# Patient Record
Sex: Female | Born: 1935 | Race: White | Hispanic: No | Smoking: Never smoker
Health system: Southern US, Community
[De-identification: ages and names within clinical notes are randomized; demographics above are authoritative.]

## PROBLEM LIST (undated history)

## (undated) DIAGNOSIS — H4010X0 Unspecified open-angle glaucoma, stage unspecified: Secondary | ICD-10-CM

## (undated) DIAGNOSIS — R5383 Other fatigue: Secondary | ICD-10-CM

## (undated) DIAGNOSIS — F319 Bipolar disorder, unspecified: Secondary | ICD-10-CM

## (undated) DIAGNOSIS — E860 Dehydration: Secondary | ICD-10-CM

## (undated) DIAGNOSIS — F419 Anxiety disorder, unspecified: Secondary | ICD-10-CM

## (undated) DIAGNOSIS — G629 Polyneuropathy, unspecified: Secondary | ICD-10-CM

## (undated) DIAGNOSIS — I119 Hypertensive heart disease without heart failure: Secondary | ICD-10-CM

## (undated) DIAGNOSIS — C73 Malignant neoplasm of thyroid gland: Secondary | ICD-10-CM

## (undated) DIAGNOSIS — F32A Depression, unspecified: Secondary | ICD-10-CM

## (undated) DIAGNOSIS — F0390 Unspecified dementia without behavioral disturbance: Secondary | ICD-10-CM

## (undated) DIAGNOSIS — D649 Anemia, unspecified: Secondary | ICD-10-CM

## (undated) DIAGNOSIS — N183 Chronic kidney disease, stage 3 unspecified: Secondary | ICD-10-CM

## (undated) DIAGNOSIS — E039 Hypothyroidism, unspecified: Secondary | ICD-10-CM

## (undated) DIAGNOSIS — M1990 Unspecified osteoarthritis, unspecified site: Secondary | ICD-10-CM

## (undated) DIAGNOSIS — K5900 Constipation, unspecified: Secondary | ICD-10-CM

## (undated) DIAGNOSIS — IMO0002 Reserved for concepts with insufficient information to code with codable children: Secondary | ICD-10-CM

## (undated) DIAGNOSIS — I10 Essential (primary) hypertension: Secondary | ICD-10-CM

## (undated) DIAGNOSIS — L8993 Pressure ulcer of unspecified site, stage 3: Secondary | ICD-10-CM

## (undated) DIAGNOSIS — R5381 Other malaise: Secondary | ICD-10-CM

## (undated) DIAGNOSIS — G309 Alzheimer's disease, unspecified: Secondary | ICD-10-CM

## (undated) DIAGNOSIS — J189 Pneumonia, unspecified organism: Secondary | ICD-10-CM

## (undated) DIAGNOSIS — F0280 Dementia in other diseases classified elsewhere without behavioral disturbance: Secondary | ICD-10-CM

## (undated) DIAGNOSIS — E782 Mixed hyperlipidemia: Secondary | ICD-10-CM

## (undated) DIAGNOSIS — F329 Major depressive disorder, single episode, unspecified: Secondary | ICD-10-CM

## (undated) DIAGNOSIS — N289 Disorder of kidney and ureter, unspecified: Secondary | ICD-10-CM

## (undated) HISTORY — DX: Depression, unspecified: F32.A

## (undated) HISTORY — PX: ABDOMINAL HYSTERECTOMY: SHX81

## (undated) HISTORY — PX: THYROIDECTOMY: SHX17

## (undated) HISTORY — DX: Reserved for concepts with insufficient information to code with codable children: IMO0002

## (undated) HISTORY — PX: RETINAL DETACHMENT SURGERY: SHX105

## (undated) HISTORY — DX: Major depressive disorder, single episode, unspecified: F32.9

## (undated) HISTORY — DX: Malignant neoplasm of thyroid gland: C73

## (undated) HISTORY — PX: FOOT SURGERY: SHX648

---

## 1997-05-17 ENCOUNTER — Ambulatory Visit (HOSPITAL_COMMUNITY): Admission: RE | Admit: 1997-05-17 | Discharge: 1997-05-18 | Payer: Self-pay | Admitting: General Surgery

## 1997-07-19 ENCOUNTER — Other Ambulatory Visit: Admission: RE | Admit: 1997-07-19 | Discharge: 1997-07-19 | Payer: Self-pay | Admitting: General Surgery

## 1997-08-23 ENCOUNTER — Ambulatory Visit (HOSPITAL_COMMUNITY): Admission: RE | Admit: 1997-08-23 | Discharge: 1997-08-24 | Payer: Self-pay | Admitting: General Surgery

## 1997-11-19 ENCOUNTER — Ambulatory Visit (HOSPITAL_COMMUNITY): Admission: RE | Admit: 1997-11-19 | Discharge: 1997-11-19 | Payer: Self-pay | Admitting: *Deleted

## 1997-11-29 ENCOUNTER — Ambulatory Visit (HOSPITAL_COMMUNITY): Admission: RE | Admit: 1997-11-29 | Discharge: 1997-11-29 | Payer: Self-pay | Admitting: *Deleted

## 1997-11-29 ENCOUNTER — Encounter: Payer: Self-pay | Admitting: *Deleted

## 1997-12-04 ENCOUNTER — Emergency Department (HOSPITAL_COMMUNITY): Admission: EM | Admit: 1997-12-04 | Discharge: 1997-12-04 | Payer: Self-pay | Admitting: Emergency Medicine

## 1998-04-13 ENCOUNTER — Other Ambulatory Visit: Admission: RE | Admit: 1998-04-13 | Discharge: 1998-04-13 | Payer: Self-pay | Admitting: Obstetrics and Gynecology

## 1999-08-09 ENCOUNTER — Inpatient Hospital Stay (HOSPITAL_COMMUNITY): Admission: EM | Admit: 1999-08-09 | Discharge: 1999-08-17 | Payer: Self-pay | Admitting: Psychiatry

## 1999-08-16 ENCOUNTER — Encounter: Payer: Self-pay | Admitting: Psychiatry

## 1999-08-17 ENCOUNTER — Inpatient Hospital Stay: Admission: RE | Admit: 1999-08-17 | Discharge: 1999-08-24 | Payer: Self-pay | Admitting: Psychiatry

## 1999-08-20 ENCOUNTER — Encounter: Payer: Self-pay | Admitting: Pediatrics

## 1999-10-12 ENCOUNTER — Other Ambulatory Visit: Admission: RE | Admit: 1999-10-12 | Discharge: 1999-10-12 | Payer: Self-pay | Admitting: Obstetrics and Gynecology

## 2001-02-28 ENCOUNTER — Other Ambulatory Visit: Admission: RE | Admit: 2001-02-28 | Discharge: 2001-02-28 | Payer: Self-pay | Admitting: Obstetrics and Gynecology

## 2001-07-02 ENCOUNTER — Ambulatory Visit (HOSPITAL_COMMUNITY): Admission: RE | Admit: 2001-07-02 | Discharge: 2001-07-02 | Payer: Self-pay | Admitting: *Deleted

## 2001-07-02 ENCOUNTER — Encounter (INDEPENDENT_AMBULATORY_CARE_PROVIDER_SITE_OTHER): Payer: Self-pay

## 2003-09-27 ENCOUNTER — Inpatient Hospital Stay (HOSPITAL_COMMUNITY): Admission: EM | Admit: 2003-09-27 | Discharge: 2003-09-29 | Payer: Self-pay | Admitting: *Deleted

## 2003-11-08 ENCOUNTER — Encounter (HOSPITAL_COMMUNITY): Admission: RE | Admit: 2003-11-08 | Discharge: 2004-02-06 | Payer: Self-pay | Admitting: Endocrinology

## 2006-01-16 ENCOUNTER — Ambulatory Visit: Payer: Self-pay | Admitting: Family Medicine

## 2006-01-23 ENCOUNTER — Ambulatory Visit: Payer: Self-pay | Admitting: Family Medicine

## 2006-01-31 ENCOUNTER — Encounter: Admission: RE | Admit: 2006-01-31 | Discharge: 2006-01-31 | Payer: Self-pay | Admitting: Family Medicine

## 2006-02-07 ENCOUNTER — Other Ambulatory Visit: Admission: RE | Admit: 2006-02-07 | Discharge: 2006-02-07 | Payer: Self-pay | Admitting: Diagnostic Radiology

## 2006-02-07 ENCOUNTER — Encounter (INDEPENDENT_AMBULATORY_CARE_PROVIDER_SITE_OTHER): Payer: Self-pay | Admitting: *Deleted

## 2006-02-07 ENCOUNTER — Encounter: Admission: RE | Admit: 2006-02-07 | Discharge: 2006-02-07 | Payer: Self-pay | Admitting: Family Medicine

## 2006-02-27 ENCOUNTER — Ambulatory Visit: Payer: Self-pay | Admitting: Family Medicine

## 2006-03-19 ENCOUNTER — Ambulatory Visit: Payer: Self-pay | Admitting: Family Medicine

## 2006-03-26 ENCOUNTER — Ambulatory Visit: Payer: Self-pay | Admitting: Family Medicine

## 2006-03-26 ENCOUNTER — Encounter: Admission: RE | Admit: 2006-03-26 | Discharge: 2006-03-26 | Payer: Self-pay | Admitting: Family Medicine

## 2006-04-22 ENCOUNTER — Ambulatory Visit: Payer: Self-pay | Admitting: Family Medicine

## 2007-09-03 ENCOUNTER — Ambulatory Visit: Payer: Self-pay | Admitting: Family Medicine

## 2007-09-08 ENCOUNTER — Ambulatory Visit: Payer: Self-pay | Admitting: Family Medicine

## 2007-09-11 ENCOUNTER — Ambulatory Visit: Payer: Self-pay | Admitting: Cardiovascular Disease

## 2008-04-13 ENCOUNTER — Emergency Department (HOSPITAL_COMMUNITY): Admission: EM | Admit: 2008-04-13 | Discharge: 2008-04-13 | Payer: Self-pay | Admitting: Emergency Medicine

## 2008-04-14 ENCOUNTER — Inpatient Hospital Stay (HOSPITAL_COMMUNITY): Admission: RE | Admit: 2008-04-14 | Discharge: 2008-04-23 | Payer: Self-pay | Admitting: *Deleted

## 2008-04-14 ENCOUNTER — Ambulatory Visit: Payer: Self-pay | Admitting: *Deleted

## 2008-05-10 ENCOUNTER — Inpatient Hospital Stay (HOSPITAL_COMMUNITY): Admission: EM | Admit: 2008-05-10 | Discharge: 2008-05-18 | Payer: Self-pay | Admitting: Emergency Medicine

## 2008-06-15 ENCOUNTER — Inpatient Hospital Stay (HOSPITAL_COMMUNITY): Admission: AD | Admit: 2008-06-15 | Discharge: 2008-06-18 | Payer: Self-pay | Admitting: Psychiatry

## 2008-06-15 ENCOUNTER — Other Ambulatory Visit: Payer: Self-pay | Admitting: Emergency Medicine

## 2008-06-15 ENCOUNTER — Ambulatory Visit: Payer: Self-pay | Admitting: Psychiatry

## 2008-06-18 ENCOUNTER — Ambulatory Visit: Payer: Self-pay | Admitting: Family Medicine

## 2008-06-28 ENCOUNTER — Inpatient Hospital Stay (HOSPITAL_COMMUNITY): Admission: AD | Admit: 2008-06-28 | Discharge: 2008-07-22 | Payer: Self-pay | Admitting: *Deleted

## 2008-10-19 ENCOUNTER — Encounter: Admission: RE | Admit: 2008-10-19 | Discharge: 2008-10-19 | Payer: Self-pay | Admitting: Family Medicine

## 2008-10-19 ENCOUNTER — Ambulatory Visit: Payer: Self-pay | Admitting: Family Medicine

## 2009-03-15 ENCOUNTER — Encounter: Admission: RE | Admit: 2009-03-15 | Discharge: 2009-03-15 | Payer: Self-pay | Admitting: Psychiatry

## 2009-03-24 ENCOUNTER — Inpatient Hospital Stay (HOSPITAL_COMMUNITY): Admission: EM | Admit: 2009-03-24 | Discharge: 2009-03-27 | Payer: Self-pay | Admitting: Emergency Medicine

## 2009-05-13 ENCOUNTER — Encounter: Admission: RE | Admit: 2009-05-13 | Discharge: 2009-05-13 | Payer: Self-pay | Admitting: Endocrinology

## 2010-01-11 ENCOUNTER — Ambulatory Visit
Admission: RE | Admit: 2010-01-11 | Discharge: 2010-01-11 | Payer: Self-pay | Source: Home / Self Care | Attending: Family Medicine | Admitting: Family Medicine

## 2010-01-29 ENCOUNTER — Encounter: Payer: Self-pay | Admitting: Emergency Medicine

## 2010-01-29 ENCOUNTER — Encounter: Payer: Self-pay | Admitting: Family Medicine

## 2010-04-02 LAB — CBC
HCT: 31.7 % — ABNORMAL LOW (ref 36.0–46.0)
HCT: 34.9 % — ABNORMAL LOW (ref 36.0–46.0)
HCT: 37.4 % (ref 36.0–46.0)
Hemoglobin: 10.6 g/dL — ABNORMAL LOW (ref 12.0–15.0)
Hemoglobin: 11.4 g/dL — ABNORMAL LOW (ref 12.0–15.0)
Hemoglobin: 12.3 g/dL (ref 12.0–15.0)
MCHC: 32.6 g/dL (ref 30.0–36.0)
MCHC: 32.8 g/dL (ref 30.0–36.0)
MCV: 88.3 fL (ref 78.0–100.0)
MCV: 88.4 fL (ref 78.0–100.0)
Platelets: 262 10*3/uL (ref 150–400)
Platelets: 289 10*3/uL (ref 150–400)
Platelets: 326 10*3/uL (ref 150–400)
RBC: 3.95 MIL/uL (ref 3.87–5.11)
RBC: 4.23 MIL/uL (ref 3.87–5.11)
RDW: 17 % — ABNORMAL HIGH (ref 11.5–15.5)
RDW: 17 % — ABNORMAL HIGH (ref 11.5–15.5)
RDW: 17.4 % — ABNORMAL HIGH (ref 11.5–15.5)

## 2010-04-02 LAB — D-DIMER, QUANTITATIVE: D-Dimer, Quant: 0.69 ug/mL-FEU — ABNORMAL HIGH (ref 0.00–0.48)

## 2010-04-02 LAB — BASIC METABOLIC PANEL
BUN: 12 mg/dL (ref 6–23)
CO2: 26 mEq/L (ref 19–32)
Calcium: 8.1 mg/dL — ABNORMAL LOW (ref 8.4–10.5)
Calcium: 8.6 mg/dL (ref 8.4–10.5)
Chloride: 111 mEq/L (ref 96–112)
Chloride: 112 mEq/L (ref 96–112)
Creatinine, Ser: 1.27 mg/dL — ABNORMAL HIGH (ref 0.4–1.2)
GFR calc Af Amer: 57 mL/min — ABNORMAL LOW (ref >60)
GFR calc non Af Amer: 41 mL/min — ABNORMAL LOW (ref >60)
GFR calc non Af Amer: 41 mL/min — ABNORMAL LOW (ref >60)
Glucose, Bld: 122 mg/dL — ABNORMAL HIGH (ref 70–99)
Glucose, Bld: 74 mg/dL (ref 70–99)
Glucose, Bld: 98 mg/dL (ref 70–99)
Potassium: 4.1 mEq/L (ref 3.5–5.1)
Sodium: 145 mEq/L (ref 135–145)
Sodium: 145 mEq/L (ref 135–145)
Sodium: 145 mEq/L (ref 135–145)

## 2010-04-02 LAB — CULTURE, BLOOD (ROUTINE X 2)

## 2010-04-02 LAB — DIFFERENTIAL
Basophils Absolute: 0 10*3/uL (ref 0.0–0.1)
Basophils Relative: 0 % (ref 0–1)
Eosinophils Absolute: 0 10*3/uL (ref 0.0–0.7)
Eosinophils Absolute: 0 10*3/uL (ref 0.0–0.7)
Monocytes Absolute: 0.7 10*3/uL (ref 0.1–1.0)
Monocytes Absolute: 0.7 10*3/uL (ref 0.1–1.0)
Monocytes Relative: 11 % (ref 3–12)
Neutrophils Relative %: 69 % (ref 43–77)

## 2010-04-02 LAB — POCT CARDIAC MARKERS
Myoglobin, poc: 121 ng/mL (ref 12–200)
Troponin i, poc: <0.05 ng/mL (ref 0.00–0.09)

## 2010-04-16 LAB — GLUCOSE, CAPILLARY
Glucose-Capillary: 103 mg/dL — ABNORMAL HIGH (ref 70–99)
Glucose-Capillary: 105 mg/dL — ABNORMAL HIGH (ref 70–99)
Glucose-Capillary: 105 mg/dL — ABNORMAL HIGH (ref 70–99)
Glucose-Capillary: 126 mg/dL — ABNORMAL HIGH (ref 70–99)
Glucose-Capillary: 82 mg/dL (ref 70–99)
Glucose-Capillary: 87 mg/dL (ref 70–99)

## 2010-04-17 LAB — URINALYSIS, ROUTINE W REFLEX MICROSCOPIC
Bilirubin Urine: NEGATIVE
Bilirubin Urine: NEGATIVE
Glucose, UA: NEGATIVE mg/dL
Glucose, UA: NEGATIVE mg/dL
Glucose, UA: NEGATIVE mg/dL
Hgb urine dipstick: NEGATIVE
Hgb urine dipstick: NEGATIVE
Ketones, ur: NEGATIVE mg/dL
Ketones, ur: NEGATIVE mg/dL
Nitrite: NEGATIVE
Protein, ur: NEGATIVE mg/dL
Protein, ur: NEGATIVE mg/dL
Protein, ur: NEGATIVE mg/dL

## 2010-04-17 LAB — ETHANOL
Alcohol, Ethyl (B): <5 mg/dL (ref 0–10)
Alcohol, Ethyl (B): <5 mg/dL (ref 0–10)

## 2010-04-17 LAB — GLUCOSE, CAPILLARY: Glucose-Capillary: 159 mg/dL — ABNORMAL HIGH (ref 70–99)

## 2010-04-17 LAB — URINE MICROSCOPIC-ADD ON

## 2010-04-17 LAB — BASIC METABOLIC PANEL
BUN: 14 mg/dL (ref 6–23)
Calcium: 10.2 mg/dL (ref 8.4–10.5)
Creatinine, Ser: 1.27 mg/dL — ABNORMAL HIGH (ref 0.4–1.2)
Creatinine, Ser: 1.16 mg/dL (ref 0.4–1.2)
GFR calc Af Amer: 50 mL/min — ABNORMAL LOW (ref >60)
GFR calc non Af Amer: 41 mL/min — ABNORMAL LOW (ref >60)
GFR calc non Af Amer: 46 mL/min — ABNORMAL LOW (ref >60)
Sodium: 141 mEq/L (ref 135–145)

## 2010-04-17 LAB — HEPATIC FUNCTION PANEL
ALT: 18 U/L (ref 0–35)
Albumin: 3.8 g/dL (ref 3.5–5.2)
Alkaline Phosphatase: 104 U/L (ref 39–117)
Bilirubin, Direct: <0.1 mg/dL (ref 0.0–0.3)
Total Bilirubin: 0.3 mg/dL (ref 0.3–1.2)
Total Protein: 7.5 g/dL (ref 6.0–8.3)

## 2010-04-17 LAB — DIFFERENTIAL
Basophils Absolute: 0 10*3/uL (ref 0.0–0.1)
Basophils Absolute: 0 10*3/uL (ref 0.0–0.1)
Basophils Relative: 1 % (ref 0–1)
Eosinophils Absolute: 0 10*3/uL (ref 0.0–0.7)
Lymphocytes Relative: 18 % (ref 12–46)
Lymphocytes Relative: 26 % (ref 12–46)
Lymphs Abs: 1.9 10*3/uL (ref 0.7–4.0)
Monocytes Absolute: 0.6 10*3/uL (ref 0.1–1.0)
Monocytes Relative: 9 % (ref 3–12)
Neutro Abs: 5.2 10*3/uL (ref 1.7–7.7)
Neutrophils Relative %: 72 % (ref 43–77)
Neutrophils Relative %: 65 % (ref 43–77)

## 2010-04-17 LAB — RAPID URINE DRUG SCREEN, HOSP PERFORMED
Amphetamines: NOT DETECTED
Barbiturates: NOT DETECTED
Benzodiazepines: POSITIVE — AB
Opiates: NOT DETECTED
Opiates: NOT DETECTED

## 2010-04-17 LAB — URINE CULTURE

## 2010-04-17 LAB — CBC
Hemoglobin: 12.1 g/dL (ref 12.0–15.0)
Platelets: 349 10*3/uL (ref 150–400)
RBC: 4.36 MIL/uL (ref 3.87–5.11)
RDW: 15.6 % — ABNORMAL HIGH (ref 11.5–15.5)
WBC: 7.5 10*3/uL (ref 4.0–10.5)

## 2010-04-17 LAB — LITHIUM LEVEL: Lithium Lvl: 0.77 mEq/L — ABNORMAL LOW (ref 0.80–1.40)

## 2010-04-17 LAB — TSH: TSH: 18.42 u[IU]/mL — ABNORMAL HIGH (ref 0.350–4.500)

## 2010-04-18 LAB — CBC
HCT: 38.6 % (ref 36.0–46.0)
Hemoglobin: 12.4 g/dL (ref 12.0–15.0)
Hemoglobin: 12.7 g/dL (ref 12.0–15.0)
Hemoglobin: 12.8 g/dL (ref 12.0–15.0)
Hemoglobin: 14.1 g/dL (ref 12.0–15.0)
MCHC: 32.8 g/dL (ref 30.0–36.0)
MCHC: 33.1 g/dL (ref 30.0–36.0)
MCHC: 33.2 g/dL (ref 30.0–36.0)
MCHC: 33.2 g/dL (ref 30.0–36.0)
MCHC: 34.2 g/dL (ref 30.0–36.0)
MCV: 87 fL (ref 78.0–100.0)
MCV: 87.3 fL (ref 78.0–100.0)
MCV: 87.3 fL (ref 78.0–100.0)
Platelets: 184 10*3/uL (ref 150–400)
Platelets: 279 10*3/uL (ref 150–400)
Platelets: 335 10*3/uL (ref 150–400)
RBC: 4.29 MIL/uL (ref 3.87–5.11)
RBC: 4.35 MIL/uL (ref 3.87–5.11)
RDW: 15.5 % (ref 11.5–15.5)
RDW: 16 % — ABNORMAL HIGH (ref 11.5–15.5)
WBC: 8.5 10*3/uL (ref 4.0–10.5)

## 2010-04-18 LAB — CALCIUM: Calcium: 9.4 mg/dL (ref 8.4–10.5)

## 2010-04-18 LAB — COMPREHENSIVE METABOLIC PANEL
ALT: 13 U/L (ref 0–35)
AST: 24 U/L (ref 0–37)
CO2: 23 mEq/L (ref 19–32)
Calcium: 9.5 mg/dL (ref 8.4–10.5)
Chloride: 111 mEq/L (ref 96–112)
GFR calc Af Amer: 54 mL/min — ABNORMAL LOW (ref >60)
GFR calc non Af Amer: 45 mL/min — ABNORMAL LOW (ref >60)
Glucose, Bld: 132 mg/dL — ABNORMAL HIGH (ref 70–99)
Sodium: 141 mEq/L (ref 135–145)
Total Bilirubin: 0.5 mg/dL (ref 0.3–1.2)

## 2010-04-18 LAB — URINALYSIS, ROUTINE W REFLEX MICROSCOPIC
Glucose, UA: NEGATIVE mg/dL
Hgb urine dipstick: NEGATIVE
Hgb urine dipstick: NEGATIVE
Protein, ur: NEGATIVE mg/dL
Specific Gravity, Urine: 1.003 — ABNORMAL LOW (ref 1.005–1.030)
Specific Gravity, Urine: 1.004 — ABNORMAL LOW (ref 1.005–1.030)
Urobilinogen, UA: 0.2 mg/dL (ref 0.0–1.0)
pH: 7 (ref 5.0–8.0)
pH: 7.5 (ref 5.0–8.0)

## 2010-04-18 LAB — URINE CULTURE
Colony Count: NO GROWTH
Special Requests: POSITIVE

## 2010-04-18 LAB — VITAMIN B12: Vitamin B-12: 400 pg/mL (ref 211–911)

## 2010-04-18 LAB — BASIC METABOLIC PANEL
BUN: 10 mg/dL (ref 6–23)
BUN: 11 mg/dL (ref 6–23)
BUN: 12 mg/dL (ref 6–23)
BUN: 7 mg/dL (ref 6–23)
BUN: 8 mg/dL (ref 6–23)
BUN: 9 mg/dL (ref 6–23)
CO2: 21 mEq/L (ref 19–32)
CO2: 21 mEq/L (ref 19–32)
CO2: 22 mEq/L (ref 19–32)
CO2: 23 mEq/L (ref 19–32)
CO2: 24 mEq/L (ref 19–32)
CO2: 25 mEq/L (ref 19–32)
Calcium: 9.1 mg/dL (ref 8.4–10.5)
Calcium: 9.5 mg/dL (ref 8.4–10.5)
Calcium: 9.6 mg/dL (ref 8.4–10.5)
Chloride: 106 mEq/L (ref 96–112)
Chloride: 117 mEq/L — ABNORMAL HIGH (ref 96–112)
Chloride: 119 mEq/L — ABNORMAL HIGH (ref 96–112)
Chloride: 123 mEq/L — ABNORMAL HIGH (ref 96–112)
Chloride: 125 mEq/L — ABNORMAL HIGH (ref 96–112)
Creatinine, Ser: 0.96 mg/dL (ref 0.4–1.2)
Creatinine, Ser: 1.01 mg/dL (ref 0.4–1.2)
Creatinine, Ser: 1.07 mg/dL (ref 0.4–1.2)
GFR calc Af Amer: >60 mL/min (ref >60)
GFR calc non Af Amer: 50 mL/min — ABNORMAL LOW (ref >60)
Glucose, Bld: 107 mg/dL — ABNORMAL HIGH (ref 70–99)
Glucose, Bld: 111 mg/dL — ABNORMAL HIGH (ref 70–99)
Glucose, Bld: 111 mg/dL — ABNORMAL HIGH (ref 70–99)
Glucose, Bld: 112 mg/dL — ABNORMAL HIGH (ref 70–99)
Glucose, Bld: 141 mg/dL — ABNORMAL HIGH (ref 70–99)
Potassium: 3.4 mEq/L — ABNORMAL LOW (ref 3.5–5.1)
Potassium: 3.7 mEq/L (ref 3.5–5.1)
Potassium: 3.9 mEq/L (ref 3.5–5.1)
Sodium: 138 mEq/L (ref 135–145)
Sodium: 144 mEq/L (ref 135–145)

## 2010-04-18 LAB — SODIUM, URINE, RANDOM: Sodium, Ur: 39 mEq/L

## 2010-04-18 LAB — DIFFERENTIAL
Basophils Absolute: 0 10*3/uL (ref 0.0–0.1)
Basophils Absolute: 0 10*3/uL (ref 0.0–0.1)
Basophils Relative: 0 % (ref 0–1)
Eosinophils Absolute: 0 10*3/uL (ref 0.0–0.7)
Eosinophils Absolute: 0 10*3/uL (ref 0.0–0.7)
Eosinophils Relative: 0 % (ref 0–5)
Eosinophils Relative: 0 % (ref 0–5)
Lymphs Abs: 1.7 10*3/uL (ref 0.7–4.0)
Monocytes Absolute: 0.4 10*3/uL (ref 0.1–1.0)
Neutrophils Relative %: 70 % (ref 43–77)

## 2010-04-18 LAB — POCT CARDIAC MARKERS: CKMB, poc: <1 ng/mL — ABNORMAL LOW (ref 1.0–8.0)

## 2010-04-18 LAB — VITAMIN D 25 HYDROXY (VIT D DEFICIENCY, FRACTURES): Vit D, 25-Hydroxy: 27 ng/mL — ABNORMAL LOW (ref 30–89)

## 2010-04-18 LAB — RAPID URINE DRUG SCREEN, HOSP PERFORMED
Barbiturates: NOT DETECTED
Cocaine: NOT DETECTED
Opiates: NOT DETECTED
Tetrahydrocannabinol: NOT DETECTED

## 2010-04-18 LAB — VITAMIN D 1,25 DIHYDROXY: Vitamin D2 1, 25 (OH)2: <8 pg/mL

## 2010-04-18 LAB — PTH, INTACT AND CALCIUM
Calcium, Total (PTH): 9.7 mg/dL (ref 8.4–10.5)
PTH: 69 pg/mL (ref 14.0–72.0)

## 2010-04-18 LAB — URINE MICROSCOPIC-ADD ON

## 2010-04-18 LAB — TSH: TSH: 2.887 u[IU]/mL (ref 0.350–4.500)

## 2010-04-18 LAB — MAGNESIUM: Magnesium: 2.5 mg/dL (ref 1.5–2.5)

## 2010-04-18 LAB — OSMOLALITY, URINE: Osmolality, Ur: 142 mOsm/kg — ABNORMAL LOW (ref 390–1090)

## 2010-04-19 LAB — COMPREHENSIVE METABOLIC PANEL
AST: 22 U/L (ref 0–37)
BUN: 5 mg/dL — ABNORMAL LOW (ref 6–23)
BUN: 8 mg/dL (ref 6–23)
CO2: 23 mEq/L (ref 19–32)
CO2: 24 mEq/L (ref 19–32)
Calcium: 9.4 mg/dL (ref 8.4–10.5)
Calcium: 9.8 mg/dL (ref 8.4–10.5)
Creatinine, Ser: 1.08 mg/dL (ref 0.4–1.2)
Creatinine, Ser: 1.24 mg/dL — ABNORMAL HIGH (ref 0.4–1.2)
GFR calc Af Amer: 51 mL/min — ABNORMAL LOW (ref >60)
GFR calc non Af Amer: 43 mL/min — ABNORMAL LOW (ref >60)
Glucose, Bld: 100 mg/dL — ABNORMAL HIGH (ref 70–99)
Total Bilirubin: 0.6 mg/dL (ref 0.3–1.2)

## 2010-04-19 LAB — RAPID URINE DRUG SCREEN, HOSP PERFORMED
Amphetamines: NOT DETECTED
Barbiturates: NOT DETECTED
Benzodiazepines: NOT DETECTED
Opiates: NOT DETECTED
Tetrahydrocannabinol: NOT DETECTED

## 2010-04-19 LAB — URINE CULTURE: Colony Count: 40000

## 2010-04-19 LAB — BRAIN NATRIURETIC PEPTIDE
Pro B Natriuretic peptide (BNP): 58 pg/mL (ref 0.0–100.0)
Pro B Natriuretic peptide (BNP): 59 pg/mL (ref 0.0–100.0)

## 2010-04-19 LAB — POCT CARDIAC MARKERS
CKMB, poc: <1 ng/mL — ABNORMAL LOW (ref 1.0–8.0)
Myoglobin, poc: 97.2 ng/mL (ref 12–200)

## 2010-04-19 LAB — CBC
HCT: 35.1 % — ABNORMAL LOW (ref 36.0–46.0)
HCT: 37 % (ref 36.0–46.0)
Hemoglobin: 12.4 g/dL (ref 12.0–15.0)
MCHC: 33.6 g/dL (ref 30.0–36.0)
MCHC: 33.6 g/dL (ref 30.0–36.0)
MCV: 85.6 fL (ref 78.0–100.0)
MCV: 86.2 fL (ref 78.0–100.0)
RBC: 4.07 MIL/uL (ref 3.87–5.11)
RBC: 4.32 MIL/uL (ref 3.87–5.11)

## 2010-04-19 LAB — URINALYSIS, ROUTINE W REFLEX MICROSCOPIC
Bilirubin Urine: NEGATIVE
Ketones, ur: NEGATIVE mg/dL
Nitrite: NEGATIVE
Protein, ur: NEGATIVE mg/dL

## 2010-04-19 LAB — POCT I-STAT 3, ART BLOOD GAS (G3+)
Acid-Base Excess: 2 mmol/L (ref 0.0–2.0)
O2 Saturation: 98 %
TCO2: 24 mmol/L (ref 0–100)
pO2, Arterial: 85 mmHg (ref 80.0–100.0)

## 2010-04-19 LAB — DIFFERENTIAL
Basophils Absolute: 0 10*3/uL (ref 0.0–0.1)
Eosinophils Relative: 0 % (ref 0–5)
Lymphocytes Relative: 26 % (ref 12–46)
Lymphocytes Relative: 26 % (ref 12–46)
Lymphs Abs: 1.2 10*3/uL (ref 0.7–4.0)
Lymphs Abs: 1.2 10*3/uL (ref 0.7–4.0)
Neutro Abs: 2.9 10*3/uL (ref 1.7–7.7)
Neutrophils Relative %: 64 % (ref 43–77)
Neutrophils Relative %: 65 % (ref 43–77)

## 2010-04-19 LAB — D-DIMER, QUANTITATIVE
D-Dimer, Quant: 0.56 ug/mL-FEU — ABNORMAL HIGH (ref 0.00–0.48)
D-Dimer, Quant: 0.72 ug/mL-FEU — ABNORMAL HIGH (ref 0.00–0.48)

## 2010-04-19 LAB — LITHIUM LEVEL: Lithium Lvl: 0.53 mEq/L — ABNORMAL LOW (ref 0.80–1.40)

## 2010-04-19 LAB — SALICYLATE LEVEL: Salicylate Lvl: <4 mg/dL (ref 2.8–20.0)

## 2010-05-12 ENCOUNTER — Encounter: Payer: Self-pay | Admitting: Family Medicine

## 2010-05-12 DIAGNOSIS — K1120 Sialoadenitis, unspecified: Secondary | ICD-10-CM

## 2010-05-12 DIAGNOSIS — R591 Generalized enlarged lymph nodes: Secondary | ICD-10-CM

## 2010-05-23 NOTE — H&P (Signed)
[name]                ACCOUNT NO.:  [id number]   MEDICAL RECORD NO.:  [id number]          PATIENT TYPE:  IPS   LOCATION:  0500                          FACILITY:  [facility]   PHYSICIAN:  Geoffery Lyons, M.D.      DATE OF BIRTH:  07-Oct-1934   DATE OF ADMISSION:  04/14/2008  DATE OF DISCHARGE:                       PSYCHIATRIC ADMISSION ASSESSMENT   TIME OF EVALUATION:  9:30 a.m.   IDENTIFICATION:  This is a 76 year old female.  This is a voluntary  admission.   HISTORY OF PRESENT ILLNESS:  First [facility] admission for this 76 year old  who presented two times sequentially in the emergency room complaining  of being unable to breathe, displaying a lot of anxious mood and  postures and having difficulty speaking.  She reports increasing anxiety  and being very upset since her daughter has locked her twin sons out  of the home.  She reports having two twin sons that have a history of  problems of getting into trouble because of drugs.  One of them is  currently in jail and the other one has been locked out of the house  because both of them were stealing things from the home.  She reports  that they have taken knives and taken the gutters off the house in order  to sell things for money.  This has left her living alone.  She does not  feel that she can survive alone and is upset that her sons cannot get  back in the house.  She reports that her one son has been staying in the  garage.  She admits that her anxiety is causing her some confusion and  feeling tense, unable to eat or sleep properly and admits that she  cannot remember some details such as her sons' ages today.  She has two  twin sons, one daughter living out of town and her one daughter here in  town has been attempting to manage her affairs.  She denies any suicidal  or homicidal thought and admits that she so anxious she cannot breathe,  stopping and sighing frequently during discussion.   PAST PSYCHIATRIC HISTORY:  First  [facility] admission.  Currently followed by  Dr. Meredith Staggers here in [place] who is her primary psychiatrist.  She has a history of bipolar one disorder with a significant history of  mania and psychosis in the distant past and has been admitted to [facility].  No prior psychiatric admissions at [facility].  She has  apparently been quite stable on her current medications with which she  is compliant.  She has a history of an onset of bipolar disorder with  mania approximately 20 years ago  and at one point was treated with both  lithium and Nardil for 15 years.   SOCIAL HISTORY:  A single female who has been living here in [place]  with the support of her daughter, has a couple of grandchildren, another  daughter living out of town and two twin sons with apparent substance  abuse issues.   MEDICAL HISTORY:  Her primary care physician is unclear.  CURRENT MEDICAL PROBLEMS:  Hypothyroidism status post thyroid cancer  with ablation.   PAST MEDICAL HISTORY:  Significant for hysterectomy and history of  lithium toxicity.  No history of seizures.  History of bilateral  cataracts with iridectomies.  Also has a history of osteoarthritis and  detached retina.  History of neuropathy in her feet with reconstructive  surgery.   CURRENT MEDICATIONS:  These include Zocor 80 mg daily, Protonix 40 mg  daily, lithium carbonate 450 mg p.o. q.h.s., Levothroid 100 mcg daily  and Lamictal 200 mg p.o. q.h.s. and Symbyax 6/25 mg p.o. q.h.s.   She had been admitted to [facility] on two previous occasions  and previous history to [facility] in the distant past and at [facility] in the past.  In addition to being managed in the  past on Zyprexa, Effexor, desipramine and Remeron she has also been on  Wellbutrin and Risperdal.   POSITIVE PHYSICAL FINDINGS:  Physical exam was done in the emergency  room.  Diagnostic studies were remarkable for a beta natriuretic  peptide  which was within normal limits.  Her lithium level was 0.53.  Liver  enzymes normal.  CBC unremarkable.  BUN 8 and creatinine 1.24 and  routine urinalysis with specific gravity of 1.007.  No cells, glucose,  blood or protein, ketones or leukocytes.   MENTAL STATUS EXAM:  Fully alert female, quite anxious, dressed in a  hospital gown, groomed.  Intense stare, appears quite anxious with a  rather rigid posture, tensing and relaxing her hands.  Speech is  somewhat high-pitched, slightly pressured but she is able to attend  conversation, looks at [place], repeats her names when introduced.  Responses  are initially minimal words with kind of a choppy cadence but then as  she relaxes with the conversation her speech becomes more fluent.  She  is able to give a minimal history repeating her concerns several times  that she is so worried about her sons, what will happen, what will  happen.  Mood is anxious and tense.  No voicing of suicidal or homicidal  thoughts.  She is cooperative, asking for help.  She is primarily  concerned about what will happen with her sons since they cannot come  home.  She is oriented to person and basic situation.  Thought process  reveals thought blocking.  No evidence of hallucinations or internal  distractions.   AXIS I:  Acute adjustment reaction with underlying mood disorder and  bipolar one disorder NOS.   AXIS II:  No diagnosis.   AXIS III:  Hypothyroidism, osteoarthritis with previous surgical repair  of her feet.   AXIS IV:  Severe issues with a family changes and disruption in living  situation, having a supportive daughter as an asset.   AXIS V:  Current 38, past year not known.   PLAN:  The plan is to voluntarily admit her to stabilize her mood and  assist her and her family with stabilizing the home situation and  getting her adequate support.  We are going to check a TSH, T4 and free  T3 and will recheck a BMET on her.  We are forcing  fluids.  She is on  one-to-one observation for safety due to her unsteady gait and her  daughter is going to bring her cane in for ambulation and we are going  to continue her current medications and have had a phone conference with  her primary psychiatrist for coordination.  Margaret A. Scott, N.P.      Geoffery Lyons, M.D.  Electronically Signed    MAS/MEDQ  D:  04/15/2008  T:  04/15/2008  Job:  [id number]

## 2010-05-23 NOTE — Discharge Summary (Signed)
[name], ALVI                ACCOUNT NO.:  [id number]   MEDICAL RECORD NO.:  [id number]          PATIENT TYPE:  IPS   LOCATION:  0500                          FACILITY:  [facility]   PHYSICIAN:  Geoffery Lyons, M.D.      DATE OF BIRTH:  1934/03/27   DATE OF ADMISSION:  04/14/2008  DATE OF DISCHARGE:  04/23/2008                               DISCHARGE SUMMARY   CHIEF COMPLAINT AND PRESENT ILLNESS:  This was the first admission to  [facility] for this 76 year old female voluntarily  admitted.  Apparently, she presented twice in the emergency room  complaining of being unable to breathe displaying a lot of anxious mood  and postures and having difficulty speaking.  She reported increasing  anxiety and being very upset since her daughter has locked her twin sons  out of the house.  She has twin sons that have a history of problems of  getting into trouble because of drugs, one of them is in jail and the  other one was locked out of the house because they were both stealing  from her to sell things for drugs.  Without the sons there, she is  alone, did not feel that she could survive alone, upset because they  cannot get back in the house.  One of them was staying in the garage.  She has been having some anxiety, confusion, unable to eat or sleep.  Admits she gets so warm she cannot breathe.   PAST PSYCHIATRIC HISTORY:  First time at [facility].  She is  being followed by Dr. Meredith Staggers.  History of bipolar disorder.  Significant history for mania and psychosis in the distant past, has  been in [employer].  She has been stable on her medication and she  is compliant.  History of onset of bipolar disorder with mania 20 years  ago and was treated with lithium and Nardil.   ALCOHOL AND DRUG HISTORY:  Denies active use of any substances.   MEDICAL HISTORY:  1. Osteoarthritis.  2. Detached retina.  3. Neuropathy.  4. Reconstructive surgery.   MEDICATIONS:  1. Zocor 80 mg per day.  2. Protonix 40 mg per day.  3. Lithium 450 at night.  4. Levothroid 100 mcg daily.  5. Lamictal 200 mg at bedtime.  6. Symbyax 6/25 at bedtime.   In the past, she has used Zyprexa, Effexor, desipramine, Remeron, as  well as Wellbutrin and Risperdal.   PHYSICAL EXAM:  Failed to show any acute findings.   LABORATORY WORKUP:  BUN 8, creatinine 1.24, lithium level was 0.53.  Liver enzymes within normal limits.   MENTAL STATUS EXAM:  Reveals alert, cooperative female.  Quite anxious,  intense stare, rather rigid posture, tensing and relaxing her hands.  Speech is somewhat high pitched, slightly pressured but she is able to  attend to the conversation.  Initially responses are minimal, ruminates  about what is going on with her sons and her daughter having locked them  out.  Mood anxious.  Affect anxious.  Endorsed no active suicidal or  homicidal  ideas.  No evidence of delusions or hallucinations.  Cognition  actively affected by the acute process.   ADMITTING DIAGNOSES:  AXIS I:  1. Bipolar disorder.  2. Anxiety disorder, not otherwise specified.  AXIS II:  No diagnosis.  AXIS III:  1. Hypothyroidism.  2. Osteoarthritis.  3. Neuropathy.  4. Status post surgical repair of her feet.  AXIS IV:  Moderate.  AXIS V:  Upon admission, 35.  Her Global Assessment of Functioning in  the last year of 60.   COURSE IN THE HOSPITAL:  She was admitted, started individual and group  psychotherapy.  Through her hospitalization, she was maintained on one-  on-one observation due to her level of confusion and high risk for  falling.  As already stated, this is a 76 year old female with  longstanding history of bipolar disorder, maintenance treatment with  lithium and Symbyax.  Got very upset when herbaby twin children in  their 75s got in trouble, one of them is in jail and the other one  kicked out of the house.  Continued to endorse that she was distressed  by the fact  that the sons were going through this situation.  She was  tearful through the interview, worried, concerned about the children.  We communicated with Dr. Jennelle Human that mostly was able to ratify the fact  that she has a long history of bipolarity and he has worked on her  medications and she seems to have done the best on the combination that  she was requesting not a major change but for [place] to work with the  lithium and the Symbyax.  We continued to work on adjusting the dosage  of medication.  We had to reassure her over and over again that her  daughter would not do anything that would result in her son being hurt  but that the daughter was trying to protect her as they were stealing  from her.  She continued to evidence the rumination, tearful when  talking about the children, worried about what is going to happen with  them.  The daughter was able to come and reassure her.  She exhibited  very short attention span, as well as decreased recent memory,  forgetting that she had spoken with the daughter in the morning.  April 19, 2008, she was less tearful.  By then she was indeed remembering that  the daughter had come to visit her.  Daughter felt she was at her  baseline.  We got a PT/OT assessment as we anticipated being discharged.  April 20, 2008, she endorsed that she felt she was ready to go home but  she understands why we want to be sure that she is really stable before  we release her.  There was a change in affect.  She became more at ease.  We could see some broadening of the affect, able to smile, less  ruminative and on April 23, 2008, she was markedly improved.  No active  suicidal/homicidal ideas.  No hallucinations or delusions.  Marked  improvement in the anxiety.  The daughter was going to be available to  her and they had taken some other measures to address the issues with  the twin children.   DISCHARGE DIAGNOSES:  AXIS I:  1. Bipolar disorder.  2. Anxiety  disorder, not otherwise specified.  AXIS II:  No diagnosis.  AXIS III:  1. Hypothyroidism.  2. Osteoarthritis.  3. Status post surgical repair of her feet.  4.  Neuropathy.  AXIS IV:  Moderate.  AXIS V:  Upon discharge, 50.   Discharged on:  1. Lamictal 200 mg at bedtime.  2. Levothyroxine 100 mcg per day.  3. Lithium 450 mg at night.  4. Protonix 40 mg per day.  5. Simvastatin 80 mg per day.  6. Symbyax 6/25 mg one at bedtime.   FOLLOWUP:  Dr. Meredith Staggers at [facility]      Geoffery Lyons, M.D.  Electronically Signed     IL/MEDQ  D:  05/14/2008  T:  05/14/2008  Job:  [id number]

## 2010-05-23 NOTE — H&P (Signed)
[name], [name]                ACCOUNT NO.:  [id number]   MEDICAL RECORD NO.:  [id number]          PATIENT TYPE:  EMS   LOCATION:  ED                           FACILITY:  [facility]   PHYSICIAN:  Pedro Earls, MD     DATE OF BIRTH:  23-Feb-1934   DATE OF ADMISSION:  05/09/2008  DATE OF DISCHARGE:                              HISTORY & PHYSICAL   PRIMARY CARE PHYSICIAN:  Sharlot Gowda, M.D.   CHIEF COMPLAINT:  Failure to thrive.   HISTORY OF PRESENT ILLNESS:  This is a 76 year old white female patient  who was brought into the [facility] emergency room on May 09, 2008  around 5:00 p.m.  Since then patient had been in the ER for chief  complaint of depression.  The patient is not very cooperative and is  pleasantly confused and is not able to provide with recent history.  Most of the history was obtained after talking to the ER physician as  well as discussing with the nursing staff and looking at the record from  the ED.  Apparently the patient was having some anxiety and some  shortness of breath.  The patient had recently been more anxious and  confused and she usually lives by herself, but at  this point she has  not been eating at home, eating well or drinking any fluids and is not  able to take care of herself at home.   REVIEW OF SYSTEMS:  As above.  Rest of the review of systems were  negative.   PAST MEDICAL HISTORY:  1. Depression.  2. Bipolar disorder.  3. Hyperlipidemia.  4. Hypothyroidism.  5. Hypertension.   FAMILY HISTORY:  Noncontributory.   SURGICAL HISTORY:  History of hysterectomy and thyroid ablation surgery.   SOCIAL HISTORY:  Lives at home.  No smoking, alcohol, IV drug abuse.   MEDICATION:  1. Lamictal 200 mg p.o. daily.  2. Levothyroxine 100 mcg daily.  3. Pantoprazole 40 mg daily.  4. Lithium 450 nightly.  5. Simvastatin 40 nightly.  6. Symbyax 12/25 nightly.  7. Silver topical p.r.n.   ALLERGIES:  SULFA AND PENICILLIN.   PHYSICAL EXAM:   VITALS:  Temperature is 98.1, respirations 16-22, pulse  is 71-81, blood pressure 144/61, pulse ox 97% on room air.  The patient  is awake, alert, disoriented time, place and person.  Does not appear to  be in acute distress.  HEENT::  Pupils equal, round and reactive to light.  No icterus.  No  pallor.  Extraocular movements are intact.  Oral mucosa is dry.  NECK:  Supple.  No JVD.  No lymphadenopathy.  CV:  S1, S2, regular.  Systolic ejection murmur right second intercostal  space grade 3/6.  CHEST:  Clear.  ABDOMEN:  Soft, obese, nontender.  Bowel sounds present.  No  hepatosplenomegaly.  EXTREMITIES:  Peripheral pulses present.  No clubbing, cyanosis or  edema.  CNS:  Sensorimotor grossly intact.  Cranial nerves II-XII are intact.  SKIN:  No rashes.  MUSCULOSKELETAL:  Unremarkable.   The patient's EKG:  Normal sinus rhythm.  No  acute ST-T wave changes  were seen.  Chest x-ray, PA/lateral did not reveal any acute cardiopulmonary  abnormalities.  X-ray bilateral hip with pelvis, four views:  Revealed mild osteopenia.  No evidence of hip fracture.  Moderate stool within the ascending colon.   LABORATORIES:  TSH 2.88, Lithium 0.44.  Alcohol level less than 5.0,  creatinine 1.21, BUN 10, calcium is 10.8, troponin less than 0.05.  UA  was positive for 3-6 WBCs with a small leuk esterase.   IMPRESSION:  1. Failure to thrive.  2. Acute renal insufficiency.  3. Dehydration.  4. Mild hypercalcemia.  5. Mild urinary tract infection.  6. Change in mental status.  7. Depression.  8. Bipolar disorder.  9. Questionable dementia.   PLAN:  1. Admit to medical surgery, intravenous fluids.  Start the patient on      Avelox 400 daily for mild urinary tract infection.  2. Follow up urine cultures.  Continue fluids for hypercalcemia as      well.  We will repeat calcium levels in a.m.,      possible secondary to dehydration.  3. Please consider Psychiatry evaluation.  We will also consult  OT and      PT as well as case management since patient might need a long-term      placement at this time.      Pedro Earls, MD  Electronically Signed     NS/MEDQ  D:  05/10/2008  T:  05/10/2008  Job:  [id number]   cc:   Nelly Rout, MD   Sharlot Gowda, M.D.  Fax: [phone]

## 2010-05-23 NOTE — H&P (Signed)
NAMEROBERTHA, [name]                ACCOUNT NO.:  [id number]   MEDICAL RECORD NO.:  [id number]          PATIENT TYPE:  IPS   LOCATION:  0300                          FACILITY:  [facility]   PHYSICIAN:  Jasmine Pang, M.D. DATE OF BIRTH:  [birth year]   DATE OF ADMISSION:  06/28/2008  DATE OF DISCHARGE:                       PSYCHIATRIC ADMISSION ASSESSMENT   This is a 76 year old divorced white female.  Apparently, she was seen  at about 3:45 a.m. on June 26, 2008.  She stated she saw her brother and  his wife hovering over her staring at her.  It is unclear exactly how  she arrived at the emergency department.  The patient reports that they  were just staring at her.  They would not say anything.  EMS reports  that the patient lives alone, and no one else was in the house at the  time of arrival.  Patient has pinpoint pupils and a glassy appearance.  Patient reports that her daughter helps her with her medicines and  checks in on her.  She has multiple bruises and scratches to upper  bilateral extremities.  The patient was just discharged from a hospital  stay here June 15, 2008, to June 18, 2008.  She had been in the Geriatric  Psychiatric Unit at [facility] prior to that and had been discharged  about 3 days prior to the June 15, 2008, admission.  She was also with [place]  April 14, 2008, to April 23, 2008.  Her family came in for a family  session at the time of discharge on June 18, 2008.  They agreed to plan  on something for her in terms of assisted living or staying with one of  her children.  The family members were told that she could not stay by  herself any more as she was too confused.  She was discharged on June 18, 2008, as she had no suicidal or homicidal ideation.  There was no  evidence of frank psychosis or thought disorder.  Her sleep and appetite  were good, and, hence, she was discharged to the family with an upcoming  psychiatric appointment with Dr. Meredith Staggers on June 23, 2008.   PAST PSYCHIATRIC HISTORY:  As already stated, she has had a number of  admissions since April 2010.  She has a significant history for bipolar  with the onset of mania approximately 20 years ago.  She has been  treated with lithium for many years and at one point was on Nardil.  She  had stayed at [facility] June 13, 2008.  She had had a  stay there from May 18, 2008, to June 11, 2008, when she was admitted at  that time with dysphoric mania, dementia and failure to thrive, also  neglect of self.   SOCIAL HISTORY:  She is a 76 year old female who lives in her own home.  Significant stressor has been twin sons, one being incarcerated, the  other evicted from his home for substance abuse issues. According to  another family member, they actually had destroyed the home removing  copper  guttering and other items from the home in order to sell them for  money to obtain drugs.  She does have a supportive daughter and son who  are available to help with care and are considering possible assisted  living placement.   MEDICAL HISTORY:  She has a primary care physician, Dr. Reather Littler.  Medical problems are hypothyroidism and dyslipidemia.  Past medical  history was significant for an episode last month of diabetes insipidus  that was brought on by the lithium, and, at that time, the lithium was  discontinued.   She has no known alcohol or drug history.   DISCHARGE MEDICATIONS:  At the time of discharge on June 18, 2008:  1. Zyprexa 15 mg at bedtime.  2. Prozac 20 mg p.o. daily.  3. Zocor 80 mg p.o. daily.  4. Levothyroxine 100 mcg daily.  5. Protonix 40 mg p.o. daily.  6. Lamictal 200 mg at bedtime.  7. Xanax 0.25 mg q.a.m., 3:00 p.m. and at bedtime.  8. Lithium carbonate 300 mg b.i.d. with medications.   Her TSH was elevated at 18.4.  She was to see her family doctor for  review of TSH in about 4 weeks.   POSITIVE PHYSICAL FINDINGS:  She was medically cleared in  the ED at  [facility].  She was noted to have a UTI.  She had a moderate amount of  leukocyte esterase in her urine.  Her glucose was elevated at 159.  Her  lithium was 0.77.  Her creatinine is a little elevated at 1.27, and she  did have benzodiazepine in her urine, but she is prescribed.   Vital signs on admission to our unit show that her temperature was  ranging from 97.5 up to 98.4.  Her pulse ranged from 63 to 80.  Her  respirations were 16 to 20.  Blood pressure was 103/67 to 139/73.   MENTAL STATUS EXAMINATION:  She is alert.  She is oriented x1.  Her  speech is somewhat halting.  Her mood is anxiously depressed.  Her  affect is congruent.  Her thought processes are not clear, rational or  goal oriented.  Judgment and insight are poor.  Concentration and memory  are poor.  Intelligence is below average at this point.   DIAGNOSES:  AXIS I:  Dementia.  History for bipolar, type 1.  She  recently reported visual hallucinations.  However, she is not reporting  that at this time.  AXIS II:  None.  AXIS III:  Hypothyroidism.  Dyslipidemia.  AXIS IV:  Severe.  She is unable to live independently.  Burden of  psychiatric illness.  Burden of medical problems.  AXIS V:  Global Assessment of Functioning was 45 upon admission, and her  highest Global Assessment of Functioning in the past year was maybe 55.   PLAN:  Admit for safety and stabilization, to continue to treat her UTI.  We will have to consult with adult protective services regarding  placement as the patient cannot take care of herself.  Estimated length  of stay is 3-5 days.      Mickie Leonarda Salon, P.A.-C.      Jasmine Pang, M.D.  Electronically Signed    MD/MEDQ  D:  06/29/2008  T:  06/29/2008  Job:  [id number]

## 2010-05-23 NOTE — Discharge Summary (Signed)
[name], [name]                ACCOUNT NO.:  [id number]   MEDICAL RECORD NO.:  [id number]          PATIENT TYPE:  INP   LOCATION:  1502                         FACILITY:  [facility]   PHYSICIAN:  Hillery Aldo, M.D.   DATE OF BIRTH:  1934-05-08   DATE OF ADMISSION:  05/09/2008  DATE OF DISCHARGE:  05/12/2008                               DISCHARGE SUMMARY   PRIMARY CARE PHYSICIAN:  Dr. Sharlot Gowda.   DISCHARGE DIAGNOSES:  1. Major depression.  2. Bipolar disorder.  3. Metabolic encephalopathy/delirium.  4. Acute renal failure in the setting of stage 3 chronic kidney      disease.  5. Transient hypercalcemia.  6. Hypothyroidism.  7. Dyslipidemia.  8. Urinary tract infection, cultures pending.   DISCHARGE MEDICATIONS:  1. Lamictal 200 mg p.o. q.h.s.  2. Levothyroxine 100 mcg p.o. daily.  3. Lithium carbonate 450 mg p.o. q.h.s.  4. Avelox 400 mg p.o. daily through today.  5. Symbyax 1 capsule p.o. q.h.s.  6. Protonix 40 mg p.o. daily.  7. Simvastatin 40 mg p.o. q.h.s.   CONSULTATIONS:  Dr. Jeanie Sewer of Psychiatry.   BRIEF ADMISSION HPI:  Patient is a 76 year old female who was brought to  the emergency department for evaluation of depression.  She also had  increasing confusion accompanied by anxiety and dyspnea.  Patient has  not been eating or drinking and family felt that she was no longer able  to adequately care for herself and brought her to the emergency  department for evaluation.  For full details, please see the dictated  report done by Dr. Dayton Callas.   PROCEDURES AND DIAGNOSTIC STUDIES:  1. Chest x-ray on May 09, 2008, showed no acute cardiopulmonary      abnormality.  2. Bilateral hip and pelvis films done on May 10, 2008, showed no      findings for hip fracture.  Mild osteopenia.  Moderate stool within      the ascending colon.  3. Left ankle films on May 10, 2008, showed no acute abnormality.      Extensive fusion of the subtalar joint and hindfoot.  Status  post      anterior indication.  Lucency surrounding the medial screw      suggesting loosening.   DISCHARGE LABORATORY VALUES:  Parathyroid hormone level was 69.  Sodium  was 143, potassium 4.1, chloride 117, bicarb 23, BUN 8, creatinine 1.01,  glucose 104, calcium 9.1.  White blood cell count was 5.9, hemoglobin  12.4, hematocrit 37.5, platelets 184.  TSH was 2.887.  RBC folate, RPR,  vitamin B12, urine cultures, and vitamin D levels are all pending at the  time of this dictation.   HOSPITAL COURSE BY PROBLEM:  1. Major depression in the setting of known history of bipolar      disorder:  Patient was maintained on her usual psychotropic      medications and psychiatry consultation was requested and kindly      provided by Dr. Jeanie Sewer.  It is recommended that the patient be      transferred to an inpatient psychiatric unit for  ongoing care of      her underlying psychiatric illness.  2. Metabolic encephalopathy/delirium:  This may be due to      hypercalcemia versus urinary tract infection.  The patient's      calcium has normalized.  Her PTH is within normal limits.  Urine      cultures are pending which she will complete 3 days of Avelox      treatment today.  3. Acute renal failure in the setting of stage 3 chronic kidney      disease:  Patient had an approximate 20% rise in her creatinine      consistent with acute renal failure.  With hydration, her      creatinine function has improved.  She has underlying stage 3      chronic kidney disease.  4. Hypercalcemia:  This may have been due to acute renal failure.  It      appears to have resolved.  She did not have any evidence of      parathyroid dysfunction.  5. Hypothyroidism:  Patient has been appropriately replaced as      evidenced by her normal TSH.  6. Dyslipidemia:  Patient was maintained on her usual dose of statin      therapy.  7. Urinary tract infection:  Final cultures are pending.  Patient did      have  bacteruria and pyuria on admission.  She was empirically put      on Avelox and will complete 3 days of therapy today.  Cultures are      pending at the time of this dictation.   DISPOSITION:  Patient is medically stable and will be transferred to an  inpatient psychiatric facility when a bed is identified.   TIME SPENT COORDINATING CARE FOR DISCHARGE AND DISCHARGE INSTRUCTIONS:  Equals 35 minutes.      Hillery Aldo, M.D.  Electronically Signed     CR/MEDQ  D:  05/12/2008  T:  05/12/2008  Job:  [id number]   cc:   Sharlot Gowda, M.D.  Fax: [phone]

## 2010-05-23 NOTE — Discharge Summary (Signed)
[name], [name]                ACCOUNT NO.:  [id number]   MEDICAL RECORD NO.:  [id number]          PATIENT TYPE:  INP   LOCATION:  1502                         FACILITY:  [facility]   PHYSICIAN:  Hillery Aldo, M.D.   DATE OF BIRTH:  11/20/1934   DATE OF ADMISSION:  05/09/2008  DATE OF DISCHARGE:  05/17/2008                               DISCHARGE SUMMARY   ADDENDUM.   PRIMARY CARE PHYSICIAN:  Dr. Sharlot Gowda.   For a complete list of the discharge diagnoses, discharge medications,  consultations, brief admission HPI, procedures and diagnostic studies,  and hospital course through May 12, 2008, please see my previously  dictated discharge summary.   ADDITIONAL DISCHARGE DIAGNOSES:  1. Hypokalemia.  2. Hypernatremia.  3. 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  4. Vitamin D deficiency.   CHANGES TO DISCHARGE MEDICATIONS:  Lithium has been discontinued.  All  other medications remain as dictated on the prior discharge summary.  Vitamin D3 400 units p.o. b.i.d. has also been added.   CONSULTATION:  Dr. Jeanie Sewer of Psychiatry.   ADDITIONAL PROCEDURES AND DIAGNOSTIC STUDIES:  A KUB on May 13, 2008,  showed a large fecal burden with a nonobstructive bowel gas pattern and  no acute abnormalities.   DISCHARGE LABORATORY VALUES:  Urine osmolality was 142.  Ionized calcium  was 1.28.  Sodium was 151, potassium 3.7, chloride 123, bicarb 23, BUN  7, creatinine 0.90, glucose 111, calcium 9.3.   REMAINDER OF HOSPITAL COURSE BY PROBLEM:  1. Major depression/bipolar disorder:  The patient has remained      depressed and is intermittently tearful.  Unfortunately, lithium      had to be discontinued secondary to the development of diabetes      insipidus.  The patient needs further mood stabilization and the      plan is to send her to the [facility] today      pending bed availability.  2. 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  Lithium has      been discontinued and it is expected that her electrolytes will      normalize over the next several weeks.  The patient should be      monitored closely with regard to her electrolytes and be provided      with ample opportunity to hydrate.  Her thirst mechanism is intact      and she drinks copious amounts of water which should correct her      hyponatremia.  3. Hypokalemia:  The patient was appropriately repleted.  4. Hypothyroidism:  The patient is appropriately replaced on her      current dose of Synthroid.   DISPOSITION:  The patient is medically stable and will be transferred to  the [facility] when a bed is confirmed.   TIME SPENT COORDINATING CARE FOR DISCHARGE AND DISCHARGE INSTRUCTIONS:  35 minutes.      Hillery Aldo, M.D.  Electronically Signed     CR/MEDQ  D:  05/17/2008  T:  05/17/2008  Job:  [id number]   cc:   Sharlot Gowda, M.D.  Fax: [phone]

## 2010-05-23 NOTE — H&P (Signed)
[name], [name] NO.:  [id number]   MEDICAL RECORD NO.:  [id number]          PATIENT TYPE:  IPS   LOCATION:  0300                          FACILITY:  [facility]   PHYSICIAN:  Geoffery Lyons, M.D.      DATE OF BIRTH:  Dec 20, 1934   DATE OF ADMISSION:  06/15/2008  DATE OF DISCHARGE:                       PSYCHIATRIC ADMISSION ASSESSMENT   IDENTIFYING INFORMATION:  [facility] admission for this 76 year old  female who was discharged from the Psychiatric Unit at [facility] about 3 days prior to admission, was taken home by her  family where she lives alone and was found on the morning of admission  to be disheveled, dirty, and a bit confused.  They were concerned that  she was over-using her medications or not taking them appropriately.  She was brought to the emergency room where she did appear to be  somewhat confused.  She is calm and cooperative today, very poor  insight, appears to be at her baseline.  No signs of agitation.  No  dangerous thinking.  No flight of ideas today, focused and cooperative,  having a conversation with the aide who is helping her.   PAST PSYCHIATRIC HISTORY:  This 76 year old female is followed by Dr.  Archer Asa as an outpatient for bipolar disorder.  She has a history  of bipolar disorder, type 1, with a significant past history of mania  and psychosis with more than one admission to [facility] in  [place], [place].  She had an onset of mania approximately 20  years ago.  Has been treated with lithium for many years and at one  point was on Nardil.  She also has a history of dependent personality  features.  She was discharged from [facility] on June 13, 2008, after a previous admission there after staying there from May 18, 2008, to June 11, 2008.  She was admitted at that time for bipolar 1 with  dysphoric mania, dementia, and failure to thrive, not eating or bathing.   SOCIAL  HISTORY:  Single 76 year old female who lives in her own home.  Significant stressor has been twin sons, one being incarcerated and the  other evicted from the home for substance abuse issues.  According to  another family member, they actually had destroyed the home removing  copper guttering and other items from the home in order to sell them for  money to obtain drugs.  She has a supportive daughter and son who are  available to help with her care and are considering possible assisted  living placement.   FAMILY HISTORY:  Not available.   MEDICAL HISTORY:  Primary care physician is Dr. Reather Littler.  Medical  problems are hypothyroidism and dyslipidemia.  Past medical history  significant for an episode treated last month of diabetes insipidus  thought brought on by the lithium and at that time lithium was  discontinued.   CURRENT MEDICATIONS:  1. Tylenol 650 mg p.o. by mouth at bedtime.  2. Xanax 0.25 mg t.i.d.  3. Prozac 40 mg daily.  4. Zyprexa  15 mg at bedtime.  5. Selsun Shampoo as needed.  6. Lamictal 200 mg b.i.d.  7. Levothyroxine 100 mcg daily.  8. Lithium carbonate 300 mg 2 times a days.  9. Symbyax 12/25 mg was discontinued.  10.Protonix 40 mg daily.  11.Simvastatin 40 mg daily at 5:00 p.m.   ALLERGIES:  PENICILLIN and SULFA.   DIAGNOSTIC STUDIES:  Done in the emergency room were remarkable for  urine drug screen positive for benzodiazepines.  TSH 18.42.  Lithium  level 0.77.  Basic metabolic panel revealed sodium 142, potassium 3.9,  chloride 105, carbon dioxide 27, BUN 14, creatinine 1.16.   MENTAL STATUS EXAMINATION:  Fully alert female with an anxious affect.  Oriented to person, place and situation.  Some recent memory impairment,  not exactly sure what day she was discharged from the hospital, does not  remember details about her medications, asks [place] to call her family but  no dangerous thoughts.  Speech is generally normal in form.  Mood  neutral.  Thought  process generally logic.  Her requests are  appropriate, concerned about basic physical needs today.  No dangerous  thinking.  Cognition is limited by some memory impairment, generally  oriented.   AXIS I:  History of altered mental status, not otherwise specified.  Bipolar disorder, type 1.  History of dementia.  AXIS II:  Deferred.  AXIS III:  Hypothyroidism.  Dyslipidemia.  AXIS IV:  Severe issues with inability to live independently.  AXIS V:  Current 40, past year 33.   PLAN:  Voluntarily admit her.  We are going to keep her on her same  medications at this point, contact her son and daughter, have a family  session, see what we need to do about in-home support.  She requires a  lot of cuing and assistance here in the hospital and probably cannot  function on her own living independently at home, so we will see what  assistance we can give them.      Margaret A. Scott, N.P.      Geoffery Lyons, M.D.  Electronically Signed    MAS/MEDQ  D:  06/16/2008  T:  06/16/2008  Job:  [id number]

## 2010-05-23 NOTE — Assessment & Plan Note (Signed)
[facility]                            CARDIOLOGY OFFICE NOTE   NAME:WRIGHTZamariah, Seaborn                  MRN:          [id number]  DATE:09/11/2007                            DOB:          06-08-1934    Mrs. Grandpre is a 76 year old patient referred for preop clearance.  Apparently, she had a abnormal-looking EKG.  In talking to the patient  coronary artery risk factors, primarily include hypertension and  hypercholesterolemia.  She has never had a heart problem.  There has  been no history of chest pain, palpitations, or arrhythmias.  She has  had previous surgeries without complication.  Her activities are limited  by her chronic foot problems, particularly the left ankle and foot.  She  is a nonsmoker and non-diabetic.   I reviewed records from Dr. Reita Cliche office   The patient's 2 EKGs from that office are uninterpretable due to  significant artifact in all of the limb leads.   In talking to the patient, she has never had a stress test or echo.   She frankly is asymptomatic in regards to her heart.   REVIEW OF SYSTEMS:  Remarkable for significant neuropathy in the lower  extremities, significant problems with a protruding bone in the left  foot with some ulceration, and certainly need for reconstructive work.   PAST MEDICAL HISTORY:  Remarkable for;  1. Hypertension.  2. Hypercholesterolemia.  3. History of hysterectomy.  4. History of previous eye surgery.  5. Multiple previous foot surgeries.   She has had hypothyroidism and has had a suppressed TSH's with  decreasing dosages of Synthroid recently.   She recently fell due to her instability from her foot problems and has  a couple of stitches in the left forehead region.   ALLERGIES:  The patient is allergic to SULFA and PENICILLIN.   CURRENT MEDICATIONS:  1. Lithium.  2. Vitamins.  3. Fiber.  4. Calcium.  5. Simvastatin 40 a day.  6. Pantoprazole  40 a day.  7. Enalapril 5  mg a day.  8. Synthroid 100 mcg a day.   She takes p.r.n. Aleve.   SOCIAL HISTORY:  The patient is divorced.  She has 5 children.  Her  daughter was with her today.  Ambulation is becoming increasingly  difficulty due to her neuropathy and chronic foot problem.  She does not  smoke or drink.   Review of systems, otherwise, remarkable for occasional constipation,  anxiety, and depression.   FAMILY HISTORY:  Unremarkable.   PHYSICAL EXAMINATION:  GENERAL:  Her exam is remarkable for an elderly  white female in no distress.  VITAL SIGNS:  Her blood pressure is 120/70; pulse 67 and regular;  respiratory rate 14, afebrile; and weight 157.  HEENT:  Unremarkable.  NECK:  Carotids are normal without bruit.  No lymphadenopathy,  thyromegaly, or JVP elevation.  LUNGS:  Clear, good diaphragmatic motion.  No wheezing.  HEART:  S1 and S2 with normal heart sounds.  PMI normal.  ABDOMEN:  Protuberant, status post hysterectomy with midline scar.  No  AAA.  No tenderness.  No bruit.  No hepatosplenomegaly  or hepatojugular  reflux.  EXTREMITIES:  Distal pulses are intact.  She has significant deformities  in both feet, particularly on the left.  She has neuropathy below the  knees.  SKIN:  Warm and dry.  MUSCULOSKELETAL:  No other muscular weakness.   EKG shows sinus rhythm with left axis deviation and poor R-wave  progression.  There is no acute changes.  No evidence of previous  infarct.   IMPRESSION:  1. Abnormal EKG, most of the issues regarding outside EKGs are      artifact.  The patient is asymptomatic.  I do not think her current      EKG is high risk.  She is cleared for surgery without further      testing.  No need for echo or stress test.  2. Hypertension, currently well controlled.  Continue current dose of      ACE inhibitor.  3. Hypercholesterolemia.  I do not have records on the patient      regarding her LDL.  However, she should continue her statin drug      and followup  with Dr. Meredeth Ide.  4. Hypothyroidism.  I think it would be important to document that the      patient's TSH has not suppressed before any surgery.  I have CT      readings most recently from I believe, February 2008, which showed      a suppressed TSH.  Her doses has have been decreased and she does      not appear hyperthyroid.  5. Question bipolar disease.  Lithium level of a normal range.  Mood      currently stable.   The patient clearly is having problems with her feet.  She has fallen  recently due to instability.  She has no symptoms referable to her heart  in a normal exam.  She is therefore cleared as the surgery is clearly in  her best interest to help, but increased her functionality.     Noralyn Pick. Eden Emms, MD, [facility]  Electronically Signed    PCN/MedQ  DD: 09/11/2007  DT: 09/12/2007  Job #: [id number]   cc:   Tinnie Gens A. Petrinitz, D.P.M.  Lilla Shook, MD  [facility]

## 2010-05-23 NOTE — Consult Note (Signed)
[name], [name]                ACCOUNT NO.:  [id number]   MEDICAL RECORD NO.:  [id number]          PATIENT TYPE:  INP   LOCATION:  1502                         [facility]ACILITY[facility]  [facility]   PHYSICIAN:  Antonietta Breach, M.D.  DATE OF BIRTH:  Feb 05, 1934   DATE OF CONSULTATION:  05/12/2008  DATE OF DISCHARGE:                                 CONSULTATION   [name] continues with severe depressed mood, poor energy, difficulty  concentrating.  She has difficulty with memory.  She has thoughts of  hopelessness and helplessness.  She prefers just to stay in bed.   REVIEW OF SYSTEMS:  NEUROLOGIC:  No stiffness or other extrapyramidal  side effects with the olanzapine component of her Symbyax.   WBC 5.9, hemoglobin 12.4, platelet count 184.   EXAMINATION:  VITAL SIGNS:  Temperature 98.6, pulse 78, respiratory rate  20, blood pressure 136/76, O2 saturation on room air is 98%.   MENTAL STATUS EXAM:  Formal memory testing 3/3 words immediate, 0/3 on  recall.  However, she is oriented.  She is cooperative and does have  insight into her depression.  Her affect is very constricted.  Mood is  profoundly depressed.  Her fund of knowledge and intelligence are  grossly within normal limits.  However, her attention span is decreased.  Her speech is very soft with a mildly flat prosody.  No dysarthria.   Thought process is coherent.   Thought content:  Severe hopelessness and helplessness..  No  hallucinations at this time of the exam.   Insight is partial.  Judgment is impaired.   ASSESSMENT:  AXIS I:  293.83 mood disorder not otherwise specified.  Bipolar disorder, not otherwise specified, depressed.   RECOMMENDATIONS:  Most likely, [name] cognitive and memory  deficiencies are due to her severe depression.  However, will order RPR,  B12, folic acid to completes a reversible CNS memory dysfunction  etiology workup.   Would continue the Symbyax for now.  The Symbyax does have fluoxetine  for anti depression.  Also, it has 12 mg daily of olanzapine which can  function as a mood stabilizer and an antidepressant augmentation agent.   However, would not recommend continuing Symbyax if other primary mood  stabilizers can work.   Acutely, Symbyax can be an appropriate drug of choice.   Over the long-term, however, a standard mood stabilizer, unless all  trials have exhausted, is a more optimal choice.   Also, regarding antidepression, she will likely need augmentation of her  fluoxetine.   These choices will be deferred to an inpatient psychiatric unit.   Would recommend that [name] be admitted to an inpatient  psychiatric unit due to the severity of her depression.  Her depression  could result in potentially lethal self-neglect.  Her functioning is too  low for her to be treated as an outpatient.      Antonietta Breach, M.D.  Electronically Signed     JW/MEDQ  D:  05/12/2008  T:  05/12/2008  Job:  [id number]

## 2010-05-23 NOTE — Discharge Summary (Signed)
[name], [name] NO.:  [id number]   MEDICAL RECORD NO.:  [id number]          PATIENT TYPE:  IPS   LOCATION:  0300                          FACILITY:  [facility]   PHYSICIAN:  Jasmine Pang, M.D. DATE OF BIRTH:  12-11-1934   DATE OF ADMISSION:  06/15/2008  DATE OF DISCHARGE:  06/18/2008                               DISCHARGE SUMMARY   IDENTIFICATION:  This is a 76 year old female who was discharged from  the Psychiatric Unit at [facility] about 3 days prior to  this admission.   HISTORY OF PRESENT ILLNESS:  The patient was taken home by her family  where she lives alone.  She was found on the morning of admission to be  disheveled and dirty, and a bit confused.  They were concerned that she  was over using her medications or not taking them appropriately.  She  was brought to the emergency room where she did appear to be somewhat  confused.  She was calm and cooperative today, very poor insight,  appears to be her baseline.  No signs of agitation.  No dangerous  thinking, no flight of ideas.  Focused and cooperative.  Having a  conversation with the aide who is helping her.  The patient is followed  by Dr. Archer Asa as an outpatient for bipolar disorder.  She has a  history of bipolar disorder type I with significant past history of  mania and psychosis.  She said more than one admission to [facility] in [place], [place].  For further admission information,  see psychiatric admission assessment.   PHYSICAL FINDINGS:  There were no acute physical or medical problems  noted.  Her physical exam was done in the emergency room prior to  transfer to our unit.   DIAGNOSTIC STUDIES:  Urine drug screen was positive for benzodiazepines.  TSH was 18.42.  Lithium level 0.77.  Basic metabolic panel revealed a  sodium of 142, potassium of 3.9, chloride of 105, carbon dioxide 27, BUN  14, and creatinine 1.16.   HOSPITAL COURSE:  Upon  admission, the patient was restarted on her home  medications of levothyroxine 100 mcg p.o. daily, Lamictal 200 mg p.o.  b.i.d.,  lithium carbonate 300 mg p.o. b.i.d., simvastatin 80 mg p.o.  q.h.s., and Protonix 40 mg p.o. daily.  She was also restarted on Prozac  40 mg daily and Zyprexa 15 mg at h.s., alprazolam 0.25 mg p.o. q.a.m.,  3:00 p.m., and h.s.; Tylenol 650 mg p.o. q.h.s. The patient was also  placed on one-to-one precautions since she was a high fall risk.  In  individual sessions, the patient was confused and disoriented at times.  She was somewhat anxious and depressed.  There was no suicidal ideation.  As hospitalization progressed, the patient was less depressed and less  anxious.  She continued to be confused and disoriented at times, so this  was improving at the hospitalization progressed.  This appeared to be  her baseline.  Her family was called and agreed to plan on something for  her in terms  of assisted living or staying with one of her children.  We  recommended that she could not stay by herself anymore as she was too  confused.  On June 18, 2008, the patient continued to be less depressed  and less anxious.  She had no suicidal or homicidal ideation.  There was  no evidence of psychosis or thought disorder.  Her sleep was good and  appetite was good.  And the   DISCHARGE DIAGNOSES:  Axis I:  Bipolar disorder type I.  History of  dementia.  Axis II:  None.  Axis III:  Hypothyroidism and dyslipidemia.  Axis IV:  Severe (issues with inability to live independently, burden of  psychiatric illness, burden of medical problems).  Axis V:  Global assessment of functioning was 45 upon discharge.  GAF  was 40 upon admission.  GAF highest past year was 55.   DISCHARGE PLANS:  There were no specific activity level or dietary  restrictions.   POSTHOSPITAL CARE PLANS:  The patient will go to Dr. Meredith Staggers at  [facility] on June 23, 2008 at 5 p.m.   DISCHARGE  MEDICATIONS:  1. Zyprexa 15 mg at bedtime.  2. Prozac 20 mg daily.  3. Zocor 80 mg daily.  4. Levothyroxine 100 mcg daily.  5. Protonix 40 mg daily.  6. Lamictal 200 mg at bedtime.  7. Xanax 0.25 mg q.a.m., 3:00 p.m., and at bedtime.  8. Lithium carbonate 300 mg twice a day with meds.   Her lithium level was 0.77 (0.8-1.2).  TSH was elevated at 18.40.  She  is to see her family doctor for review of TSH in about 4 weeks.      Jasmine Pang, M.D.  Electronically Signed     BHS/MEDQ  D:  06/26/2008  T:  06/27/2008  Job:  [id number]

## 2010-05-23 NOTE — Discharge Summary (Signed)
Erica, Farmer                ACCOUNT NO.:  [id number]   MEDICAL RECORD NO.:  [id number]          PATIENT TYPE:  IPS   LOCATION:  0300                          FACILITY:  [facility]   PHYSICIAN:  Jasmine Pang, M.D. DATE OF BIRTH:  1934/06/13   DATE OF ADMISSION:  06/28/2008  DATE OF DISCHARGE:  07/20/2008                               DISCHARGE SUMMARY   IDENTIFICATION:  This is a 76 year old divorced white female.   HISTORY OF PRESENT ILLNESS:  The patient stated that she saw her brother  and his wife hovering over and staring at her.  It is unclear exactly  how she will arrived to the emergency department. The EMS reports that  the patient lives alone and no one else was in the house.  At the time  of arrival, the patient had pinpoint pupils and a glassy appearance.  She reports that her daughter helps her with her medications and checks  in on her.  She has multiple bruises and scratches in the upper and  lower bilateral extremities.  The patient was just discharged from the  hospital stay here on June 15, 2008, to June 18, 2008.  She had been in  the geriatric psychiatric unit in [place] prior to that and had been  discharged about 3 days prior to the June 15, 2008, admission.  She was  also with [place] from April 14, 2008, to April 23, 2008.  Her family came in  for family session at the time of discharge on June 18, 2008.  They  agreed to plan on something for her in terms of assisted-living and  staying with one of her children.  The family members were told she  could not stay by herself anymore as she was too confused.  She was  discharged on June 18, 2008, as she was not suicidal or homicidal.  There was no evidence of frank psychosis or thought disorder.  Her sleep  and appetite were good and she was discharged to the family with an  upcoming psychiatric appointment with Dr. Meredith Staggers.  The patient has  been treated with lithium from many years at one point was on  Nardil.  She stayed at [facility] and was admitted with dysphoric  mania, dementia, failure to thrive, and also neglect of self.  For  further admission information, see psychiatric admission assessment.   PHYSICAL FINDINGS:  The patient was medically cleared in the ED at  [facility].  She was noted to have an UTI, otherwise she was physically  stable.   DIAGNOSTIC STUDIES:  Glucose was elevated at 159.  Lithium was 0.77.  Her creatinine was a little elevated at 1.27, and she had  benzodiazepines in her urine, which she is prescribed these.  TSH was  elevated at 18.4.   HOSPITAL COURSE:  Upon admission, the patient was taken off her lithium  and Prozac.  She was started on alprazolam 0.5 mg p.o. q.6 hours, p.r.n.  agitation.  She was also started on trazodone 50 mg p.o. p.r.n.  insomnia.  She was also restarted  on her home medications of Zyprexa 15  mg q.h.s., levothyroxine 100 mcg p.o. daily, Protonix 40 mg daily,  Lamictal 200 mg p.o. q.h.s., Zocor 80 mg p.o. daily, vitamin D3 400  units b.i.d.  The patient had to stay on one-to-one observation due to  her impaired perception and judgment and unable to process safely.  In  the individual sessions, the patient was initially disheveled with  minimal eye contact.  She has psychomotor retardation.  Speech was soft  and slow.  Mood was depressed and anxious.  Affect consistent with mood.  There was no suicidal or homicidal ideation.  No thoughts of self-  injurious behavior.  Her thought processes were disorganized and  confused.  She was delusional and had paranoid ideation.  There was no  evidence of hallucinations.  The patient continued to be depressed and  anxious.  She was often tearful.  She needed help with her ADLs and was  continued on one-to-one observations.  She continued to be confused and  could not remember, what my rule was with her. On July 04, 2008, the  patient endorsed suicidal ideation and  hopelessness, but her affect was  better.  She was walking without a walker, but needed a one-to-one to be  with her at all times.  Sleep was fair.  On July 05, 2008, I decided to  restart Prozac, she was appearing to get more depressed and anxious.  She was started on Prozac 10 mg p.o. daily.  She was also started on  hydrocodone 5/325 mg 1 tablet q.4 h p.r.n. knee pain.  On July 12, 2008,  the patient stated I feel depressed all of the time.  Her Prozac was  increased to 20 mg p.o. q.a.m.  She stated her family was not visiting,  but she talked with them on the phone.  She worries about her daughter,  who was just had a baby.  She worries about burdening her daughter.  On  July 13, 2008, she was still somewhat confused.  Mood was depressed and  anxious.  On July 14, 2008, she had talked with her daughter today and  felt good about this.  There was some poverty of thoughts, and she was  still somewhat confused.  She was still depressed and anxious.  On July 15, 2008, the patient was less depressed, but on July 16, 2008, she was  depressed and anxious with a tearful affect.  She still could not  remember me as her doctor.  As hospitalization progressed, the patient  became less depressed and anxious.  The Prozac was increased to 30 mg  p.o. daily on July 19, 2008.  She began to remember who I was.  On July 20, 2008, she was less depressed, less anxious.  Her daughter wanted to  pick her up and take her home.  She had been turned down from 2 skilled  nursing facilities.  Her sleep was good appetite was good.  Again, her  mood was less depressed, less anxious.  Affect was consistent with mood  was.  There was no suicidal or homicidal ideation.  No thoughts of self-  injurious behavior.  No auditory or visual hallucinations.  No paranoia  or delusions.  Thoughts continued to be somewhat confused with poverty  of thought, but were goal-directed.  She was having no side effects on  her  medications.  It was felt she would be safe for discharge today to  her daughter.  Daughter is going to continue to look for skilled nursing  home placement once normally is at home.   DISCHARGE DIAGNOSES:  Axis I:  Mood disorder, not otherwise specified,  generalized anxiety disorder, dementia.  Axis II:  None.  Axis III:  Hypothyroidism, dyslipidemia.  Axis IV: Severe (the patient is unable to live independently burden of  psychiatric illness, burden of medical problems).  Axis :  Global assessment of functioning was 50 upon discharge. GAF was  30 upon admission.  GAF highest past year was 55.   DISCHARGE PLANS:  There was no specific dietary restrictions.  Activity  was restricted by her, need to use a walker with orthopedic shoes.   POSTHOSPITAL CARE PLANS:  The patient will see Dr. Meredith Staggers on August 05, 2008, at 3:30 p.m.   DISCHARGE MEDICATIONS:  Prozac 30 mg daily, hydrocodone 5/325 mg 1  tablet every 4 hours as needed for pain, Zocor 80 mg with evening meal  vitamin D3 400 units twice a day, Zyprexa 20 mg at bedtime, alprazolam  0.5 mg up to 3 times a day as needed for anxiety, Protonix 40 mg daily,  Lamictal 200 mg at bedtime, levothyroxine 100 mcg daily.      Jasmine Pang, M.D.  Electronically Signed     BHS/MEDQ  D:  07/20/2008  T:  07/21/2008  Job:  [id number]

## 2010-05-26 NOTE — Consult Note (Signed)
[facility]  Patient:    [name], [name]                       MRN: [id number] Proc. Date: 08/15/99 Adm. Date:  [id number] Attending:  Samule Dry CC:         Dr. Antonietta Breach  Dr. Mariane Masters   Consultation Report  CHIEF COMPLAINT:  Cognitive impairment.  HISTORY OF PRESENT CONDITION:  Ms. [name] is a 76 year old divorced, right handed woman who was admitted for depression and for progressive cognitive impairment.  Patient has had very severe depression and has been tried on a variety of combinations of desipramine, Effexor, Remeron, in addition to other medications given for mania, including lithium and Zyprexa.  Patient also has history of thyroid cancer with thyroid ablation and need for Synthroid.  She has not been compliant with her medication according to her physician, Dr. Claudina Farmer.  Over the past few weeks, the patient has had increasing vegetative signs of depression, including anhedonia, hopelessness, diminished energy and concentration, decreased appetite and sleep, and suicidal ideation ideation without a plan.  She has become confused, particularly in the three days prior to admission, with poor short-term memory, word finding difficulties, and perseverative behaviors.  PAST PSYCHIATRIC HISTORY:  Patient had onset of mania 20 years ago, treated with lithium and Nardil for 15 years.  She has been admitted to [facility] on two occasions and to [facility] other occasions.  Her last admission was to [facility] for mania.  In addition to Effexor, desipramine, and Remeron, she has also been on Wellbutrin, Risperdal.  PAST MEDICAL HISTORY:  See above.  In addition, the patient has had osteoarthritis, detached retina, and bilateral cataracts with iridectomies.  REVIEW OF SYSTEMS:  Patient has not had intercurrent infections in head and neck, lungs, GI, GU, no rash,  anemia, bruises easily, diabetes, and no evidence of focal motor or sensory loss, no seizures.  Review of systems is otherwise negative.  MEDICATIONS ON ADMISSION:  Levoxyl .15 mg per day, Sidemil 0.125 mg per day, lithium 300 mg three times a day, and 150 mg at bedtime, Effexor 150 mg at bedtime, Remeron 30 mg at bedtime, Zyprexa 15 mg at bedtime.   Current medications:  Sidemil was discontinued, Levoxyl decreased to 1.12 mg per day, Effexor changed to 37.5 mg twice a day, then tapered, Celexa started 10 mg in the morning, increased to 20 mg four days ago.  Premarin 0.625 mg and Mobic 7.5 mg, Ativan 0.5 mg three times a day as needed have been added.  DRUG ALLERGIES:  PENICILLIN and SULFA.  EXAMINATION:  Today, vital signs blood pressure 152/77 lying, 165/95 standing, resting pulse 101 lying, 107 standing, respirations 20, temperature 99.2.  HEENT:  No signs of infection.  NECK:  Supple neck, full range of motion, no cranial or cervical bruits.  LUNG:  Clear to auscultation.  HEART:  No murmurs.  PULSE:  Normal.  ABDOMEN:  Soft, protuberant, bowel sounds normal, no hepatosplenomegaly.  EXTREMITIES:  Without edema, cyanosis, alterations in tone.  Heel cords are somewhat tight bilaterally.  MENTAL STATUS EXAMINATION:  Patient was awake and alert.  She had normal articulation.  She seems to have some difficulty with word finding but was able to name objects, repeat phrases and follow three-step commands.  No hallucinations or delusions were present.  She did recall 3/3 objects at 30 seconds, but 1/3 at  five minutes.  She was able to spell the word WORLD forward but on trying to spell it backward spelled it LDORW.  She knew her birthdate.  She knew that she was in [facility] psychiatric unit.  She did not know the year, month, day or date.  She thought that it was 2000, November, Wednesday, and knew that it was early in the month.  Patient was not able to recall my name.  She  was able to read and follow a command.  She was able to copy an object to a fair degree.  She was not able to write a coherent sentence that was spelled correctly.  She was able to calculate 25 nickels and a dollar and a quarter, although at first she said she could not do it.  She gave abstract responses to similarities in in 5/5 trials.  Patient seemed to be anxious and occasionally sighed and placed her head in her hands when she knew she did not do well.  CRANIAL NERVE EXAMINATION:  Round, reactive pupils, normal fundi, full visual fields to double simultaneous stimuli.  Extraocular movements full and conjugate, OK and responsive.   Equal bilaterally, symmetric facial strength and sensation to air conduction greater than bone conduction bilaterally.  MOTOR EXAMINATION:  Normal strength, tone and mass, good fine motor movements, no ponies or drift.  SENSATION:  Showed mild peripheral neuropathy to the upper calf.  Patient had decreased proprioception in the left toe, intact proprioception to the left foot.  Decreased vibration to both big toes and good vibration to the ankles. She had intact proprioception and vibration in her hands.  She had only fair stereoagnosis and did not do a good job of exploring an object with her left hand, did a better job with her right hand.  CEREBELLAR EXAMINATION:  Good finger to nose.  Rapid repetitive movements were somewhat slow.  Patient had a tremor with her hands extended from her body. Her gait is slightly broad based, toes pointing on out, slight shuffle and stiffness to her legs.  She is able to get up on her heels and toes.  She cannot perform a tandem without losing her balance.  Romberg response is negative.  Deep tendon reflexes were diminished at the biceps and knees, absent elsewhere.  She had bilateral flexor planar responses.  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  Etiology of this is unclear.  I  do not know if there could  be any drug interaction in these medications.  Many of them she was on before she came into the hospital.  The very sudden onset of her cognitive change is of concern; however, I do not see evidence of a stroke.  Patient could have a problem like normopressure hydrocephalus, subdural hematoma, or even a central nervous system neoplasm, but I think it is unlikely.  RECOMMENDATIONS:  Patient needs to have an MRI scan of the brain without and with contrast.  If there is evidence of stroke-like abnormality, the patient also needs an magnetic resonance angiogram of the brain.  I suspect that we will find some diffuse small vessel disease, but nothing else.  Patient needs an EEG to look for the presence of seizures that could cause atypical behavior.  Finally, patient needs to have a completion of the work-up for treatable causes of dementia.  If all this is negative or unrevealing, she may need to have a lumbar puncture to look for the presence of Alzheimers disease with the AB42 pantoyl  proteins.  The very rapid onset of her condition also would raise the question of Creutzfeldt-Jakob.  The EEG should be helpful in this regard.  If you have any questions about this or I can be of assistance do not hesitate to contact me. DD:  08/15/99 TD:  08/17/99 Job: [id number]

## 2010-05-26 NOTE — Procedures (Signed)
[facility]  Patient:    [name], [name] Visit Number: [id number] MRN: [id number]          Service Type: END Location: ENDO Attending Physician:  Sabino Gasser Dictated by:   Sabino Gasser, M.D. Proc. Date: 07/02/01 Admit Date:  07/02/2001   CC:         Malachi Pro. Ambrose Mantle, M.D.   Procedure Report  PROCEDURE:  Upper endoscopy.  INDICATION FOR PROCEDURE:  Hemoccult positivity.  ANESTHESIA:  Demerol 50, Versed 5 mg.  DESCRIPTION OF PROCEDURE:  With the patient mildly sedated in the left lateral decubitus position, the Olympus videoscopic endoscope was inserted in the mouth and passed under direct vision through the esophagus. The distal esophagus was approached and appeared mildly mottled, although there may be some mild esophagitis there. This was photographed and biopsied. We entered into the stomach, the fundus and body appeared normal. The antrum showed diffuse erythematous changes in a measles-like distribution, possibly a H. pylori gastritis which was photographed and biopsied. The duodenal bulb and second portion of the duodenum appeared normal. From this point, the endoscope was slowly withdrawn taking circumferential views of the entire duodenal mucosa until the endoscope was then pulled back into the stomach, placed in retroflexion to view the stomach from below. The endoscope was then straightened and withdrawn taking circumferential views of the remaining gastric and esophageal mucosa. The patients vital signs and pulse oximeter remained stable. The patient tolerated the procedure well and there were no apparent complications.  FINDINGS:  Some irregularity of the Z line and the distal esophagus. Rule out Barretts esophagus. Rule out esophagitis and changes of gastritis in the antrum and again biopsy. Await biopsy results. The patient will call me for results and follow up with me in as an outpatient. Proceed to colonoscopy  as planned. Dictated by:   Sabino Gasser, M.D. Attending Physician:  Sabino Gasser DD:  07/02/01 TD:  07/03/01 Job: [id number]

## 2010-05-26 NOTE — Procedures (Signed)
[facility]  Patient:    [name], [name] Visit Number: [id number] MRN: [id number]          Service Type: END Location: ENDO Attending Physician:  Sabino Gasser Dictated by:   Sabino Gasser, M.D. Proc. Date: 07/02/01 Admit Date:  07/02/2001   CC:         Malachi Pro. Ambrose Mantle, M.D.   Procedure Report  PROCEDURE:  Colonoscopy.  INDICATION FOR PROCEDURE:  Hemoccult positivity.  PREP:  Visicol tablets and results was good.  ANESTHESIA:  Not given.  DESCRIPTION OF PROCEDURE:  With the patient mildly sedated in the left lateral decubitus position, a rectal examination was performed which was unremarkable. She had some external skin tags. Subsequently, the Olympus videoscopic variable stiffness colonoscope was inserted in the rectum and passed under direct vision to the cecum identified by the ileocecal valve and appendiceal orifice both of which were photographed. From this point, the colonoscope was slowly withdrawn taking circumferential views of the entire colonic mucosa, stopping at approximately 60 cm from the anal verge at which point in the sigmoid colon, a large polyp was seen and was pedunculated. Subsequently using snare cautery technique, the polyp was then snared at the stalk. Using a setting of 20:20 blended current, it was resected and tissue was grabbed with a Lucina Mellow retrieval basket and pulled out through the rectum and sent for pathology. The endoscope was then reinserted to this level and slowly withdrawn taking circumferential views of the remaining colonic mucosa and then stopping only about 25 cm from the anal verge at which point a second polyp was seen that was small and sessile. It was removed using hot biopsy forceps technique on a setting of 20:20 blended current at approximately 25 cm from the anal verge. The remainder of the examination appeared unremarkable. The rectum in direct and retroflexed view was unremarkable. The  patients vital signs and pulse oximeter remained stable. The patient tolerated the procedure well without apparent complications.  FINDINGS:  Polyps at 60 and 25 cm from the anal verge otherwise an unremarkable colonoscopic examination.  PLAN:  Await biopsy report but the first polyp was quite large and will have the patient followup with me as an outpatient. Dictated by:   Sabino Gasser, M.D. Attending Physician:  Sabino Gasser DD:  07/02/01 TD:  07/03/01 Job: [id number]

## 2010-05-26 NOTE — Discharge Summary (Signed)
[facility]  Patient:    [name], [name]                         MRN: [id number] Adm. Date:  08/09/99 Disc. Date: 08/24/99 Attending:  Justine Null, M.D. [facility] CC:         [name], M.D., Endocrinology   Discharge Summary  REASON FOR ADMISSION:  Depression.  HISTORY OF PRESENT ILLNESS:  The patient is a 76 year old woman admitted by Dr. Antonietta Farmer on August 09, 1999, with cognitive impairment and depression.  Please refer to his dictated history and physical for details.  HOSPITAL COURSE:  The patient was admitted and internal medicine was consulted because of delirium.  Neurology was later consulted, and lumbar puncture did not reveal an etiology.  Other neurologic work-up also was nondiagnostic.  The patients hypertension medications were adjusted with a good result.  Thyroid function studies were reviewed, and the patient was found to be mildly over supplemented with Synthroid.  I telephone [facility] and found that the patients Synthroid had been reduced due to a suppressed TSH approximately one month prior to admission.  As the TSH continued to be low, the Synthroid was discontinued and follow-up at [facility] was arranged.  The patients cognitive situation improved and although she was alert and cooperative on the day of discharge, it was the unanimous opinion of providers that around-the-clock care would be needed.  I discussed this with [name], who is the patients daughter.  I strongly advised 24-hour supervision for her.  As a primary care physician was also needed, [facility] was advised.  The patient was discharged in fair condition on August 24, 1999.  DISCHARGE DIAGNOSES: 1. Delirium, improved.  Only possible etiologic factor found is a suppressed    TSH, which I believe to be an unlikely etiology of this. 2. Hypertension.  FOLLOW-UP:  Follow-up will be with Dr. Criss Alvine within a  month.  The patient will have thyroid function studies followed up there.  DISCHARGE MEDICATIONS: 1. Celexa 20 mg daily. 2. Premarin 0.625 mg daily. 3. Mobic 7.5 mg daily. 4. Zyprexa 15 mg at h.s. 5. Remeron 30 mg at h.s. 6. Lithium carbonate 300 mg 3 times daily. 7. Vasotec 2.5 mg twice daily. 8. Lithium citrate 4 mEq q.h.s.  OTHER INSTRUCTIONS: 1. Twenty-four hour supervision needed. 2. For now, do not take Synthroid.  DIET/ACTIVITY:  No restriction on diet or activity, except that 24-hour supervision is needed. DD:  09/20/99 TD:  09/22/99 Job: [id number]

## 2010-05-26 NOTE — H&P (Signed)
[facility]  Patient:    [name]                        MRN: [id number] Adm. Date:  [id number] Attending:  Samule Dry                         History and Physical  PATIENT IDENTIFICATION:  Ms. [name] is a 76 year old female admitted for further evaluation and treatment of cognitive impairment and depression.  HISTORY OF PRESENT ILLNESS:  The patient was treated with Effexor 300 mg q.d. for depression this past year and this was not effective.  Desipramine was added and the patient was tried on 225 mg of Effexor, combined with 250 mg of desipramine per day.  The patient developed some hypertension and the Effexor was reduced to 75 mg a day.  The depression did not respond to the increased desipramine.  The patient was weaned off desipramine this past week prior to admission.  Effexor was continued at 150 mg q.h.s., with Remeron restarted at 30 mg q.h.s.  Over several weeks, the patient has continued to worsen with her depression symptoms including anhedonia, hopelessness, poor energy, poor concentration, poor appetite, decreased sleep and suicidal ideation.  The patient has developed progressive confusion that has become severe over the past three days.  She has had poor short-term memory, difficulty with word finding.  On August 08, 1999, the patient was removing a bottle top successfully, then she kept trying to remove the bottle top afterwards, as if it were still on the bottle.  PAST PSYCHIATRIC HISTORY:  The patient had her first manic episode about 20 years prior to admission and was lithium and Nardil for 15 years.  She has a history of two [facility], an admission to [facility] as well as an admission to [facility] for psychiatric inpatient care. Her last admission was to [facility] one year prior to admission for mania. She has no history of suicide attempts.  She has a history of treatment  with Wellbutrin and Risperdal.  She has no history of Celexa.  There are no other psychotropics known at this time.  SOCIAL HISTORY:  She was born in [place], [place].  Siblings:  One brother and two half-sisters.  Education:  High school.  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  Children:  Five.  Marital status:  Divorced.  She currently lives with her daughter, [name], and [name] husband.  Alcohol history:  She drinks beer, about one every two weeks.  She has no history of secondary financial, legal, social or occupational difficulty due to alcohol. She has no history of illegal drugs.  She does not smoke tobacco.  FAMILY PSYCHIATRIC HISTORY:  None.  GENERAL MEDICAL PROBLEMS:  History of thyroid cancer and thyroid ablation, currently maintained on thyroid supplementation, arthritis, cataracts, history of detached retina.  MEDICATIONS ON ADMISSION 1. Levoxyl 0.15 mg q.d. 2. Cytomel 0.0125 mg per day. 3. Lithium 300 mg t.i.d. and 150 mg q.h.s. 4. Effexor 150 mg q.h.s. 5. Remeron 30 mg q.h.s. 6. Zyprexa 15 mg q.h.s.  DRUG ALLERGIES:  PENICILLIN and SULFA.  REVIEW OF SYSTEMS:  HEAD:  No eye, ear, nose or throat difficulty. RESPIRATORY:  No coughing or wheezing.  CARDIOVASCULAR:  No chest pain, palpitations or edema.  GASTROINTESTINAL:  No nausea, vomiting or diarrhea. GENITOURINARY:  No dysuria.  ENDOCRINE/METABOLIC:  The patient  does have heat intolerance.  MUSCULOSKELETAL:  No deformities, weaknesses, swellings. NEUROLOGIC:  No focal motor or sensory loss.  No history of seizures.  PHYSICAL EXAMINATION:  VITAL SIGNS:  Temperature 98.1, pulse 72, respirations 18, blood pressure 118/77, sitting, 113/71, standing.  Please see the physical examination, laboratory data and EKG from the emergency room.  The patients QTc on EKG is 485.  Her laboratory reports are remarkable for a low TSH and a T4 of 14.  MENTAL STATUS EXAMINATION:  The patient is alert.  Her speech  involves normal articulation.  Thought process involves pausing frequently for word finding with overall coherence.  Thought content:  Suicidal ideation is intermittent without plan or intent.  No thoughts of harming others.  No hallucinations. No delusions.  Abstractions:  Proverbs 0-out-of-2.  Objects 3-out-of-3.  The patient is able to spell the word "world" forward on concentration testing but not backwards.  Long-term memory:  She is able to give date of birth. Short-term memory:  Immediate 3-out-of-3 and with prompting, she still got 0-out-of-3 at five minutes.  Orientation:  Could not name the year, the mouth, the day of the month or the day of the week.  She did name correctly the approximate time of day and place.  Affect is anxious.  Judgment is good for the need of treatment.  The patient does have insight into her difficulty with mood and her difficulty with thinking and memory.  ADMITTING DIAGNOSES: Axes I:    1. Bipolar I disorder, depressed.            2. Cognitive disorder, not otherwise specified (functional versus               secondary to hypothyroid state, possibly both contributing. Axis II:   Deferred. Axis III:  See the general medical history above. Axis IV:   Medical problems. Axis V:    Thirty-five.  INITIAL PLAN 1. Will recheck an a.m. trough lithium level since the timing of the blood    draw in the ER does not appear to be a 12-hour trough. 2. The undersigned consulted with Dr. Fritzi Mandes over the phone, the    patients endocrinologist.  He reported a history of noncompliance with the    correct regimen.  He recommended checking a free T4 now, discontinue the    Cytomel and reducing the Synthroid to 0.112 mg q.d.; he also recommended    checking a free T4 and TSH in one month after this change. 3. The patients Effexor will be tapered off.  The patient was able to    understand the indications, alternatives and side-effects of Celexa and it     will be started at 10 mg q.a.m. for antidepression as the initial dose,    titrating to 20 mg q.a.m. as tolerated. 4. Remeron and Zyprexa will be continued as adjuncts for antidepression and     mood stabilization, respectively, potentially tried off as the patients    mood stabilizes. 5. There will be no change in the lithium dosage.  It will be continued as the    primary mood stabilizer. 6. The patient will undergo milieu and low-functioning supportive level    psychotherapy.  TENTATIVE LENGTH OF STAY:  Five days.  FOLLOWUP:  Follow up with psychiatrist, Dr. Jacqulynn Cadet. Cottle, and endocrinologist, Dr. Criss Alvine.  DISCHARGE CRITERIA:  ______ DD:  08/09/99 TD:  08/11/99 Job: [id number]

## 2010-05-26 NOTE — Discharge Summary (Signed)
[facility]  Patient:    [name], [name]                       MRN: [id number] Adm. Date:  [id number] Disc. Date: 08/17/99 Attending:  Judie Petit                           Discharge Summary  HISTORY OF PRESENT ILLNESS:  Please see the admission dictation.  GENERAL MEDICAL FINDINGS:  The patient demonstrated a mildly elevated white blood cell count on repeat 15 and then 12.  Her endocrinologist was consulted about her hyperthyroid status.  The Sidemil was discontinued.  The Levoxyl was reduced to .112 mg q.d.  Neurology was consulted.  They did not believe that the patients organic presentation was consistent with hyperthyroidism.  The patient was continuing with perservation and marked orientation difficulty.  A extensive central nervous system work-up was ordered and is currently pending. The patient has undergone a brain MRI.  An EEG is pending.  B12, RBC, folate, VDRL, ESR and ANA have been ordered.  The patients vital signs are stable and she presents no other signs of inflammation or signs of infection.  She is able to eat and ambulate.  She is fully continent.  HOSPITAL COURSE:  The patient was admitted to [facility] unit and underwent supportive-level low stimulation psychotherapy.  Her Effexor XR was titrated off, and she was started on Celexa 10 mg qam, titrated to 20 mg q.a.m. for antidepression.  She did not demonstrate any adverse medication effects.  She was continued on the Remeron for augmenting the Celexa and she was continued on Zyprexa for mood stabilization and anti-mania. She was continued on lithium 1050 mg per day as her primary mood stabilizer. She required Ativan 1 mg t.i.d. for feeling on edge.  CONDITION ON DISCHARGE:  Please see the general medical discussion above.  The patients mood has improved to the point that suicidal ideation is consistently resolved.  The patient does continue with depressed  mood and low energy, as well as feeling on edge and anhedonia.  She has difficulty concentrating.  She continues with the same cognitive deficits as described above, including perservation.  She does not know the year, the month, the day of the month, the day of the week.  She is alert.  Please see the mental status below.  VITAL SIGNS:  Upon discharge, temperature 97.2, pulse 82, respirations 16, blood pressure 150/101 sitting, 144/90 standing.  MENTAL STATUS EXAMINATION:  Upon discharge, the patient is alert.  Orientation deficits described above.  Thought process involves intermittent perseveration.  She continues to repeat questions and when one subject is changed she will perseverate on the previous subject.  Thought content:  No thoughts of harming herself, no thoughts of harming others, no delusions, no hallucinations.  Memory is partial for the events of the day.  Judgment is impaired.  Insight is partial in that the patient does recognize that she has a problem with orientation.  FINAL DIAGNOSIS: Axis I:     1. Bipolar I disorder, depressed.  No longer meets inpatient                psychiatric criteria.             2. Cognitive disorder not otherwise specified.  The patient is at  risk for lethal self neglect due to her cognitive impairment. Axis II:    None. Axis III:   Organic brain syndrome, hyperthyroidism, degenerative joint             disease, cataracts. Axis IV:    General medical. Axis V:     40.  DISCHARGE PLAN:  The patient will be transferred to the medical psychiatric unit at [facility] for further evaluation and treatment of her organic brain syndrome.  She will continue on the Celexa trial, 20 mg p.o. q.a.m. for antidepression.  She will be restricted to the ward due to her cognitive deficits.  She will have a free T4 and TSH repeated in three weeks (after the Synthroid reduction).  Diet regular.  While on the med-psych unit, the  patient will continue with neurological consultation and psychiatric consultation.  DISCHARGE MEDICATIONS: 1. Ativan 1 mg p.o. t.i.d. p.r.n. 2. Celexa 20 mg p.o. q.a.m. 3. Premarin 0.625 mg p.o. q.d. 4. Mobic 7.5 mg p.o. q.d. 5. Levoxyl 0.112 mg p.o. q.d. 6. Lithium 300 mg p.o. t.i.d. and 150 mg p.o. q.h.s. 7. Zyprexa 15 mg p.o. q.h.s. 8. Remeron 30 mg p.o. q.h.s. DD:  08/17/99 TD:  08/19/99 Job: [id number]

## 2010-05-26 NOTE — H&P (Signed)
[name], [name]                ACCOUNT NO.:  [id number]   MEDICAL RECORD NO.:  [id number]          PATIENT TYPE:  EMS   LOCATION:  MAJO                         FACILITY:  [facility]   PHYSICIAN:  Melissa L. Ladona Ridgel, MD  DATE OF BIRTH:  24-Nov-1934   DATE OF ADMISSION:  09/27/2003  DATE OF DISCHARGE:                                HISTORY & PHYSICAL   CHIEF COMPLAINT:  Slurred speech, unsteady gait, confusion.   PRIMARY CARE PHYSICIAN:  Unassigned.   HISTORY OF PRESENT ILLNESS:  The patient is a 76 year old white female with  a past history for bipolar disorder.  She currently takes lithium for the  last 13 years and presented to the emergency room with several days of  slurred speech,  unsteady gait, and confusion.  She relates no new change in  medication, no intercurrent illnesses, and her daughter states that she was  in her usual state of health until a couple of days ago when they noted she  was having these symptoms.   REVIEW OF SYSTEMS:  No fever, no chills, no nausea and vomiting, no dysuria,  no melena, no hematochezia, no shortness of breath, cough or syncope.  All  other review of systems was negative.   PAST MEDICAL HISTORY:  1.  Bipolar disease  2.  Thyroid cancer status post ablation, on Synthroid.  3.  She likely has a neuropathy with possible Charcot foot.  4.  Colonoscopy in 2003 revealed multiple polyps which were biopsied.      Biopsies at the time showed tubal villas, adenoma with no high grade      dysplasia or malignancy.  She also had abnormal V-line on EGD which      showed reactive changes, no metaplasia  or varus esophagus was      identified.   PAST SURGICAL HISTORY:  1.  She had basal cell carcinoma removed from her eyelid.  2.  She had a hysterectomy.  3.  Foot surgeries for fallen arches.  4.  Thyroidectomy per her daughter, however her old records indicate she had      ablation.   SOCIAL HISTORY:  She does not smoke.  She does have an occasional  beer, no  other illicit drugs are used.   FAMILY HISTORY:  Mom is deceased secondary to aneurysm.  Dad is deceased of  unknown causes.  There may have been an alcohol problem in both parents.   ALLERGIES:  PENICILLIN, SULFA.   CURRENT MEDICATIONS:  1.  Levothyroxine 0.175 mcg q.a.m. is confirmed by her daughter.  2.  Lamictal 200 mg daily  3.  Lithium 300 mg p.o. t.i.d.  4.  Aricept 10 mg p.o. daily  5.  Zyprexa 5 mg p.o. daily  6.  Multivitamin, Vitamin B12  7.  Enalapril 5 mg p.o. daily.   VITAL SIGNS:  Temperature is 97.9, blood pressure is 106/69, pulse is 86,  respirations are 22.  Her sats are 96%.   PHYSICAL EXAMINATION:  GENERAL:  This is a pleasant white female who is  confused but re-orientable.  She is in no  acute distress.  HEAD:  Normocephalic and atraumatic.  Pupils are equal, round and reactive  to light.  Extraocular muscles are intact.  Mucous membranes are moist.  NECK:  Supple.  There is no JVD, no lymphadenopathy.  She does have some  psoriasis on her forehead and nasal labial fold area.  CHEST:  Clear to auscultation, no rhonchi, rales or wheezes.  CARDIOVASCULAR:  Regular rate and rhythm, positive S1, S2, no S3, S4, no  murmurs, rubs or gallops.  ABDOMEN:  Soft, nontender, nondistended with positive bowel sounds.  There  is no hepatosplenomegaly.  EXTREMITIES:  Show positive left foot deformity.  She has a brace on that  left foot.  NEUROLOGIC:  She is a present Romberg with a positive pronator drift,  positive tremor, no nystagmus.  Power is 5/5.  Deep tendon reflexes are 1+  and she is not hyper reflexive at this time.   ADMISSION LABORATORIES:  Reveal a urinalysis with trace leukocyte esterase.  Sodium is 136, potassium is 4.4, chloride is 111, CO2 is 23. BUN is 14,  creatinine is 1.3.  Her glucose is 87.  Her PT is 12.8, INR is 1, PTT is 30,  her lithium level is 2.09.  EKG is pending.   ASSESSMENT AND PLAN:  This is a 76 year old white female with  a history of  bipolar disease.  Psych admissions in 2001, 2003, presents with a new onset  tremor, gait instability and slurred speech with increased confusion.  Her  lithium level is found to be 2, likely causing these symptoms.   1.  Lithium toxicity.  We will gently hydrate this patient with half-normal      saline and observe her sodium.  We will repeat a lithium level in the      morning.  2.  Cardiovascular.  Continue her enalapril, withhold orders will be      designated for systolic blood pressure less than 100.  We will check an      EKG and place the patient on telemetry monitor.  3.  Pulmonary.  She appeared stable but I will check a chest x-ray to rule      out occult infection.  4.  Endocrinology.  Will check a TSH and a hemoglobin A1C as well as a B12      level.  5.  GI.  She will have a cardiac prudent diet.  6.  GU.  We will start treating her for a UTI with Cipro and await final      cultures.        MLT/MEDQ  D:  09/27/2003  T:  09/27/2003  Job:  [id number]

## 2010-05-26 NOTE — Procedures (Signed)
[facility]  Patient:    [name], [name]                       MRN: [id number] Proc. Date: 08/20/99 Adm. Date:  [id number] Attending:  Judie Petit                           Procedure Report  DATE OF BIRTH:  1934-08-19  INDICATIONS FOR PROCEDURE:  Progressive dementia 294.8.  HISTORY OF PRESENT ILLNESS:  Ms. [name] is a 76 year old woman with progressive onset of dementia and severe longstanding depression.  Attempts were made both lying and sitting on August 19, 1999 by me. These were unsuccessful. Under fluoroscopy, Dr. Milagros Loll was able to visualize scoliosis and also revealed evidence of a calcified ligamentum flavum that complicated the procedure.  He was able to atraumatically penetrate the L2-3 interspace. Clear colorless fluid was present.  Opening pressure of 14 cm of water, closing pressure of 10 cm of water, 14 cc of clear colorless fluid was obtained and sent to the laboratory for the following; tube 1 culture and gram stain, tube 2 glucose protein cryptococcal antigen and VDRL, tube 3 cell count and differential, tube 4 ______ AB42 protein and also protein for Creutzfeldt-Jakob.  The patient tolerated the procedure well. DD:  08/20/99 TD:  08/21/99 Job: [id number]

## 2010-08-23 ENCOUNTER — Other Ambulatory Visit: Payer: Self-pay | Admitting: Internal Medicine

## 2010-08-23 DIAGNOSIS — Z1231 Encounter for screening mammogram for malignant neoplasm of breast: Secondary | ICD-10-CM

## 2010-08-23 DIAGNOSIS — Z13820 Encounter for screening for osteoporosis: Secondary | ICD-10-CM

## 2010-09-11 ENCOUNTER — Inpatient Hospital Stay (HOSPITAL_COMMUNITY)
Admission: EM | Admit: 2010-09-11 | Discharge: 2010-09-20 | DRG: 689 | Disposition: A | Discharge status: Skilled Nursing Facility | Payer: Medicare Other | Attending: Internal Medicine | Admitting: Internal Medicine

## 2010-09-11 ENCOUNTER — Emergency Department (HOSPITAL_COMMUNITY): Payer: Medicare Other

## 2010-09-11 DIAGNOSIS — R4182 Altered mental status, unspecified: Secondary | ICD-10-CM | POA: Diagnosis present

## 2010-09-11 DIAGNOSIS — M1990 Unspecified osteoarthritis, unspecified site: Secondary | ICD-10-CM | POA: Diagnosis present

## 2010-09-11 DIAGNOSIS — F3130 Bipolar disorder, current episode depressed, mild or moderate severity, unspecified: Secondary | ICD-10-CM | POA: Diagnosis present

## 2010-09-11 DIAGNOSIS — N390 Urinary tract infection, site not specified: Principal | ICD-10-CM | POA: Diagnosis present

## 2010-09-11 DIAGNOSIS — N179 Acute kidney failure, unspecified: Secondary | ICD-10-CM | POA: Diagnosis present

## 2010-09-11 DIAGNOSIS — Z79899 Other long term (current) drug therapy: Secondary | ICD-10-CM

## 2010-09-11 DIAGNOSIS — G9341 Metabolic encephalopathy: Secondary | ICD-10-CM | POA: Diagnosis present

## 2010-09-11 DIAGNOSIS — IMO0002 Reserved for concepts with insufficient information to code with codable children: Secondary | ICD-10-CM | POA: Diagnosis present

## 2010-09-11 DIAGNOSIS — N183 Chronic kidney disease, stage 3 unspecified: Secondary | ICD-10-CM | POA: Diagnosis present

## 2010-09-11 DIAGNOSIS — E870 Hyperosmolality and hypernatremia: Secondary | ICD-10-CM | POA: Diagnosis present

## 2010-09-11 DIAGNOSIS — Z880 Allergy status to penicillin: Secondary | ICD-10-CM

## 2010-09-11 DIAGNOSIS — E785 Hyperlipidemia, unspecified: Secondary | ICD-10-CM | POA: Diagnosis present

## 2010-09-11 DIAGNOSIS — F411 Generalized anxiety disorder: Secondary | ICD-10-CM | POA: Diagnosis present

## 2010-09-11 DIAGNOSIS — E559 Vitamin D deficiency, unspecified: Secondary | ICD-10-CM | POA: Diagnosis present

## 2010-09-11 DIAGNOSIS — E039 Hypothyroidism, unspecified: Secondary | ICD-10-CM | POA: Diagnosis present

## 2010-09-11 DIAGNOSIS — T438X5A Adverse effect of other psychotropic drugs, initial encounter: Secondary | ICD-10-CM | POA: Diagnosis present

## 2010-09-11 DIAGNOSIS — Z8701 Personal history of pneumonia (recurrent): Secondary | ICD-10-CM

## 2010-09-11 DIAGNOSIS — G609 Hereditary and idiopathic neuropathy, unspecified: Secondary | ICD-10-CM | POA: Diagnosis present

## 2010-09-11 DIAGNOSIS — I129 Hypertensive chronic kidney disease with stage 1 through stage 4 chronic kidney disease, or unspecified chronic kidney disease: Secondary | ICD-10-CM | POA: Diagnosis present

## 2010-09-11 DIAGNOSIS — F068 Other specified mental disorders due to known physiological condition: Secondary | ICD-10-CM | POA: Diagnosis present

## 2010-09-11 LAB — CBC
HCT: 42.2 % (ref 36.0–46.0)
Hemoglobin: 12.9 g/dL (ref 12.0–15.0)
MCH: 25.9 pg — ABNORMAL LOW (ref 26.0–34.0)
MCHC: 30.6 g/dL (ref 30.0–36.0)
MCV: 84.7 fL (ref 78.0–100.0)
RBC: 4.98 MIL/uL (ref 3.87–5.11)

## 2010-09-11 LAB — URINE MICROSCOPIC-ADD ON

## 2010-09-11 LAB — URINALYSIS, ROUTINE W REFLEX MICROSCOPIC
Bilirubin Urine: NEGATIVE
Glucose, UA: NEGATIVE mg/dL
Specific Gravity, Urine: 1.005 (ref 1.005–1.030)
Urobilinogen, UA: 0.2 mg/dL (ref 0.0–1.0)

## 2010-09-11 LAB — COMPREHENSIVE METABOLIC PANEL
BUN: 27 mg/dL — ABNORMAL HIGH (ref 6–23)
CO2: 20 mEq/L (ref 19–32)
Calcium: 10.3 mg/dL (ref 8.4–10.5)
Creatinine, Ser: 1.37 mg/dL — ABNORMAL HIGH (ref 0.50–1.10)
GFR calc Af Amer: 46 mL/min — ABNORMAL LOW (ref >60)
GFR calc non Af Amer: 38 mL/min — ABNORMAL LOW (ref >60)
Glucose, Bld: 107 mg/dL — ABNORMAL HIGH (ref 70–99)
Total Protein: 7.9 g/dL (ref 6.0–8.3)

## 2010-09-11 LAB — LIPASE, BLOOD: Lipase: 40 U/L (ref 11–59)

## 2010-09-11 LAB — CK TOTAL AND CKMB (NOT AT ARMC)
CK, MB: 2.2 ng/mL (ref 0.3–4.0)
Relative Index: INVALID (ref 0.0–2.5)

## 2010-09-11 LAB — DIFFERENTIAL
Basophils Absolute: 0 10*3/uL (ref 0.0–0.1)
Basophils Relative: 0 % (ref 0–1)
Eosinophils Absolute: 0 10*3/uL (ref 0.0–0.7)
Eosinophils Relative: 0 % (ref 0–5)
Neutrophils Relative %: 79 % — ABNORMAL HIGH (ref 43–77)

## 2010-09-12 LAB — CBC
HCT: 37.7 % (ref 36.0–46.0)
MCH: 25.5 pg — ABNORMAL LOW (ref 26.0–34.0)
MCV: 85.1 fL (ref 78.0–100.0)
Platelets: 289 10*3/uL (ref 150–400)
RBC: 4.43 MIL/uL (ref 3.87–5.11)
RDW: 18 % — ABNORMAL HIGH (ref 11.5–15.5)
WBC: 7.6 10*3/uL (ref 4.0–10.5)

## 2010-09-12 LAB — COMPREHENSIVE METABOLIC PANEL
Albumin: 3.2 g/dL — ABNORMAL LOW (ref 3.5–5.2)
Alkaline Phosphatase: 106 U/L (ref 39–117)
BUN: 23 mg/dL (ref 6–23)
Chloride: 116 mEq/L — ABNORMAL HIGH (ref 96–112)
Creatinine, Ser: 1.27 mg/dL — ABNORMAL HIGH (ref 0.50–1.10)
GFR calc Af Amer: 50 mL/min — ABNORMAL LOW (ref >60)
Glucose, Bld: 113 mg/dL — ABNORMAL HIGH (ref 70–99)
Potassium: 3.7 mEq/L (ref 3.5–5.1)
Total Bilirubin: 0.4 mg/dL (ref 0.3–1.2)

## 2010-09-12 LAB — URINE CULTURE

## 2010-09-12 NOTE — H&P (Signed)
[name], [name] NO.:  [id number]  MEDICAL RECORD NO.:  [id number]  LOCATION:  WLED                         FACILITY:  [facility]  PHYSICIAN:  Marinda Elk, M.D.DATE OF BIRTH:  11-Jan-1934  DATE OF ADMISSION:  09/11/2010 DATE OF DISCHARGE:                             HISTORY & PHYSICAL   PRIMARY CARE DOCTOR:  Dr. Selena Batten.  CHIEF COMPLAINT:  Confusion.  HISTORY OF PRESENT ILLNESS:  This is a 76 year old female with past medical history of bipolar disorder that comes in for confusion as per family as the patient cannot give history.  The patient has been more confused for the past 4 days.  Not eating or drinking well.  They have noted some episodes of confusion and agitation.  She had multiple history of psychiatric problems in the past, so they called her psychiatrist who has lowered her dose of Lamictal.  She continued to progressively get worse.  The family was suspicion for UTI , which was interfering with her mental status.  Apparently, they got an UA, which was negative.  So the family decided to bring her here to the ED.  Her in the ED, she is not able to give a history.  She has fluent language, but incoherent and cannot relate any type of history.  ALLERGIES: 1. PENICILLIN. 2. SULFA.  PAST MEDICAL HISTORY: 1. History of healthcare-associated pneumonia. 2. Dehydration. 3. Hypothyroidism. 4. Dementia. 5. Hyperlipidemia. 6. Major depressive disorder with bipolar disorder. 7. Hypertension. 8. Anxiety. 9. Vitamin D deficiency. 10.Osteoarthritis. 11.Peripheral neuropathy. 12.History of chronic kidney disease stage 3.  MEDICATIONS:  She is on, 1. Voltaren 1% topical. 2. Vitamin D3, 3000 units daily. 3. Simvastatin 20 mg daily. 4. Seroquel XR 50 mg every morning. 5. Senokot 8.6/50 mg p.o. 2 tablets at bedtime. 6. Celexa 6% topical daily. 7. Norvasc 5 mg daily. 8. Multivitamins 1 tablet daily. 9. MiraLax 17 g daily. 10.Lithium carbonate  600 mg p.o. at bedtime. 11.Lamotrigine 100 mg/0.5 mg bedtime. 12.Cymbalta 60 mg daily. 13.Cutivate 0.05% topical applied daily. 14.Cormax 0.5% to scalp b.i.d. 15.Calcium carbonate 500 mg p.o. daily. 16.Alprazolam 0.25 mg p.o. b.i.d. 17.Synthroid 137 mcg before breakfast daily. 18.Prilosec 20 mg daily. 19.Mucinex 600 mg tablet b.i.d. 20.Vicodin 1 tablet q.6 h. p.r.n. 21.Remedy Calazime 0.2 to 20% topical as needed.  FAMILY HISTORY:  Noncontributory.  SOCIAL HISTORY:  She lives in a nursing home.  No alcohol, tobacco, or drugs.  REVIEW OF SYSTEMS:  Ten-point review of systems done.  Pertinent positives per HPI.  PHYSICAL EXAMINATION:  VITAL SIGNS:  Temperature 98, pulse 72, and blood pressure 125/79.  She was saturating 98% on room air, breathing 19 times per minute. GENERAL:  She is incoherent with fluent language.  Not alert or oriented to time, place, or person. HEENT:  Dry mucous membrane.  Conjunctivae, no pallor. NECK:  No JVD.  No bruits.  No thyromegaly. CARDIOVASCULAR:  She has a regular rate and rhythm with positive S1 and S2.  No murmurs, rubs, or gallops. LUNGS:  Good air movement, clear to auscultation. ABDOMEN:  Positive bowel sounds, nontender, nondistended, and soft. EXTREMITIES:  Positive pulses.  No clubbing, cyanosis, or edema. NEUROLOGIC:  She  is moving all 4 extremities without any difficulty. III through XII are grossly apparently intact.  Sensation could not be addressed as she is not able to cooperate.  Deep tendon reflexes are 2+ bilaterally.  LABORATORY DATA:  Labs on admission showed troponin less than 0.30.  Her MB was 2.2 and her CK was 46.  Lipase was 40.  Her sodium was 145, potassium 4.4, chloride 110, bicarbonate of 20, glucose of 107, BUN of 27, creatinine 1.3, alkaline phosphatase 127, bilirubin 0.6, AST 60, ALT 56, total protein 7.9, albumin 3.8, and calcium 10.3.  Her white count is 13.8 with an ANC of 10.9, hemoglobin of 12.9 with an MCV  of 84, and platelet count of 356.  Her UA showed too many to count white blood cells and many bacteria.  Amorphous urate crystals.  Chest x-ray showed no acute cardiopulmonary disease, moderate hiatal hernia, extensive DJD changes in the shoulders.  CT scan of the head showed no acute intracranial abnormality, mass, lesions, or hydrocephalus.  ASSESSMENT AND PLAN: 1. Urinary tract infection.  This is the most likely cause of #2. We will     get blood culture and urine culture.  We will start on Rocephin.     We will continue IV fluids gently and we will reevaluate in the     morning. 2. Acute metabolic encephalopathy as per family members.  This is not     her baseline.  She is much confused than she has before.  This is     most likely secondary to her urinary tract infection.  She has had     trouble in the past with lithium toxicity, so we will check a     lithium level as this could be contributing to her encephalopathy.     At that time, it is pending and we will reevaluate in the morning.     If the lithium level is high, we will have to     consult Psychiatry for further management. 3. Chronic kidney disease.  Her baseline creatinine is 1.0 to 1.2.  It     is currently 1.3.  We will give a little bit of IV fluids and she     could be admitted in the morning.  All other medical problems were     currently stable.     Marinda Elk, M.D.     AF/MEDQ  D:  09/11/2010  T:  09/11/2010  Job:  [id number]  Electronically Signed by Marinda Elk M.D. on 09/12/2010 06:42:37 AM

## 2010-09-13 LAB — CBC
HCT: 43.4 % (ref 36.0–46.0)
Hemoglobin: 12.4 g/dL (ref 12.0–15.0)
MCH: 25.5 pg — ABNORMAL LOW (ref 26.0–34.0)
MCV: 89.1 fL (ref 78.0–100.0)
RBC: 4.87 MIL/uL (ref 3.87–5.11)
WBC: 7.3 10*3/uL (ref 4.0–10.5)

## 2010-09-13 LAB — BASIC METABOLIC PANEL
BUN: 18 mg/dL (ref 6–23)
BUN: 17 mg/dL (ref 6–23)
CO2: 22 mEq/L (ref 19–32)
CO2: 17 mEq/L — ABNORMAL LOW (ref 19–32)
Chloride: >130 mEq/L (ref 96–112)
Chloride: >130 mEq/L (ref 96–112)
Chloride: 127 mEq/L — ABNORMAL HIGH (ref 96–112)
Creatinine, Ser: 1.29 mg/dL — ABNORMAL HIGH (ref 0.50–1.10)
Creatinine, Ser: 1.26 mg/dL — ABNORMAL HIGH (ref 0.50–1.10)
GFR calc Af Amer: 49 mL/min — ABNORMAL LOW (ref >60)
GFR calc non Af Amer: 42 mL/min — ABNORMAL LOW (ref >60)
Glucose, Bld: 141 mg/dL — ABNORMAL HIGH (ref 70–99)
Glucose, Bld: 124 mg/dL — ABNORMAL HIGH (ref 70–99)
Glucose, Bld: 134 mg/dL — ABNORMAL HIGH (ref 70–99)
Potassium: 3.8 mEq/L (ref 3.5–5.1)
Potassium: 4.1 mEq/L (ref 3.5–5.1)
Sodium: 159 mEq/L — ABNORMAL HIGH (ref 135–145)

## 2010-09-13 LAB — TSH: TSH: 0.016 u[IU]/mL — ABNORMAL LOW (ref 0.350–4.500)

## 2010-09-14 LAB — CBC
HCT: 36 % (ref 36.0–46.0)
Hemoglobin: 10.3 g/dL — ABNORMAL LOW (ref 12.0–15.0)
Hemoglobin: 11.8 g/dL — ABNORMAL LOW (ref 12.0–15.0)
MCH: 26.1 pg (ref 26.0–34.0)
MCV: 88.7 fL (ref 78.0–100.0)
MCV: 88.5 fL (ref 78.0–100.0)
RBC: 4.52 MIL/uL (ref 3.87–5.11)
WBC: 6.7 10*3/uL (ref 4.0–10.5)

## 2010-09-14 LAB — BASIC METABOLIC PANEL
BUN: 16 mg/dL (ref 6–23)
BUN: 16 mg/dL (ref 6–23)
CO2: 24 mEq/L (ref 19–32)
CO2: 22 mEq/L (ref 19–32)
Calcium: 9.4 mg/dL (ref 8.4–10.5)
Chloride: 127 mEq/L — ABNORMAL HIGH (ref 96–112)
Creatinine, Ser: 1.22 mg/dL — ABNORMAL HIGH (ref 0.50–1.10)
GFR calc Af Amer: 46 mL/min — ABNORMAL LOW (ref >60)
Glucose, Bld: 107 mg/dL — ABNORMAL HIGH (ref 70–99)
Potassium: 3.9 mEq/L (ref 3.5–5.1)

## 2010-09-15 LAB — BASIC METABOLIC PANEL WITH GFR
BUN: 19 mg/dL (ref 6–23)
CO2: 26 meq/L (ref 19–32)
Calcium: 9.5 mg/dL (ref 8.4–10.5)
Chloride: 123 meq/L — ABNORMAL HIGH (ref 96–112)
Creatinine, Ser: 1.21 mg/dL — ABNORMAL HIGH (ref 0.50–1.10)
GFR calc Af Amer: 53 mL/min — ABNORMAL LOW
GFR calc non Af Amer: 44 mL/min — ABNORMAL LOW
Glucose, Bld: 108 mg/dL — ABNORMAL HIGH (ref 70–99)
Potassium: 4.4 meq/L (ref 3.5–5.1)
Sodium: 157 meq/L — ABNORMAL HIGH (ref 135–145)

## 2010-09-15 LAB — COMPREHENSIVE METABOLIC PANEL
AST: 46 U/L — ABNORMAL HIGH (ref 0–37)
Albumin: 2.7 g/dL — ABNORMAL LOW (ref 3.5–5.2)
CO2: 23 mEq/L (ref 19–32)
Calcium: 9.2 mg/dL (ref 8.4–10.5)
Creatinine, Ser: 1.07 mg/dL (ref 0.50–1.10)
GFR calc non Af Amer: 50 mL/min — ABNORMAL LOW (ref >60)

## 2010-09-15 LAB — CBC
MCH: 25.8 pg — ABNORMAL LOW (ref 26.0–34.0)
MCHC: 29.2 g/dL — ABNORMAL LOW (ref 30.0–36.0)
MCV: 88.4 fL (ref 78.0–100.0)
Platelets: 218 10*3/uL (ref 150–400)
RDW: 18.7 % — ABNORMAL HIGH (ref 11.5–15.5)

## 2010-09-16 LAB — CBC
HCT: 37 % (ref 36.0–46.0)
Hemoglobin: 10.8 g/dL — ABNORMAL LOW (ref 12.0–15.0)
MCHC: 29.2 g/dL — ABNORMAL LOW (ref 30.0–36.0)

## 2010-09-16 LAB — BASIC METABOLIC PANEL
BUN: 17 mg/dL (ref 6–23)
BUN: 16 mg/dL (ref 6–23)
CO2: 26 mEq/L (ref 19–32)
Chloride: 122 mEq/L — ABNORMAL HIGH (ref 96–112)
Creatinine, Ser: 0.95 mg/dL (ref 0.50–1.10)
GFR calc Af Amer: >60 mL/min (ref >60)
GFR calc non Af Amer: 55 mL/min — ABNORMAL LOW (ref >60)
Glucose, Bld: 118 mg/dL — ABNORMAL HIGH (ref 70–99)
Glucose, Bld: 101 mg/dL — ABNORMAL HIGH (ref 70–99)
Potassium: 3.6 mEq/L (ref 3.5–5.1)

## 2010-09-17 LAB — CBC
HCT: 38.4 % (ref 36.0–46.0)
Hemoglobin: 11.2 g/dL — ABNORMAL LOW (ref 12.0–15.0)
MCH: 25.9 pg — ABNORMAL LOW (ref 26.0–34.0)
MCHC: 29.2 g/dL — ABNORMAL LOW (ref 30.0–36.0)
MCV: 88.7 fL (ref 78.0–100.0)

## 2010-09-17 LAB — BASIC METABOLIC PANEL
BUN: 15 mg/dL (ref 6–23)
BUN: 16 mg/dL (ref 6–23)
Calcium: 9.8 mg/dL (ref 8.4–10.5)
Creatinine, Ser: 1.06 mg/dL (ref 0.50–1.10)
GFR calc Af Amer: >60 mL/min (ref >60)
GFR calc non Af Amer: 57 mL/min — ABNORMAL LOW (ref >60)
GFR calc non Af Amer: 51 mL/min — ABNORMAL LOW (ref >60)
Glucose, Bld: 109 mg/dL — ABNORMAL HIGH (ref 70–99)
Glucose, Bld: 202 mg/dL — ABNORMAL HIGH (ref 70–99)

## 2010-09-18 LAB — BASIC METABOLIC PANEL
BUN: 16 mg/dL (ref 6–23)
CO2: 27 mEq/L (ref 19–32)
Calcium: 9.7 mg/dL (ref 8.4–10.5)
Creatinine, Ser: 1.04 mg/dL (ref 0.50–1.10)
GFR calc Af Amer: >60 mL/min (ref >60)

## 2010-09-19 ENCOUNTER — Ambulatory Visit (HOSPITAL_COMMUNITY): Payer: Medicare Other

## 2010-09-19 ENCOUNTER — Ambulatory Visit (HOSPITAL_COMMUNITY): Admission: RE | Admit: 2010-09-19 | Payer: Medicare Other | Source: Ambulatory Visit

## 2010-09-19 LAB — BASIC METABOLIC PANEL
Calcium: 9 mg/dL (ref 8.4–10.5)
Creatinine, Ser: 0.9 mg/dL (ref 0.50–1.10)
GFR calc non Af Amer: >60 mL/min (ref >60)
Glucose, Bld: 113 mg/dL — ABNORMAL HIGH (ref 70–99)
Sodium: 147 mEq/L — ABNORMAL HIGH (ref 135–145)

## 2010-10-03 NOTE — Progress Notes (Signed)
[name], [name]                ACCOUNT NO.:  [id number]  MEDICAL RECORD NO.:  [id number]  LOCATION:  1530                         FACILITY:  [facility]  PHYSICIAN:  Manson Passey, MD        DATE OF BIRTH:  Sep 02, 1934                                PROGRESS NOTE   PRIMARY CARE DOCTOR: Dr. Selena Batten.  HISTORY OF PRESENT ILLNESS: The patient is a pleasant 76 year old female who was admitted on September 3rd to [facility] with confusion of 4 days duration and poor oral intake.  The patient came from skilled nursing facility where it was reported she has been seen by psychiatrist for confusion and agitation due to her multiple psychiatric problems in the past and psychiatrist lowered the dose of Lamictal. Per nursing report, she progressively got worse and was admitted to [facility] for further evaluation and management.  Upon admission, it was determined that symptoms were most likely secondary for urinary tract infection andurinalysis in ED indicated increased large leukocytes present, with many bacteria.  MEDICATIONS DURING THE HOSPITALIZATION: 1. Norvasc 5-mg tablet once daily (discontinued on September 15, 2010). 2. Calcium carbonate with vitamin D3 once daily. 3. Rocephin 1 g IV daily (starting date September 3rd, ending date     September 8th). 4. Ciprofloxacin 500-mg tablet twice daily (started September 8th). 5. Vitamin D3 1000 units once daily. 6. Voltaren topical application 4 times a day. 7. Cymbalta 60-mg tablet once daily. 8. Ensure twice daily. 9. Heparin 5000 units subcu every 8 hours for DVT prophylaxis. 10.Lamictal 50-mg tablet q.h.s. 11.Synthroid 137-mcg tablet once daily with meals. 12.Protonix 40-mg tablet once daily. 13.MiraLax 17 g powder with water twice daily. 14.Seroquel 50-mg tablet q.h.s. 15.Senokot 2 tablets q.h.s. 16.Zocor 20-mg tablet once daily. 17.Currently receiving IV fluids half normal saline at 100 cc/hr. 18.Xanax 0.25-mg tablet  twice daily as needed for agitation. 19.Oxycodone 5-mg tablet q.4 h. as needed for pain.  REVIEW OF SYSTEMS: Per HPI.  PHYSICAL EXAMINATION: VITAL SIGNS:  Temperature 98.3, blood pressure 137/84, heart rate 80, respirations 18, oxygen saturation 99% on room air. GENERAL: The patient is lying in bed, pleasant, cooperative to examination, not in acute distress. CARDIOVASCULAR SYSTEM:  Regular rate and rhythm, S1 and S2 present, no JVD. LUNGS: Decreased breath sounds at bases, otherwise clear to auscultation bilaterally.  No rhonchi or wheezing. ABDOMEN: Soft, nontender, nondistended.  Bowel sounds present.  No guarding. EXTREMITIES: No edema, no cyanosis. NEUROLOGIC EXAM:  Patient is alert and oriented to name and place, follows commands appropriately, no gross focal neurologic deficits.  LABORATORY DATA: September 8th, WBC 6.1, hemoglobin 10.8, hematocrit 37, platelets 236. Sodium 159, potassium 3.6, chloride 123, bicarb 27, BUN 17, creatinine 0.98, glucose 118, calcium 9.7.  Urine output on September 8th, 2300 cc.  ASSESSMENT AND PLAN: 1. Urinary tract infection - the patient's initial symptoms were     thought to be secondary to urinary tract infection.  She was     started on Rocephin 1 g IV daily and has completed 5-day course of     Rocephin.  Rocephin was discontinued on September 8th and she was     switched to oral  antibiotic, ciprofloxacin 500 mg twice daily     because of her history of complicated history of recurrent urinary     tract infections.  The patient's urinalysis was significant for     many bacteria and large leukocytes with blood present, however,     urine culture was negative for bacteria. 2. Hypernatremia - unclear etiology developed second day upon     hospitalization where sodium level was increased from the admitting     value 145 to 148 and subsequently to 165.  This was determined to     be multifactorial and questionably related to lithium (The  patient     was on lithium secondary to bipolar disorder).  In addition,     hypernatremia was thought to be secondary to dehydration.  The     patient was initially started on D5 half normal saline; however,     sodium levels did not significantly decrease, at which point she     was switched to half normal saline and trending down was noted.     The patient is maintaining good urine output and we are continuing     half normal saline at 100 cc/hr.  We are continuing to monitor     electrolytes panel. 3. Acute-on-chronic renal failure.  The patient was admitted on     September 3rd with a creatinine of 1.37.  With IV fluids, her     creatinine has decreased to her baseline and is currently within     normal limits on September 16, 2010.  She is maintaining good urine     output and we are continuing to monitor I's and O's as well as     daily electrolytes panel. 4. Psychiatric condition - multifactorial bipolar with depression, the     patient is on multiple psychiatric medications including Lamictal,     Cymbalta, Seroquel.  There is a possibility of hypernatremia     secondary to psychiatric medications.  We have determine to     discontinue Lamictal and to discontinue lithium.  She is followed     by Psychiatry.  We have also provided sitter due to intermittent     agitation.  Ativan p.r.n. if helpful in symptom control as well. 5. Hypertension - blood pressure has been stable during the course of     hospitalization.  Norvasc was initially started, but we     discontinued Norvasc since blood pressure was well controlled off     Norvasc.  Over 30 minutes spent on today's visit with the patient.          ______________________________ Manson Passey, MD     AD/MEDQ  D:  09/16/2010  T:  09/16/2010  Job:  [id number]  Electronically Signed by Manson Passey MD on 10/03/2010 05:21:32 PM

## 2010-10-03 NOTE — Discharge Summary (Signed)
NAMECHANTELLA, [name]                ACCOUNT NO.:  [id number]  MEDICAL RECORD NO.:  [id number]  LOCATION:  1530                         FACILITY:  [facility]  PHYSICIAN:  Manson Passey, MD        DATE OF BIRTH:  Oct 30, 1934  DATE OF ADMISSION:  09/11/2010 DATE OF DISCHARGE:  09/20/2010                              DISCHARGE SUMMARY   PRIMARY CARE PHYSICIAN:  Lenon Curt. Chilton Si, MD  DISCHARGE DIAGNOSES: 1. Acute mental status change - determined to be secondary to urinary     tract infection, resolved upon discharge. 2. Urinary tract infection - treated with 5 days of Rocephin IV,     completed treatment. 3. Hypernatremia - unclear etiology, sodium upon discharge 147. 4. Chronic kidney disease - baseline creatinine 1 to 1.2 with GFR in     50s.  PAST MEDICAL HISTORY: 1. History of hypothyroidism. 2. Dementia. 3. Hypertension. 4. Hyperlipidemia. 5. Major depressive disorder with bipolar disorder. 6. Anxiety. 7. Vitamin D deficiency. 8. Peripheral neuropathy. 9. Osteoarthritis.  DISCHARGE MEDICATIONS: 1. Desmopressin 4 mcg per mL injection dose 2 mcg IV daily. 2. Levothyroxine 112 mcg tablet once daily. 3. Xanax 0.25 mg tablet by mouth twice daily as needed for anxiety. 4. Cormax 0.5% topical apply to scalp twice daily as needed for     itching or flaking. 5. Fluticasone topical apply daily. 6. Cymbalta 60 mg capsule once daily. 7. Calcium carbonate with vitamin D (500/400 mg) take 2 tablets by     mouth daily. 8. Hydrocodone - APAP 5/500 mg tablet take 1 tablet every 6 hours as     needed for pain. 9. Mucinex 600 mg tablet twice daily as needed for congestion. 10.MiraLax powder 17 g once daily to twice daily by mouth. 11.Multivitamin tablet once daily. 12.Norvasc 5 mg tablet once daily. 13.Omeprazole 20 mg tablet once daily. 14.Menthol/zinc topically apply daily as needed. 15.Salex 6% apply to scalp every Monday. 16.Seroquel XR 50 mg tablet every evening. 17.Senna S 8.6/50 mg  tablet take 2 tablets by mouth daily at bedtime. 18.Simvastatin 20 mg tablet by mouth daily at bedtime. 19.Vitamin D3 1000 units take 1 tablet by mouth daily. 20.Voltaren gel 1% 4 g apply 4 times daily topically as needed.  DISPOSITION AND FOLLOWUP:  The patient was discharged from the hospital in stable condition with altered mental status resolving and the patient being at her baseline per the patient as well as per family members. Please note that following changes have been made to the list of medications, lithium has been discontinued due to severe hypernatremia, difficult to control with D5 fluids and half normal saline.  In addition, lamotrigine was discontinued as well.  Two medications that were continued during the hospitalization were Seroquel and Cymbalta at the dosage as noted above.  In addition, the patient was started on desmopressin, dose noted above.  Her sodium has decreased from 165 on September 13, 2010, to 147 on September 19, 2010.  This will need to be followed up in outpatient setting.  BMET will have to be obtained to ensure that sodium continues to trend down and that is within normal limits.  In addition, she will need  followup on her creatinine.  Her creatinine has remained stable within normal limits, however, note the GFR is in 50s and she has history of chronic kidney disease stage 3.  Of note, the patient has completed course of antibiotic treatment for urinary tract infection with Rocephin 1 g IV 5-day treatment and has received 2 extra days of treatment with ciprofloxacin 500 mg tablet twice daily.  In addition during the hospitalization, TSH was checked and it was 0.008.  Her levothyroxine dose was decreased from 137 mcg p.o. daily to 112 mcg p.o. daily.  She will need to have TSH repeated in outpatient setting in approximately 4-6 weeks.  Dose of levothyroxine can be adjusted if indicated based on TSH and symptoms.  HISTORY OF PRESENT ILLNESS:  The  patient is a very pleasant 76 year old female who was admitted on September 3rd at [facility] with confusion, lasting 4 days, and poor oral intake.  She came from nursing home facility where it was reported that she has been seen by a psychiatrist for confusion and agitation due to multiple psychiatric problems.  At that time, Lamictal dose was decreased.  Per nursing report, the patient was getting progressively worse and was admitted to [facility] for further evaluation and management.  PHYSICAL EXAMINATION:  VITAL SIGNS:  Blood pressure 92/58, pulse 80, respirations 20, temperature 98.1, oxygen saturation 100% on room air. GENERAL:  The patient is sitting in chair, not in acute distress, cooperative to examination, and following commands appropriately. LUNGS:  Clear to auscultation bilaterally.  No wheezing, rales, or rhonchi. CARDIOVASCULAR:  S1 and S2 present, regular rate and rhythm.  No JVD. ABDOMEN:  Bowel sounds present, soft, nontender, and nondistended. EXTREMITIES:  Pulses palpable bilaterally.  No edema or cyanosis. NEUROLOGIC:  The patient is alert and oriented to name and place, no gross focal neurologic deficits, following commands appropriately.  LABORATORY DATA:  Upon discharge, sodium 147, potassium 4, chloride 114, bicarbonate 24, glucose 113, BUN 16, creatinine 0.9, calcium 9.  CBC last obtained on September 17, 2010, WBC 6.7, hemoglobin 11.2, hematocrit 38.4, platelets 236.  DIAGNOSTIC STUDIES DURING THE HOSPITALIZATION:  September 3rd CT of the head without contrast, mild chronic small vessel white matter ischemic changes noted, but unchanged from previous studies, no acute intracranial abnormalities identified, including mass, lesions, or mass effects, hydrocephalous, extra-axial fluid collection, midline shift, hemorrhage, or acute infarction.  The visual bony calvarium is unremarkable and no evidence of acute intracranial abnormality. September 11, 2010, chest x-ray, no acute cardiopulmonary disease, moderate hiatal hernia, and excessive degenerative joint disease changes of the shoulders.  HOSPITAL COURSE BY PROBLEM: 1. Acute mental status change - this was determined to be secondary to     UTI, but is most likely multifactorial and in addition to urinary     tract infection, probably secondary to severe hypernatremia.  This     was difficult to correct during the hospitalization despite     multiple courses of D5W and half normal saline.  It was noted that     the patient had sodium on September 4th of 165 and after multiple     attempts of trying to correct sodium, hypernatremia, with fluids,     she was started on desmopressin.  Trending down sodium noted on     desmopressin.  This will have to be repeated in outpatient setting.     Acute mental status change has resolved and the patient was at her     baseline  per family report as well as per patient herself.  She has     completed course of antibiotics during the hospitalization. 2. Urinary tract infection - as per acute mental status change. 3. Hypernatremia - of note, the patient started on desmopressin and     will need to continue until sodium levels stabilize and to be     determined by primary care physician if this needs to be continued     in outpatient setting for prolonged period of time.  Also of note,     it was determined that lithium should be discontinued until her     sodium levels normalize and to be determined by psychiatrist and     primary care physician if this should be restarted or the patient     can potentially be switched to alternate medication given     hypernatremia.  Lamotrigine was also discontinued during the     hospitalization. 4. Hypothyroidism - during the hospitalization.  TSH was 0.008 and     dose of levothyroxine was decreased from 137 to 112 mcg p.o. daily.     This will have to be follow up in outpatient setting to determine     if  dose adjustments are indicated for Synthroid. 5. Depressive disorder with bipolar disorder - during the     hospitalization, the patient was continued on Seroquel and Cymbalta     and did well with no other acute changes in mental status.  It will     have to be determined on outpatient setting by primary PCP and     psychiatrist if other medications, Lamictal and lithium should be     restarted.  Over 30 minutes was spent on dictating discharge for patient.          ______________________________ Manson Passey, MD     AD/MEDQ  D:  09/19/2010  T:  09/19/2010  Job:  [id number]  cc:   Jetty Duhamel., M.D. Fax: [phone]  Lenon Curt. Chilton Si, M.D. Fax: [phone]  Electronically Signed by Manson Passey MD on 10/03/2010 05:21:29 PM

## 2011-02-01 ENCOUNTER — Encounter: Payer: Self-pay | Admitting: Family Medicine

## 2011-02-01 ENCOUNTER — Ambulatory Visit (INDEPENDENT_AMBULATORY_CARE_PROVIDER_SITE_OTHER): Payer: Medicare Other | Admitting: Family Medicine

## 2011-02-01 VITALS — BP 118/90 | HR 68 | Ht 62.0 in | Wt 127.0 lb

## 2011-02-01 DIAGNOSIS — F329 Major depressive disorder, single episode, unspecified: Secondary | ICD-10-CM | POA: Insufficient documentation

## 2011-02-01 DIAGNOSIS — R110 Nausea: Secondary | ICD-10-CM | POA: Insufficient documentation

## 2011-02-01 DIAGNOSIS — F3289 Other specified depressive episodes: Secondary | ICD-10-CM | POA: Insufficient documentation

## 2011-02-01 DIAGNOSIS — I10 Essential (primary) hypertension: Secondary | ICD-10-CM | POA: Insufficient documentation

## 2011-02-01 DIAGNOSIS — E039 Hypothyroidism, unspecified: Secondary | ICD-10-CM | POA: Insufficient documentation

## 2011-02-01 DIAGNOSIS — F0390 Unspecified dementia without behavioral disturbance: Secondary | ICD-10-CM

## 2011-02-01 NOTE — Progress Notes (Signed)
Patient presents accompanied by her daughter, with chief complaint of ongoing nausea.  She lives in a nursing home, and is under the care of Dr. Frederik Pear, as well as her psychiatrist Dr. Jennelle Human.  She was hospitalized with hyponatremia and UTI over Labor Day.  Lithium was stopped then, "cold [place]".  She sees Dr. Jennelle Human regularly. He has been aggressively changing her medications, but then started hallucinating, voices telling her she's going to die. He sent her to [facility] for ECT.  She was admitted to [facility] from 12/13 - 1/7.  It was felt like she wasn't a good candidate for ECT.  Med changes were made--they decreased her Cymbalta to 20mg .  Lithium was reportedly stopped, although it is on med list from [facility]. And she was changed from Seroquel to zyprexa, but both are listed on her med list.  It also appears that she was discharged on Pantoprazole 40mg , but her current med list from [facility] lists Prilosec 20mg .  She has been getting phenergan daily at [facility] for complaints of nausea.  Daughter presents for a consult regarding her medications, as she hasn't been getting answers.  Patient repeatedly states she "doesn't feel good", complaining of nausea.  Denies pain.  Past Medical History  Diagnosis Date  . Thyroid cancer   . Ulcer     chronic  . Depression   . Vitamin D deficiency    History   Social History  . Marital Status: Divorced    Spouse Name: N/A    Number of Children: N/A  . Years of Education: N/A   Occupational History  . Not on file.   Social History Main Topics  . Smoking status: Never Smoker   . Smokeless tobacco: Not on file  . Alcohol Use: No  . Drug Use: Not on file  . Sexually Active: Not on file   Other Topics Concern  . Not on file   Social History Narrative   Lives at [facility]    Current outpatient prescriptions:ALPRAZolam (XANAX) 0.25 MG tablet, Take 0.25 mg by mouth 2 (two) times daily., Disp: , Rfl: ;  amLODipine (NORVASC) 5 MG tablet, Take 5 mg  by mouth daily., Disp: , Rfl: ;  atorvastatin (LIPITOR) 10 MG tablet, Take 10 mg by mouth daily., Disp: , Rfl: ;  calcium-vitamin D (OSCAL WITH D) 500-200 MG-UNIT per tablet, Take 1 tablet by mouth daily., Disp: , Rfl:  cholecalciferol (VITAMIN D) 1000 UNITS tablet, Take 1,000 Units by mouth daily., Disp: , Rfl: ;  clobetasol (TEMOVATE) 0.05 % external solution, Apply 1 application topically 2 (two) times daily., Disp: , Rfl: ;  diclofenac sodium (VOLTAREN) 1 % GEL, Apply 1 application topically 4 (four) times daily., Disp: , Rfl: ;  DULoxetine (CYMBALTA) 20 MG capsule, Take 20 mg by mouth daily., Disp: , Rfl:  fluticasone (CUTIVATE) 0.05 % cream, Apply 1 application topically 2 (two) times daily., Disp: , Rfl: ;  guaiFENesin (MUCINEX) 600 MG 12 hr tablet, Take 1,200 mg by mouth 2 (two) times daily., Disp: , Rfl: ;  HYDROcodone-acetaminophen (VICODIN) 5-500 MG per tablet, Take 1 tablet by mouth every 6 (six) hours as needed., Disp: , Rfl: ;  levothyroxine (SYNTHROID, LEVOTHROID) 125 MCG tablet, Take 125 mcg by mouth daily., Disp: , Rfl:  lithium carbonate (ESKALITH) 450 MG CR tablet, Take 225 mg by mouth at bedtime., Disp: , Rfl: ;  Menthol-Zinc Oxide 0.2-20 % PSTE, Apply 1 each topically daily., Disp: , Rfl: ;  Multiple Vitamin (MULTIVITAMIN) tablet,  Take 1 tablet by mouth daily.  , Disp: , Rfl: ;  OLANZapine (ZYPREXA) 20 MG tablet, Take 20 mg by mouth at bedtime., Disp: , Rfl: ;  omeprazole (PRILOSEC) 20 MG capsule, Take 20 mg by mouth daily., Disp: , Rfl:  polyethylene glycol (MIRALAX / GLYCOLAX) packet, Take 17 g by mouth 2 (two) times daily., Disp: , Rfl: ;  promethazine (PHENERGAN) 25 MG tablet, Take 25 mg by mouth every 6 (six) hours as needed., Disp: , Rfl: ;  QUEtiapine (SEROQUEL) 300 MG tablet, Take 600 mg by mouth daily. At 6pm., Disp: , Rfl: ;  Salicylic Acid 6 % SHAM, Apply topically., Disp: , Rfl:  senna-docusate (SENNA S) 8.6-50 MG per tablet, Take 2 tablets by mouth at bedtime., Disp: , Rfl: ;   traZODone (DESYREL) 50 MG tablet, Take 50 mg by mouth at bedtime., Disp: , Rfl:   Allergies  Allergen Reactions  . Penicillins   . Sulfa Antibiotics    ROS:  Denies fevers, URI symptoms, shortness of breath.  Denies urinary complaints.  Daughter denies any change in mental status, is at baseline.  +dry lips and mouth, feeling thirsty.  Denies sore throat  PHYSICAL EXAM: BP 118/90  Pulse 68  Ht 5\' 2"  (1.575 m)  Wt 127 lb (57.607 kg)  BMI 23.23 kg/m2 Elderly female in wheelchair, in no distress Heart: regular rate and rhythm Lungs: clear bilaterally Abdomen: nontender, soft Lips--dry with some white coating.  OP dry, but no thrush or other lesions Neck: no lymphadenopathy  ASSESSMENT/PLAN: 1. Nausea   2. Depressive disorder, not elsewhere classified   3. Dementia   4. Essential hypertension, benign   5. Unspecified hypothyroidism    Current orders from her facility were reviewed, and compared with discharge summary from [facility].  Reviewed with daughter the discrepancies, which she should bring to attention of facility.  They need to pull the [facility] to see if she is in fact still getting lithium, and receiving both zyprexa and seroquel, as she shouldn't be.  Nausea may be related to her meds, although cymbalta dose has been decreased to lowest dose, or possibly related to the fact that it appears that she was discharged on 40 of pantoprazole, but is currently only on 20mg  of prilosec.   She will bring these discrepancies to the attention of nurses at [facility], as well as Dr. Jennelle Human and Dr. Chilton Si. She needs to have f/u TSH done through Dr Lucianne Muss, her endocrinologist, as dose was changed while in hospital.  Visit >30 minutes

## 2011-02-01 NOTE — Patient Instructions (Signed)
There are some discrepancies between the hospital discharge summary and her current medications that need to be addressed.  1) She should not be getting lithium 2) she should not be getting Seroquel (she was changed to Zyprexa instead) 3) she was discharged on pantoprazole 40mg , but is only getting omeprazole 20mg  (1/2 the strength of the acid reflux medication).  Acid reflux can contribute to belching and nausea, and she might do better on the higher dose of either omeprazole or pantoprazole.  Discharge summary said to stop xanax (on her med list as prn)  Her thyroid dose was increased from 112 to 125 during her hospitalization.  She will need to follow up with Dr. Lucianne Muss to have her thyroid bloodwork rechecked within the next month.

## 2011-02-14 ENCOUNTER — Inpatient Hospital Stay (HOSPITAL_COMMUNITY)
Admission: EM | Admit: 2011-02-14 | Discharge: 2011-02-24 | DRG: 698 | Disposition: A | Discharge status: Skilled Nursing Facility | Payer: Medicare Other | Attending: Internal Medicine | Admitting: Internal Medicine

## 2011-02-14 ENCOUNTER — Other Ambulatory Visit: Payer: Self-pay

## 2011-02-14 ENCOUNTER — Encounter (HOSPITAL_COMMUNITY): Payer: Self-pay | Admitting: Emergency Medicine

## 2011-02-14 DIAGNOSIS — E039 Hypothyroidism, unspecified: Secondary | ICD-10-CM | POA: Diagnosis present

## 2011-02-14 DIAGNOSIS — E870 Hyperosmolality and hypernatremia: Secondary | ICD-10-CM | POA: Diagnosis present

## 2011-02-14 DIAGNOSIS — N251 Nephrogenic diabetes insipidus: Principal | ICD-10-CM | POA: Diagnosis present

## 2011-02-14 DIAGNOSIS — G9341 Metabolic encephalopathy: Secondary | ICD-10-CM | POA: Diagnosis present

## 2011-02-14 DIAGNOSIS — Z880 Allergy status to penicillin: Secondary | ICD-10-CM

## 2011-02-14 DIAGNOSIS — F329 Major depressive disorder, single episode, unspecified: Secondary | ICD-10-CM | POA: Diagnosis present

## 2011-02-14 DIAGNOSIS — F3130 Bipolar disorder, current episode depressed, mild or moderate severity, unspecified: Secondary | ICD-10-CM | POA: Diagnosis present

## 2011-02-14 DIAGNOSIS — E876 Hypokalemia: Secondary | ICD-10-CM | POA: Diagnosis present

## 2011-02-14 DIAGNOSIS — R1110 Vomiting, unspecified: Secondary | ICD-10-CM | POA: Diagnosis not present

## 2011-02-14 DIAGNOSIS — Z882 Allergy status to sulfonamides status: Secondary | ICD-10-CM

## 2011-02-14 DIAGNOSIS — T438X5A Adverse effect of other psychotropic drugs, initial encounter: Secondary | ICD-10-CM | POA: Diagnosis present

## 2011-02-14 DIAGNOSIS — Z79899 Other long term (current) drug therapy: Secondary | ICD-10-CM

## 2011-02-14 DIAGNOSIS — I10 Essential (primary) hypertension: Secondary | ICD-10-CM | POA: Diagnosis present

## 2011-02-14 DIAGNOSIS — K920 Hematemesis: Secondary | ICD-10-CM

## 2011-02-14 DIAGNOSIS — K449 Diaphragmatic hernia without obstruction or gangrene: Secondary | ICD-10-CM | POA: Diagnosis present

## 2011-02-14 DIAGNOSIS — N179 Acute kidney failure, unspecified: Secondary | ICD-10-CM | POA: Diagnosis present

## 2011-02-14 DIAGNOSIS — G9340 Encephalopathy, unspecified: Secondary | ICD-10-CM

## 2011-02-14 DIAGNOSIS — Z23 Encounter for immunization: Secondary | ICD-10-CM

## 2011-02-14 LAB — DIFFERENTIAL
Eosinophils Absolute: 0 10*3/uL (ref 0.0–0.7)
Eosinophils Relative: 0 % (ref 0–5)
Lymphocytes Relative: 20 % (ref 12–46)
Lymphs Abs: 2.2 10*3/uL (ref 0.7–4.0)
Monocytes Absolute: 0.7 10*3/uL (ref 0.1–1.0)

## 2011-02-14 LAB — BASIC METABOLIC PANEL
CO2: 23 mEq/L (ref 19–32)
Calcium: 10 mg/dL (ref 8.4–10.5)
Calcium: 10 mg/dL (ref 8.4–10.5)
Creatinine, Ser: 1.59 mg/dL — ABNORMAL HIGH (ref 0.50–1.10)
GFR calc Af Amer: 38 mL/min — ABNORMAL LOW (ref >90)
GFR calc non Af Amer: 31 mL/min — ABNORMAL LOW (ref >90)
GFR calc non Af Amer: 33 mL/min — ABNORMAL LOW (ref >90)
Glucose, Bld: 96 mg/dL (ref 70–99)
Glucose, Bld: 107 mg/dL — ABNORMAL HIGH (ref 70–99)
Sodium: 171 mEq/L (ref 135–145)
Sodium: 169 mEq/L (ref 135–145)

## 2011-02-14 LAB — URINALYSIS, ROUTINE W REFLEX MICROSCOPIC
Bilirubin Urine: NEGATIVE
Glucose, UA: NEGATIVE mg/dL
Hgb urine dipstick: NEGATIVE
Ketones, ur: NEGATIVE mg/dL
Protein, ur: NEGATIVE mg/dL
pH: 7 (ref 5.0–8.0)

## 2011-02-14 LAB — LITHIUM LEVEL: Lithium Lvl: 0.44 mEq/L — ABNORMAL LOW (ref 0.80–1.40)

## 2011-02-14 LAB — URINE MICROSCOPIC-ADD ON

## 2011-02-14 LAB — CBC
HCT: 41.7 % (ref 36.0–46.0)
MCH: 23.9 pg — ABNORMAL LOW (ref 26.0–34.0)
MCV: 82.4 fL (ref 78.0–100.0)
Platelets: 268 10*3/uL (ref 150–400)
RBC: 5.06 MIL/uL (ref 3.87–5.11)
RDW: 18.8 % — ABNORMAL HIGH (ref 11.5–15.5)

## 2011-02-14 MED ORDER — SODIUM CHLORIDE 0.9 % IV SOLN
Freq: Once | INTRAVENOUS | Status: AC
  Administered 2011-02-14: 17:00:00 via INTRAVENOUS

## 2011-02-14 MED ORDER — ONDANSETRON HCL 4 MG/2ML IJ SOLN
4.0000 mg | Freq: Four times a day (QID) | INTRAMUSCULAR | Status: DC | PRN
  Administered 2011-02-15 – 2011-02-22 (×2): 4 mg via INTRAVENOUS
  Filled 2011-02-14 (×2): qty 2

## 2011-02-14 MED ORDER — ONDANSETRON HCL 4 MG PO TABS
4.0000 mg | ORAL_TABLET | Freq: Four times a day (QID) | ORAL | Status: DC | PRN
  Administered 2011-02-17: 4 mg via ORAL
  Filled 2011-02-14: qty 1

## 2011-02-14 MED ORDER — LEVOTHYROXINE SODIUM 125 MCG PO TABS
125.0000 ug | ORAL_TABLET | Freq: Every day | ORAL | Status: DC
  Administered 2011-02-15 – 2011-02-24 (×9): 125 ug via ORAL
  Filled 2011-02-14 (×12): qty 1

## 2011-02-14 MED ORDER — DICLOFENAC SODIUM 1 % TD GEL
1.0000 "application " | Freq: Four times a day (QID) | TRANSDERMAL | Status: DC
  Administered 2011-02-14 – 2011-02-19 (×17): 1 via TOPICAL
  Filled 2011-02-14 (×2): qty 100

## 2011-02-14 MED ORDER — PANTOPRAZOLE SODIUM 40 MG IV SOLR
40.0000 mg | Freq: Two times a day (BID) | INTRAVENOUS | Status: DC
  Administered 2011-02-14 – 2011-02-15 (×2): 40 mg via INTRAVENOUS
  Filled 2011-02-14 (×3): qty 40

## 2011-02-14 MED ORDER — DEXTROSE 5 % IV SOLN
INTRAVENOUS | Status: DC
  Administered 2011-02-14: 1000 mL via INTRAVENOUS
  Administered 2011-02-15 – 2011-02-16 (×3): via INTRAVENOUS

## 2011-02-14 MED ORDER — ALBUTEROL SULFATE (5 MG/ML) 0.5% IN NEBU: 2.5000 mg | INHALATION_SOLUTION | RESPIRATORY_TRACT | Status: DC | PRN

## 2011-02-14 MED ORDER — VITAMIN D3 25 MCG (1000 UNIT) PO TABS
1000.0000 [IU] | ORAL_TABLET | Freq: Every day | ORAL | Status: DC
  Administered 2011-02-15 – 2011-02-24 (×8): 1000 [IU] via ORAL
  Filled 2011-02-14 (×10): qty 1

## 2011-02-14 NOTE — ED Notes (Signed)
Critical value Serum osmolarity 352 given to Dr Loretha Stapler.

## 2011-02-14 NOTE — H&P (Signed)
Hospital Admission Note Date: 02/14/2011  PCP: No primary provider on file.  Chief Complaint:Abnormal lab work.  History of Present Illness: 77 year old with PMH Depression, lithium induce Diabetes insipidus who was transfer from nursing Home facility due to abnormal labs. Per nurse from  The  Facility patient was receiving lithium. Patient had Lab work done that show increase sodium level. Patient received 2 L of D 5 1/2 ns, sodium level was repeated and it was still high at 167. Patient mentation has decline for months. Patient has not been eating or drinking.    Allergies: Penicillins and Sulfa antibiotics Past Medical History  Diagnosis Date  . Thyroid cancer   . Ulcer     chronic  . Depression   . Vitamin d deficiency    Prior to Admission medications   Medication Sig Start Date End Date Taking? Authorizing Provider  amLODipine (NORVASC) 5 MG tablet Take 5 mg by mouth daily.   Yes Historical Provider, MD  aspirin EC 81 MG tablet Take 81 mg by mouth daily.   Yes Historical Provider, MD  atorvastatin (LIPITOR) 10 MG tablet Take 10 mg by mouth at bedtime.    Yes Historical Provider, MD  calcium-vitamin D (OSCAL WITH D) 500-200 MG-UNIT per tablet Take 2 tablets by mouth daily.    Yes Historical Provider, MD  cholecalciferol (VITAMIN D) 1000 UNITS tablet Take 1,000 Units by mouth daily.   Yes Historical Provider, MD  clobetasol (TEMOVATE) 0.05 % external solution Apply 1 application topically 2 (two) times daily.   Yes Historical Provider, MD  Dextrose-Sodium Chloride (DEXTROSE 5 % AND 0.45% NACL) 5-0.45 % Inject 2,000 mLs into the vein once. Infused at 100cc/hr Infusion completed 02/14/11 a.m.   Yes Historical Provider, MD  diclofenac sodium (VOLTAREN) 1 % GEL Apply 1 application topically 4 (four) times daily. Apply 4 grams to left shoulder and left knee    Yes Historical Provider, MD  DULoxetine (CYMBALTA) 20 MG capsule Take 20 mg by mouth daily.   Yes Historical Provider, MD    fluticasone (CUTIVATE) 0.05 % cream Apply 1 application topically 2 (two) times daily.   Yes Historical Provider, MD  lansoprazole (PREVACID) 30 MG capsule Take 30 mg by mouth 2 (two) times daily.   Yes Historical Provider, MD  levothyroxine (SYNTHROID, LEVOTHROID) 125 MCG tablet Take 125 mcg by mouth daily.   Yes Historical Provider, MD  lithium carbonate (ESKALITH) 450 MG CR tablet Take 225 mg by mouth at bedtime.   Yes Historical Provider, MD  megestrol (MEGACE ES) 625 MG/5ML suspension Take 625 mg by mouth daily.   Yes Historical Provider, MD  Menthol-Zinc Oxide 0.2-20 % PSTE Apply 1 each topically daily. For after incontinence care   Yes Historical Provider, MD  Multiple Vitamin (MULTIVITAMIN) tablet Take 1 tablet by mouth daily.     Yes Historical Provider, MD  OLANZapine (ZYPREXA) 20 MG tablet Take 20 mg by mouth at bedtime.   Yes Historical Provider, MD  polyethylene glycol (MIRALAX / GLYCOLAX) packet Take 17 g by mouth 2 (two) times daily.   Yes Historical Provider, MD  QUEtiapine (SEROQUEL XR) 300 MG 24 hr tablet Take 600 mg by mouth daily at 6 PM.   Yes Historical Provider, MD  Salicylic Acid 6 % SHAM Apply 1 application topically 2 (two) times a week. Monday and Wednesday   Yes Historical Provider, MD  senna-docusate (SENNA S) 8.6-50 MG per tablet Take 2 tablets by mouth at bedtime.   Yes Historical  Provider, MD  traZODone (DESYREL) 50 MG tablet Take 50 mg by mouth at bedtime.   Yes Historical Provider, MD  ALPRAZolam (XANAX) 0.25 MG tablet Take 0.25 mg by mouth 3 (three) times daily as needed. anxiety    Historical Provider, MD  guaiFENesin (MUCINEX) 600 MG 12 hr tablet Take 1,200 mg by mouth 2 (two) times daily as needed. congestion    Historical Provider, MD  HYDROcodone-acetaminophen (VICODIN) 5-500 MG per tablet Take 1 tablet by mouth every 6 (six) hours as needed. Pain     Historical Provider, MD  promethazine (PHENERGAN) 25 MG tablet Take 25 mg by mouth every 6 (six) hours as  needed. nausea    Historical Provider, MD   No past surgical history on file. No family history on file. History   Social History  . Marital Status: Divorced    Spouse Name: N/A    Number of Children: N/A  . Years of Education: N/A   Occupational History  . Not on file.   Social History Main Topics  . Smoking status: Never Smoker   . Smokeless tobacco: Not on file  . Alcohol Use: No  . Drug Use: Not on file  . Sexually Active: Not on file   Other Topics Concern  . Not on file   Social History Narrative   Lives at [facility]    REVIEW OF SYSTEMS:  Unable to obtain , patient not very cooperative.   Physical Exam: Filed Vitals:   02/14/11 1630 02/14/11 1715 02/14/11 1800 02/14/11 1907  BP: 130/85 126/86 122/95 138/85  Pulse: 108 108 106 104  Temp:      TempSrc:      Resp: 23 28 24 21   Weight:      SpO2: 98% 98% 97% 98%   No intake or output data in the 24 hours ending 02/14/11 2005 BP 138/85  Pulse 104  Temp(Src) 99.9 F (37.7 C) (Oral)  Resp 21  Wt 57.607 kg (127 lb)  SpO2 98%  General Appearance:    Ill appearing, confused, flat affect.   Head:    Normocephalic, without obvious abnormality, atraumatic  Eyes:    PERRL, conjunctiva/corneas clear,      Ears:    Normal TM's and external ear canals, both ears  Nose:   Nares normal, septum midline, mucosa normal, no drainage    or sinus tenderness  Throat:   Lips, mucosa, and tongue very dry; poor dentition.  Neck:   Supple, symmetrical, trachea midline, no adenopathy;    thyroid:  no enlargement/tenderness/nodules; no carotid   bruit or JVD     Lungs:     Clear to auscultation bilaterally, respirations unlabored      Heart:    Regular rate and rhythm, S1 and S2 normal, no murmur, rub   or gallop     Abdomen:     Soft, non-tender, bowel sounds active all four quadrants,    no masses, no organomegaly        Extremities:   Extremities normal, atraumatic, no cyanosis or edema  Pulses:   2+ and  symmetric all extremities  Skin:   Skin color, texture, turgor normal, no rashes or lesions  Lymph nodes:   Cervical, supraclavicular, and axillary nodes normal  Neurologic:    Patient is confuse, doesn't know where she is at, open eyes to command , fall sleep easily, move all extremities, follow some command.    Lab results:  [facility] 02/14/11 1644  NA 171*  K 3.9  CL >130*  CO2 23  GLUCOSE 96  BUN 30*  CREATININE 1.59*  CALCIUM 10.0  MG --  PHOS --    Basename 02/14/11 1644  WBC 11.1*  NEUTROABS 8.2*  HGB 12.1  HCT 41.7  MCV 82.4  PLT 268   Imaging results:  No results found. Other results: MVH:QIONG tachy, left anterior fascicular block.   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 I spoke with patient 's nurse from facility and patient has been receiving lithium. There is component of poor oral intake.  I will discontinue lithium. B-met every 2 hour. IV fluids D 5, low rate to avoid overcorrection of sodium.   Encephalopathy , metabolic. Per nursing home nurse patient has decline over  last months. Patient has history of depression. The nurse relate since patient arrived to facility she has been confuse, depress, not eating, memory problem. Her mentation is probably  worse secondary to hypernatremia.   Essential hypertension, benign (02/01/2011) Hold BP medications.  Depressive disorder, not elsewhere classified (02/01/2011)  I will hold all sedative and psychiatric medications. Her medications will need to be readdress in AM.  Unspecified hypothyroidism  Continue with synthroid. I will check TSH.   Acute renal failure: pre renal due to decrease volume.   Admit to Step Down due to AMS and hypernatremia.    REGALADO,BELKYS M.D. [facility] [phone] 02/14/2011, 8:05 PM

## 2011-02-14 NOTE — ED Provider Notes (Signed)
History     CSN: [id number]  Arrival date & time 02/14/11  1603   First MD Initiated Contact with Patient 02/14/11 1608      Chief Complaint  Patient presents with  . Abnormal Lab    sodium 167, chloride 130    (Consider location/radiation/quality/duration/timing/severity/associated sxs/prior treatment) HPI Comments: 77 yo female with hx of Depression presenting from [facility] nursing facility with hypernatremia and hyperchloremia.  Nursing home reports that her Na has been elevated for the past several days.  She has been receiving IV D51/2NS for past two days.  However, repeat lab draw today showed worsening Na level to 167 and she was sent to the ED for further management.  On arrival, she repeats the question "can I have some water" over and over.  She will follow commands.  She denies specific complaints, but states she feels bad.   Patient is a 77 y.o. female presenting with altered mental status. The history is provided by the patient, the nursing home and the EMS personnel. The history is limited by the condition of the patient.  Altered Mental Status This is a chronic problem. The current episode started 1 to 4 weeks ago. The problem occurs constantly. The problem has been unchanged.    Past Medical History  Diagnosis Date  . Thyroid cancer   . Ulcer     chronic  . Depression   . Vitamin d deficiency     No past surgical history on file.  No family history on file.  History  Substance Use Topics  . Smoking status: Never Smoker   . Smokeless tobacco: Not on file  . Alcohol Use: No    OB History    Grav Para Term Preterm Abortions TAB SAB Ect Mult Living                  Review of Systems  Unable to perform ROS: Mental status change  Psychiatric/Behavioral: Positive for altered mental status.    Allergies  Penicillins and Sulfa antibiotics  Home Medications   Current Outpatient Rx  Name Route Sig Dispense Refill  . ALPRAZOLAM 0.25 MG PO TABS Oral  Take 0.25 mg by mouth 2 (two) times daily.    Marland Kitchen AMLODIPINE BESYLATE 5 MG PO TABS Oral Take 5 mg by mouth daily.    . ATORVASTATIN CALCIUM 10 MG PO TABS Oral Take 10 mg by mouth daily.    Marland Kitchen CALCIUM CARBONATE-VITAMIN D 500-200 MG-UNIT PO TABS Oral Take 1 tablet by mouth daily.    Marland Kitchen VITAMIN D 1000 UNITS PO TABS Oral Take 1,000 Units by mouth daily.    Marland Kitchen CLOBETASOL PROPIONATE 0.05 % EX SOLN Topical Apply 1 application topically 2 (two) times daily.    Marland Kitchen DICLOFENAC SODIUM 1 % TD GEL Topical Apply 1 application topically 4 (four) times daily.    . DULOXETINE HCL 20 MG PO CPEP Oral Take 20 mg by mouth daily.    Marland Kitchen FLUTICASONE PROPIONATE 0.05 % EX CREA Topical Apply 1 application topically 2 (two) times daily.    . GUAIFENESIN ER 600 MG PO TB12 Oral Take 1,200 mg by mouth 2 (two) times daily.    Marland Kitchen HYDROCODONE-ACETAMINOPHEN 5-500 MG PO TABS Oral Take 1 tablet by mouth every 6 (six) hours as needed.    Marland Kitchen LEVOTHYROXINE SODIUM 125 MCG PO TABS Oral Take 125 mcg by mouth daily.    Marland Kitchen LITHIUM CARBONATE ER 450 MG PO TBCR Oral Take 225 mg by mouth at  bedtime.    Marland Kitchen MENTHOL-ZINC OXIDE 0.2-20 % EX PSTE Apply externally Apply 1 each topically daily.    Marland Kitchen ONE-DAILY MULTI VITAMINS PO TABS Oral Take 1 tablet by mouth daily.      Marland Kitchen OLANZAPINE 20 MG PO TABS Oral Take 20 mg by mouth at bedtime.    . OMEPRAZOLE 20 MG PO CPDR Oral Take 20 mg by mouth daily.    Marland Kitchen POLYETHYLENE GLYCOL 3350 PO PACK Oral Take 17 g by mouth 2 (two) times daily.    Marland Kitchen PROMETHAZINE HCL 25 MG PO TABS Oral Take 25 mg by mouth every 6 (six) hours as needed.    Marland Kitchen QUETIAPINE FUMARATE 300 MG PO TABS Oral Take 600 mg by mouth daily. At 6pm.    . SALICYLIC ACID 6 % EX SHAM Apply externally Apply topically.    Bernadette Hoit SODIUM 8.6-50 MG PO TABS Oral Take 2 tablets by mouth at bedtime.    . TRAZODONE HCL 50 MG PO TABS Oral Take 50 mg by mouth at bedtime.      BP 132/90  Pulse 100  Temp(Src) 99.9 F (37.7 C) (Oral)  Resp 20  Wt 127 lb  (57.607 kg)  SpO2 97%  Physical Exam  Nursing note and vitals reviewed. Constitutional: She is oriented to person, place, and time. She appears well-developed. No distress.       Appears dehydrated.  Obtunded.  HENT:  Head: Normocephalic and atraumatic.  Mouth/Throat: Oropharynx is clear and moist. Mucous membranes are dry.  Eyes: Conjunctivae are normal. No scleral icterus. Right pupil is not round. Right pupil is reactive. Left pupil is not round. Left pupil is reactive.  Neck: Neck supple.  Cardiovascular: Normal rate, regular rhythm, normal heart sounds and intact distal pulses.   No murmur heard. Pulmonary/Chest: Effort normal and breath sounds normal. No stridor. No respiratory distress. She has no rales.  Abdominal: Soft. Bowel sounds are normal. She exhibits no distension. There is no tenderness.  Musculoskeletal: Normal range of motion.  Neurological: She is alert and oriented to person, place, and time.       Follows commands.  States she does not know where she is or what year it is. She does know her birthdate and her name.    Skin: Skin is warm and dry. No rash noted.  Psychiatric: Her speech is delayed. She is withdrawn. She exhibits a depressed mood.    ED Course  Procedures (including critical care time)  Labs Reviewed  CBC - Abnormal; Notable for the following:    WBC 11.1 (*)    MCH 23.9 (*)    MCHC 29.0 (*)    RDW 18.8 (*)    All other components within normal limits  DIFFERENTIAL - Abnormal; Notable for the following:    Neutro Abs 8.2 (*)    All other components within normal limits  BASIC METABOLIC PANEL - Abnormal; Notable for the following:    Sodium 171 (*)    Chloride >130 (*)    BUN 30 (*)    Creatinine, Ser 1.59 (*)    GFR calc non Af Amer 31 (*)    GFR calc Af Amer 36 (*)    All other components within normal limits  URINALYSIS, ROUTINE W REFLEX MICROSCOPIC - Abnormal; Notable for the following:    Leukocytes, UA SMALL (*)    All other  components within normal limits  OSMOLALITY, URINE - Abnormal; Notable for the following:    Osmolality, Ur 317 (*)  All other components within normal limits  OSMOLALITY - Abnormal; Notable for the following:    Osmolality 352 (*)    All other components within normal limits  LITHIUM LEVEL - Abnormal; Notable for the following:    Lithium Lvl 0.44 (*)    All other components within normal limits  URINE MICROSCOPIC-ADD ON - Abnormal; Notable for the following:    Squamous Epithelial / LPF FEW (*)    All other components within normal limits  BASIC METABOLIC PANEL - Abnormal; Notable for the following:    Sodium 169 (*)    Chloride >130 (*)    Glucose, Bld 107 (*)    BUN 27 (*)    Creatinine, Ser 1.51 (*)    GFR calc non Af Amer 33 (*)    GFR calc Af Amer 38 (*)    All other components within normal limits  LACTIC ACID, PLASMA  BASIC METABOLIC PANEL  BASIC METABOLIC PANEL  BASIC METABOLIC PANEL  BASIC METABOLIC PANEL  BASIC METABOLIC PANEL  BASIC METABOLIC PANEL  TSH  CBC  PROTIME-INR  BASIC METABOLIC PANEL  MRSA PCR SCREENING  BASIC METABOLIC PANEL  BASIC METABOLIC PANEL  BASIC METABOLIC PANEL  BASIC METABOLIC PANEL  BASIC METABOLIC PANEL  BASIC METABOLIC PANEL   No results found.   Date: 02/14/2011  Rate: 107  Rhythm: sinus tachycardia  QRS Axis: left  Intervals: normal  ST/T Wave abnormalities: normal  Conduction Disutrbances:left anterior fascicular block  Narrative Interpretation:   Old EKG Reviewed: increased rate    1. Hypernatremia       MDM  77 yo female sent from nursing facility secondary to hypernatremia and hyperchloremia.  Mental status at baseline according to nursing facility.  They note that she has been depressed and has "given up".  She is mildly tachycardic with normal BPs.  Not in distress.  She appears very dehydrated. Labs pending.    Labs show significant hypernatremia.  Consulted internal medicine who will admit to stepdown unit  for further management.       Warnell Forester, MD 02/15/11 [phone]

## 2011-02-14 NOTE — ED Notes (Signed)
Report given and care endorsed to Woody, RN 

## 2011-02-14 NOTE — ED Notes (Signed)
Pt brought to the ER via EMS regarding abnormal lab work.

## 2011-02-14 NOTE — ED Notes (Signed)
All specimens have been collected and sent to the lab for testing.  Pt resting quietly with eyes closed, respirations equal and nonlabored.

## 2011-02-14 NOTE — ED Notes (Signed)
Patient is resting comfortably. NAD, VSS.

## 2011-02-14 NOTE — ED Notes (Signed)
Critical sero osmo 352 value given to [facility]

## 2011-02-14 NOTE — ED Notes (Signed)
Pt very confused, unknown baseline regarding orientation.  Follows commands but is not oriented to person, place, time or situatoin.  Will continue to monitor.

## 2011-02-14 NOTE — ED Notes (Signed)
VSS, NAD

## 2011-02-15 LAB — BASIC METABOLIC PANEL
BUN: 27 mg/dL — ABNORMAL HIGH (ref 6–23)
BUN: 28 mg/dL — ABNORMAL HIGH (ref 6–23)
CO2: 23 mEq/L (ref 19–32)
CO2: 22 mEq/L (ref 19–32)
CO2: 19 mEq/L (ref 19–32)
Calcium: 10 mg/dL (ref 8.4–10.5)
Calcium: 9.9 mg/dL (ref 8.4–10.5)
Calcium: 9.7 mg/dL (ref 8.4–10.5)
Calcium: 9.2 mg/dL (ref 8.4–10.5)
Calcium: 9 mg/dL (ref 8.4–10.5)
Chloride: >130 mEq/L (ref 96–112)
Chloride: >130 mEq/L (ref 96–112)
Chloride: 129 mEq/L — ABNORMAL HIGH (ref 96–112)
Chloride: 127 mEq/L — ABNORMAL HIGH (ref 96–112)
Chloride: 126 mEq/L — ABNORMAL HIGH (ref 96–112)
Creatinine, Ser: 1.54 mg/dL — ABNORMAL HIGH (ref 0.50–1.10)
Creatinine, Ser: 1.62 mg/dL — ABNORMAL HIGH (ref 0.50–1.10)
GFR calc Af Amer: 36 mL/min — ABNORMAL LOW (ref >90)
GFR calc Af Amer: 37 mL/min — ABNORMAL LOW (ref >90)
GFR calc Af Amer: 37 mL/min — ABNORMAL LOW (ref >90)
GFR calc Af Amer: 36 mL/min — ABNORMAL LOW (ref >90)
GFR calc Af Amer: 35 mL/min — ABNORMAL LOW (ref >90)
GFR calc Af Amer: 35 mL/min — ABNORMAL LOW (ref >90)
GFR calc Af Amer: 33 mL/min — ABNORMAL LOW (ref >90)
GFR calc Af Amer: 34 mL/min — ABNORMAL LOW (ref >90)
GFR calc Af Amer: 33 mL/min — ABNORMAL LOW (ref >90)
GFR calc Af Amer: 34 mL/min — ABNORMAL LOW (ref >90)
GFR calc non Af Amer: 31 mL/min — ABNORMAL LOW (ref >90)
GFR calc non Af Amer: 31 mL/min — ABNORMAL LOW (ref >90)
GFR calc non Af Amer: 32 mL/min — ABNORMAL LOW (ref >90)
GFR calc non Af Amer: 30 mL/min — ABNORMAL LOW (ref >90)
GFR calc non Af Amer: 30 mL/min — ABNORMAL LOW (ref >90)
GFR calc non Af Amer: 29 mL/min — ABNORMAL LOW (ref >90)
GFR calc non Af Amer: 29 mL/min — ABNORMAL LOW (ref >90)
GFR calc non Af Amer: 28 mL/min — ABNORMAL LOW (ref >90)
Glucose, Bld: 174 mg/dL — ABNORMAL HIGH (ref 70–99)
Potassium: 3.9 mEq/L (ref 3.5–5.1)
Potassium: 4.1 mEq/L (ref 3.5–5.1)
Potassium: 3.9 mEq/L (ref 3.5–5.1)
Potassium: 3.9 mEq/L (ref 3.5–5.1)
Potassium: 3.4 mEq/L — ABNORMAL LOW (ref 3.5–5.1)
Potassium: 3.7 mEq/L (ref 3.5–5.1)
Potassium: 3.7 mEq/L (ref 3.5–5.1)
Sodium: 168 mEq/L (ref 135–145)
Sodium: 169 mEq/L (ref 135–145)
Sodium: 168 mEq/L (ref 135–145)
Sodium: 166 mEq/L (ref 135–145)
Sodium: 163 mEq/L (ref 135–145)
Sodium: 158 mEq/L — ABNORMAL HIGH (ref 135–145)
Sodium: 158 mEq/L — ABNORMAL HIGH (ref 135–145)
Sodium: 156 mEq/L — ABNORMAL HIGH (ref 135–145)

## 2011-02-15 LAB — SODIUM, URINE, RANDOM: Sodium, Ur: 53 mEq/L

## 2011-02-15 LAB — CBC
Hemoglobin: 10.8 g/dL — ABNORMAL LOW (ref 12.0–15.0)
RBC: 4.47 MIL/uL (ref 3.87–5.11)

## 2011-02-15 LAB — PROTIME-INR
INR: 1.2 (ref 0.00–1.49)
Prothrombin Time: 15.5 seconds — ABNORMAL HIGH (ref 11.6–15.2)

## 2011-02-15 LAB — TSH: TSH: 0.63 u[IU]/mL (ref 0.350–4.500)

## 2011-02-15 LAB — OSMOLALITY, URINE: Osmolality, Ur: 324 mOsm/kg — ABNORMAL LOW (ref 390–1090)

## 2011-02-15 MED ORDER — DESMOPRESSIN ACETATE 4 MCG/ML IJ SOLN
2.0000 ug | Freq: Two times a day (BID) | INTRAMUSCULAR | Status: DC
  Filled 2011-02-15 (×2): qty 0.5

## 2011-02-15 MED ORDER — MEGESTROL ACETATE 40 MG/ML PO SUSP
800.0000 mg | Freq: Every day | ORAL | Status: DC
  Administered 2011-02-15 – 2011-02-24 (×8): 800 mg via ORAL
  Filled 2011-02-15 (×10): qty 20

## 2011-02-15 MED ORDER — PANTOPRAZOLE SODIUM 20 MG PO TBEC
20.0000 mg | DELAYED_RELEASE_TABLET | Freq: Every day | ORAL | Status: DC
  Administered 2011-02-15 – 2011-02-21 (×7): 20 mg via ORAL
  Filled 2011-02-15 (×8): qty 1

## 2011-02-15 MED ORDER — ASPIRIN EC 81 MG PO TBEC
81.0000 mg | DELAYED_RELEASE_TABLET | Freq: Every day | ORAL | Status: DC
  Administered 2011-02-15 – 2011-02-21 (×6): 81 mg via ORAL
  Filled 2011-02-15 (×8): qty 1

## 2011-02-15 MED ORDER — SIMVASTATIN 20 MG PO TABS
20.0000 mg | ORAL_TABLET | Freq: Every day | ORAL | Status: DC
  Administered 2011-02-15 – 2011-02-24 (×8): 20 mg via ORAL
  Filled 2011-02-15 (×10): qty 1

## 2011-02-15 MED ORDER — ACETAMINOPHEN 325 MG PO TABS
650.0000 mg | ORAL_TABLET | Freq: Four times a day (QID) | ORAL | Status: DC | PRN
  Administered 2011-02-15: 650 mg via ORAL
  Filled 2011-02-15: qty 2

## 2011-02-15 MED ORDER — MEGESTROL ACETATE 625 MG/5ML PO SUSP: 625.0000 mg | Freq: Every day | ORAL | Status: DC

## 2011-02-15 NOTE — Progress Notes (Signed)
   CARE MANAGEMENT NOTE 02/15/2011  Patient:  [name], [name]   Account Number:  [id number]  Date Initiated:  02/15/2011  Documentation initiated by:  Onnie Boer  Subjective/Objective Assessment:   PT WAS ADMITTED WITH HJYPERNATREMIA     Action/Plan:   PROGRESSION OF CARE AND DISCHARGE PLANNING   Anticipated DC Date:  02/20/2011   Anticipated DC Plan:  SKILLED NURSING FACILITY  In-house referral  Clinical Social Worker      DC Planning Services  CM consult      Choice offered to / List presented to:             Status of service:  In process, will continue to follow Medicare Important Message given?   (If response is "NO", the following Medicare IM given date fields will be blank) Date Medicare IM given:   Date Additional Medicare IM given:    Discharge Disposition:    Per UR Regulation:  Reviewed for med. necessity/level of care/duration of stay  Comments:  UR COMPLETED.  Onnie Boer, RN, BSN 02/15/11 1517 PT WAS ADMITTED FOR HYPERNATREMIA FROM [facility].  WILL F/U ON DC NEEDS

## 2011-02-15 NOTE — Progress Notes (Addendum)
.  Clinical social worker completed patient psychosocial assessment, please see assessment in patient shadow chart.  Pt daughter states pt plans to return to [facility] when medically stable. CSW will complete FL2 for MD signature and will place in pt shadow chart. .Clinical social worker continuing to follow pt to assist with pt dc plans and further csw needs.    Catha Gosselin, Theresia Majors  [phone] .02/15/2011 13:44pm

## 2011-02-15 NOTE — ED Provider Notes (Signed)
I saw and evaluated the patient, reviewed the resident's note and I agree with the findings and plan.  History of lithium induced diabetes insipidus presenting with sodium of 171. Decreased mentation and protecting airway. Extremely dry mucous membranes.  CRITICAL CARE Performed by: Glynn Octave   Total critical care time: 30  Critical care time was exclusive of separately billable procedures and treating other patients.  Critical care was necessary to treat or prevent imminent or life-threatening deterioration.  Critical care was time spent personally by me on the following activities: development of treatment plan with patient and/or surrogate as well as nursing, discussions with consultants, evaluation of patient's response to treatment, examination of patient, obtaining history from patient or surrogate, ordering and performing treatments and interventions, ordering and review of laboratory studies, ordering and review of radiographic studies, pulse oximetry and re-evaluation of patient's condition.   Glynn Octave, MD 02/15/11 [phone]

## 2011-02-15 NOTE — Progress Notes (Signed)
Subjective: The patient is quite somnolent and not able to hold a conversation. It is difficult to tell if she is oriented as she is not answering most of my questions. When asked where she is, she states "I don't know."  Objective: Blood pressure 133/85, pulse 95, temperature 98.7 F (37.1 C), temperature source Oral, resp. rate 29, height 5\' 6"  (1.676 m), weight 59.1 kg (130 lb 4.7 oz), SpO2 97.00%. Weight change:   Intake/Output Summary (Last 24 hours) at 02/15/11 1425 Last data filed at 02/15/11 1300  Gross per 24 hour  Intake   1562 ml  Output    625 ml  Net    937 ml    Physical Exam: General appearance: no distress, uncooperative and very sleepy. Awakens easily but also falls asleep quickly. She does try to follow some commands but again falls asleep quickly.  Throat: lips, mucosa, and tongue normal dry Lungs: clear to auscultation bilaterally Heart: regular rate and rhythm, S1, S2 normal, no murmur, click, rub or gallop Abdomen: soft, non-tender; bowel sounds normal; no masses,  no organomegaly Extremities: extremities normal, atraumatic, no cyanosis or edema  Lab Results:  [facility] 02/15/11 1207 02/15/11 0903  NA 166* 168*  K 3.9 3.9  CL >130* >130*  CO2 21 23  GLUCOSE 164* 115*  BUN 28* 27*  CREATININE 1.62* 1.57*  CALCIUM 9.7 10.0  MG -- --  PHOS -- --   No results found for this basename: AST:2,ALT:2,ALKPHOS:2,BILITOT:2,PROT:2,ALBUMIN:2 in the last 72 hours No results found for this basename: LIPASE:2,AMYLASE:2 in the last 72 hours  Basename 02/15/11 0450 02/14/11 1644  WBC 9.3 11.1*  NEUTROABS -- 8.2*  HGB 10.8* 12.1  HCT 37.0 41.7  MCV 82.8 82.4  PLT 237 268   No results found for this basename: CKTOTAL:3,CKMB:3,CKMBINDEX:3,TROPONINI:3 in the last 72 hours No components found with this basename: POCBNP:3 No results found for this basename: DDIMER:2 in the last 72 hours No results found for this basename: HGBA1C:2 in the last 72 hours No results found  for this basename: CHOL:2,HDL:2,LDLCALC:2,TRIG:2,CHOLHDL:2,LDLDIRECT:2 in the last 72 hours  Basename 02/14/11 2132  TSH 0.630  T4TOTAL --  T3FREE --  THYROIDAB --   No results found for this basename: VITAMINB12:2,FOLATE:2,FERRITIN:2,TIBC:2,IRON:2,RETICCTPCT:2 in the last 72 hours  Micro Results: Recent Results (from the past 240 hour(s))  MRSA PCR SCREENING     Status: Abnormal   Collection Time   02/15/11 12:04 AM      Component Value Range Status Comment   MRSA by PCR POSITIVE (*) NEGATIVE  Final     Studies/Results: No results found.  Medications: Scheduled Meds:    . sodium chloride   Intravenous Once  . cholecalciferol  1,000 Units Oral Daily  . desmopressin  2 mcg Subcutaneous BID  . diclofenac sodium  1 application Topical QID  . levothyroxine  125 mcg Oral QAC breakfast  . pantoprazole (PROTONIX) IV  40 mg Intravenous Q12H   Continuous Infusions:    . dextrose 100 mL/hr at 02/15/11 1300   PRN Meds:.acetaminophen, albuterol, ondansetron (ZOFRAN) IV, ondansetron  Assessment/Plan: Principal Problem:  *Hypernatremia- apparently she has a diagnosis of diabetes insipidus due to Lithium but she is still on Lithium.  However, her urine output is quite low and I doubt she actually has DI but suspect dehydration instead. I will cont to hydrate her with D5 - increase to 125cc/hr - and follow sodium levels closely. Will need to decrease sodium by 12 meq/ day.   Poor PO intake- is  likely the cause of above hypernatremia and dehydration. Will need to follow. Per RN she is eating about 50-75 % of her meals but needs to be fed by nursing staff   Encephalopathy, metabolic due to hypernatremia?   Essential hypertension, benign- Follow BP for now.    Depressive disorder, not elsewhere classified   Unspecified hypothyroidism  Due to sleepiness, I will hold off on resuming most of her sedating medications for now.  This includes: Seroquel, Lithium, Xanax, Zyprexa, Trazodone  and Cymbalta. I wonder if she is on these medications for dementia with behavioral disturbances (many of them are scheduled for the evening) and if she becomes agitated later today, we may have to resume some of them.   She apparently has been awake enough to take her meals and may awaken further as her dehydration and hypernatremia are corrected.     LOS: 1 day   [facility] [phone] 02/15/2011, 2:25 PM

## 2011-02-16 LAB — BASIC METABOLIC PANEL
BUN: 22 mg/dL (ref 6–23)
CO2: 19 mEq/L (ref 19–32)
CO2: 19 mEq/L (ref 19–32)
Calcium: 8.7 mg/dL (ref 8.4–10.5)
Calcium: 8.7 mg/dL (ref 8.4–10.5)
Calcium: 8.9 mg/dL (ref 8.4–10.5)
Calcium: 8.9 mg/dL (ref 8.4–10.5)
Chloride: 123 mEq/L — ABNORMAL HIGH (ref 96–112)
Chloride: 119 mEq/L — ABNORMAL HIGH (ref 96–112)
Chloride: 118 mEq/L — ABNORMAL HIGH (ref 96–112)
Creatinine, Ser: 1.55 mg/dL — ABNORMAL HIGH (ref 0.50–1.10)
Creatinine, Ser: 1.49 mg/dL — ABNORMAL HIGH (ref 0.50–1.10)
GFR calc Af Amer: 35 mL/min — ABNORMAL LOW (ref >90)
GFR calc Af Amer: 37 mL/min — ABNORMAL LOW (ref >90)
GFR calc Af Amer: 39 mL/min — ABNORMAL LOW (ref >90)
GFR calc Af Amer: 41 mL/min — ABNORMAL LOW (ref >90)
GFR calc Af Amer: 43 mL/min — ABNORMAL LOW (ref >90)
GFR calc non Af Amer: 30 mL/min — ABNORMAL LOW (ref >90)
GFR calc non Af Amer: 32 mL/min — ABNORMAL LOW (ref >90)
GFR calc non Af Amer: 35 mL/min — ABNORMAL LOW (ref >90)
GFR calc non Af Amer: 37 mL/min — ABNORMAL LOW (ref >90)
Glucose, Bld: 190 mg/dL — ABNORMAL HIGH (ref 70–99)
Potassium: 3.1 mEq/L — ABNORMAL LOW (ref 3.5–5.1)
Potassium: 3.4 mEq/L — ABNORMAL LOW (ref 3.5–5.1)
Potassium: 3.8 mEq/L (ref 3.5–5.1)
Potassium: 4 mEq/L (ref 3.5–5.1)
Sodium: 147 mEq/L — ABNORMAL HIGH (ref 135–145)
Sodium: 148 mEq/L — ABNORMAL HIGH (ref 135–145)
Sodium: 151 mEq/L — ABNORMAL HIGH (ref 135–145)
Sodium: 154 mEq/L — ABNORMAL HIGH (ref 135–145)
Sodium: 155 mEq/L — ABNORMAL HIGH (ref 135–145)

## 2011-02-16 MED ORDER — OLANZAPINE 10 MG PO TABS
20.0000 mg | ORAL_TABLET | Freq: Every day | ORAL | Status: DC
  Administered 2011-02-16 – 2011-02-21 (×5): 20 mg via ORAL
  Filled 2011-02-16 (×7): qty 2

## 2011-02-16 MED ORDER — POLYETHYLENE GLYCOL 3350 17 G PO PACK
17.0000 g | PACK | Freq: Every day | ORAL | Status: DC
  Administered 2011-02-16 – 2011-02-24 (×7): 17 g via ORAL
  Filled 2011-02-16 (×9): qty 1

## 2011-02-16 MED ORDER — SENNOSIDES-DOCUSATE SODIUM 8.6-50 MG PO TABS
2.0000 | ORAL_TABLET | Freq: Every day | ORAL | Status: DC
  Administered 2011-02-16 – 2011-02-23 (×7): 2 via ORAL
  Filled 2011-02-16 (×9): qty 2

## 2011-02-16 MED ORDER — TRAZODONE HCL 50 MG PO TABS
50.0000 mg | ORAL_TABLET | Freq: Every day | ORAL | Status: DC
  Administered 2011-02-16 – 2011-02-23 (×7): 50 mg via ORAL
  Filled 2011-02-16 (×9): qty 1

## 2011-02-16 MED ORDER — SODIUM CHLORIDE 0.45 % IV SOLN
INTRAVENOUS | Status: DC
  Administered 2011-02-16 – 2011-02-17 (×3): via INTRAVENOUS

## 2011-02-16 NOTE — Progress Notes (Signed)
Attempted to call report twice; nurse busy; will try again soon.  Vss; patient resting comfortably.   Van Clines 8:47 PM 02/16/2011

## 2011-02-16 NOTE — Progress Notes (Signed)
[facility] [place] TEAM 8  Subjective: 77 year old with hx Lithium induced Diabetes insipidus who was transfered from her nursing home due to abnormal labs/an increased sodium level of 167.  The patient is resting comfortably in her hospital bed.  She is awake and alert.  She is however quite confused.  She is not agitated.  She is pleasant in conversation.  She denies fevers chills nausea vomiting shortness of breath or chest pain-but admittedly her history is of questionable validity due to dementia.  Objective: Weight change:   Intake/Output Summary (Last 24 hours) at 02/16/11 1510 Last data filed at 02/16/11 1400  Gross per 24 hour  Intake   1395 ml  Output   2425 ml  Net  -1030 ml   Blood pressure 103/62, pulse 79, temperature 98.2 F (36.8 C), temperature source Oral, resp. rate 24, height 5\' 6"  (1.676 m), weight 59.1 kg (130 lb 4.7 oz), SpO2 100.00%.  Physical Exam: General: No acute respiratory distress Lungs: Clear to auscultation bilaterally without wheezes or crackles Cardiovascular: Regular rate and rhythm without murmur gallop or rub normal S1 and S2 Abdomen: Nontender, nondistended, soft, bowel sounds positive, no rebound, no ascites, no appreciable mass Extremities: No significant cyanosis, clubbing, or edema bilateral lower extremities GU:  Foley catheter is in place with a large volume of very dilute urine noted in the collecting system  Lab Results:  Basename 02/16/11 1340 02/16/11 1100 02/16/11 0900  NA 147* 148* 151*  K 3.3* 3.1* 3.8  CL 118* 119* 120*  CO2 19 18* 18*  GLUCOSE 134* 147* 128*  BUN 17 18 20   CREATININE 1.32* 1.36* 1.43*  CALCIUM 8.7 8.6 8.9  MG -- -- --  PHOS -- -- --    Basename 02/15/11 0450 02/14/11 1644  WBC 9.3 11.1*  NEUTROABS -- 8.2*  HGB 10.8* 12.1  HCT 37.0 41.7  MCV 82.8 82.4  PLT 237 268    Micro Results: Recent Results (from the past 240 hour(s))  MRSA PCR SCREENING     Status: Abnormal   Collection Time    02/15/11 12:04 AM      Component Value Range Status Comment   MRSA by PCR POSITIVE (*) NEGATIVE  Final     Studies/Results: All recent x-ray/radiology reports have been reviewed in detail.   Medications: I have reviewed the patient's complete medication list.  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 as one would see from chronic lithium use, as simple dehydration in an otherwise healthy individual should lead to severely concentrated urine, rather than dilute urine ( due to the action of ADH) -the fact that she has not been eating or drinking much lately prob explains why her probable chronic DI became exacerbated (she couldn't keep up with her renal loses)    Hypokalemia Replete and follow  Volume depletion/dehydration See discussion above  Altered mental status/dementia At the time of the patient's presentation she was markedly lethargic-with improvement in her sodium she has become interactive and conversant-she is confused but this is consistent with her baseline diagnosis of dementia  Hypertension Not an active problem at this time-follow with volume resuscitation  Depression  Hypothyroidism TSH is in target range  Acute renal failure Likely prerenal azotemia due to  decreased by mouth intake and high urinary fluid loss due to DI - creatinine much improved  Disposition The patient is stable for transfer to telemetry bed-once her sodium level has normalized and proved to be stable she will be a candidate to return to her skilled nursing facility  Lonia Blood, MD [facility] Office  [phone] Pager [phone]  On-Call/Text Page:      [email or web address]      password [facility]

## 2011-02-16 NOTE — Progress Notes (Signed)
Patient tx. To 6705.  Nurse in room with patient; vss.  Van Clines 9:49 PM 02/16/2011

## 2011-02-17 LAB — CBC
HCT: 30.7 % — ABNORMAL LOW (ref 36.0–46.0)
Hemoglobin: 9.1 g/dL — ABNORMAL LOW (ref 12.0–15.0)
MCH: 23.9 pg — ABNORMAL LOW (ref 26.0–34.0)
MCHC: 29.6 g/dL — ABNORMAL LOW (ref 30.0–36.0)
MCV: 80.6 fL (ref 78.0–100.0)
Platelets: 180 10*3/uL (ref 150–400)
RBC: 3.81 MIL/uL — ABNORMAL LOW (ref 3.87–5.11)
RDW: 18.2 % — ABNORMAL HIGH (ref 11.5–15.5)
WBC: 7.9 10*3/uL (ref 4.0–10.5)

## 2011-02-17 LAB — BASIC METABOLIC PANEL
GFR calc non Af Amer: 48 mL/min — ABNORMAL LOW (ref >90)
Glucose, Bld: 89 mg/dL (ref 70–99)
Potassium: 3.6 mEq/L (ref 3.5–5.1)
Sodium: 154 mEq/L — ABNORMAL HIGH (ref 135–145)

## 2011-02-17 NOTE — Progress Notes (Addendum)
Subjective: Patient alert and oriented to self however very restless wanting to get up and walk. Patient denies any complaints at this time. Objective: Filed Vitals:   02/16/11 2145 02/17/11 0523 02/17/11 0604 02/17/11 1300  BP: 101/69 103/70  97/63  Pulse: 77 78  87  Temp: 97.5 F (36.4 C) 97.8 F (36.6 C)  97.9 F (36.6 C)  TempSrc: Oral Oral  Oral  Resp: 18 18  19   Height:      Weight:   60.9 kg (134 lb 4.2 oz)   SpO2: 97% 98%  97%   Weight change:   Intake/Output Summary (Last 24 hours) at 02/17/11 1642 Last data filed at 02/17/11 1300  Gross per 24 hour  Intake 2674.5 ml  Output   5400 ml  Net -2725.5 ml    General: Alert, awake, oriented x3, in no acute distress.  OROPHARYNX:  Moist, No exudate/ erythema/lesions.  Heart: Regular rate and rhythm, without murmurs, rubs, gallops, PMI non-displaced, no heaves or thrills on palpation.  Lungs: Clear to auscultation, no wheezing or rhonchi noted. No increased vocal fremitus resonant to percussion  Abdomen: Soft, nontender, nondistended, positive bowel sounds, no masses no hepatosplenomegaly noted.Marland Kitchen  Psychiatric: Patient alert and oriented to person and place. Lymph node survey: No cervical axillary or inguinal lymphadenopathy noted.     Lab Results:  Basename 02/17/11 0730 02/16/11 1340  NA 154* 147*  K 3.6 3.3*  CL 124* 118*  CO2 21 19  GLUCOSE 89 134*  BUN 15 17  CREATININE 1.10 1.32*  CALCIUM 9.0 8.7  MG -- --  PHOS -- --   No results found for this basename: AST:2,ALT:2,ALKPHOS:2,BILITOT:2,PROT:2,ALBUMIN:2 in the last 72 hours No results found for this basename: LIPASE:2,AMYLASE:2 in the last 72 hours  Basename 02/17/11 0730 02/15/11 0450 02/14/11 1644  WBC 7.9 9.3 --  NEUTROABS -- -- 8.2*  HGB 9.1* 10.8* --  HCT 30.7* 37.0 --  MCV 80.6 82.8 --  PLT 180 237 --   No results found for this basename: CKTOTAL:3,CKMB:3,CKMBINDEX:3,TROPONINI:3 in the last 72 hours No components found with this basename:  POCBNP:3 No results found for this basename: DDIMER:2 in the last 72 hours No results found for this basename: HGBA1C:2 in the last 72 hours No results found for this basename: CHOL:2,HDL:2,LDLCALC:2,TRIG:2,CHOLHDL:2,LDLDIRECT:2 in the last 72 hours  Basename 02/14/11 2132  TSH 0.630  T4TOTAL --  T3FREE --  THYROIDAB --   No results found for this basename: VITAMINB12:2,FOLATE:2,FERRITIN:2,TIBC:2,IRON:2,RETICCTPCT:2 in the last 72 hours  Micro Results: Recent Results (from the past 240 hour(s))  MRSA PCR SCREENING     Status: Abnormal   Collection Time   02/15/11 12:04 AM      Component Value Range Status Comment   MRSA by PCR POSITIVE (*) NEGATIVE  Final     Studies/Results: No results found.  Medications: I have reviewed the patient's current medications. Scheduled Meds:   . aspirin EC  81 mg Oral Daily  . cholecalciferol  1,000 Units Oral Daily  . diclofenac sodium  1 application Topical QID  . levothyroxine  125 mcg Oral QAC breakfast  . megestrol  800 mg Oral Daily  . OLANZapine  20 mg Oral QHS  . pantoprazole  20 mg Oral Q1200  . polyethylene glycol  17 g Oral Daily  . senna-docusate  2 tablet Oral QHS  . simvastatin  20 mg Oral Daily  . traZODone  50 mg Oral QHS   Continuous Infusions:   . 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  Speak with nephrology.   Essential hypertension, benign (02/01/2011)   Assessment: Blood pressures are on the low side likely secondary to increase fluid output    Unspecified hypothyroidism (02/01/2011)   Assessment: TSH within normal limits   Encephalopathy  (02/14/2011)   Assessment: Pt is fully alert and interactive which is likely her baseline  Acute Renal Failure   Assessment: Seems to be at baseline   Hypokalemia   Assessment: repleted  LOS: 3 days

## 2011-02-18 LAB — BASIC METABOLIC PANEL
CO2: 20 mEq/L (ref 19–32)
Calcium: 9.3 mg/dL (ref 8.4–10.5)
Chloride: 127 mEq/L — ABNORMAL HIGH (ref 96–112)
Glucose, Bld: 92 mg/dL (ref 70–99)
Potassium: 3.6 mEq/L (ref 3.5–5.1)
Sodium: 158 mEq/L — ABNORMAL HIGH (ref 135–145)

## 2011-02-18 LAB — SODIUM, URINE, RANDOM: Sodium, Ur: 58 mEq/L

## 2011-02-18 LAB — CREATININE, URINE, RANDOM: Creatinine, Urine: 15.03 mg/dL

## 2011-02-18 LAB — OSMOLALITY, URINE: Osmolality, Ur: 190 mOsm/kg — ABNORMAL LOW (ref 390–1090)

## 2011-02-18 MED ORDER — DEXTROSE 5 % IV SOLN
INTRAVENOUS | Status: DC
  Administered 2011-02-18 – 2011-02-21 (×9): via INTRAVENOUS

## 2011-02-18 MED ORDER — CHLORHEXIDINE GLUCONATE CLOTH 2 % EX PADS
6.0000 | MEDICATED_PAD | Freq: Every day | CUTANEOUS | Status: DC
  Administered 2011-02-18 – 2011-02-19 (×2): 6 via TOPICAL

## 2011-02-18 MED ORDER — MUPIROCIN 2 % EX OINT
1.0000 "application " | TOPICAL_OINTMENT | Freq: Two times a day (BID) | CUTANEOUS | Status: AC
  Administered 2011-02-18 – 2011-02-22 (×10): 1 via NASAL
  Filled 2011-02-18: qty 22

## 2011-02-18 NOTE — Progress Notes (Signed)
Subjective: Patient alert and oriented to self and less confused today P. Patient denies any complaints at this time. Objective: Filed Vitals:   02/17/11 2038 02/18/11 0303 02/18/11 0538 02/18/11 0900  BP:  126/68 110/96 120/77  Pulse:  86 77 88  Temp:  99 F (37.2 C) 98.4 F (36.9 C) 99.8 F (37.7 C)  TempSrc:  Oral Oral Oral  Resp:  18 18 19   Height:      Weight: 60.4 kg (133 lb 2.5 oz)     SpO2:  98% 97% 98%   Weight change: -0.5 kg (-1 lb 1.6 oz)  Intake/Output Summary (Last 24 hours) at 02/18/11 0932 Last data filed at 02/18/11 0900  Gross per 24 hour  Intake   3360 ml  Output   5075 ml  Net  -1715 ml    General: Alert, awake, oriented x3, in no acute distress.  OROPHARYNX:  Moist, No exudate/ erythema/lesions.  Heart: Regular rate and rhythm, without murmurs, rubs, gallops, PMI non-displaced, no heaves or thrills on palpation.  Lungs: Clear to auscultation, no wheezing or rhonchi noted. No increased vocal fremitus resonant to percussion  Abdomen: Soft, nontender, nondistended, positive bowel sounds, no masses no hepatosplenomegaly noted.Marland Kitchen  Psychiatric: Patient alert and oriented to person and place. Lymph node survey: No cervical axillary or inguinal lymphadenopathy noted.     Lab Results:  Basename 02/18/11 0500 02/17/11 0730  NA 158* 154*  K 3.6 3.6  CL 127* 124*  CO2 20 21  GLUCOSE 92 89  BUN 18 15  CREATININE 1.12* 1.10  CALCIUM 9.3 9.0  MG -- --  PHOS -- --   No results found for this basename: AST:2,ALT:2,ALKPHOS:2,BILITOT:2,PROT:2,ALBUMIN:2 in the last 72 hours No results found for this basename: LIPASE:2,AMYLASE:2 in the last 72 hours  Basename 02/17/11 0730  WBC 7.9  NEUTROABS --  HGB 9.1*  HCT 30.7*  MCV 80.6  PLT 180   No results found for this basename: CKTOTAL:3,CKMB:3,CKMBINDEX:3,TROPONINI:3 in the last 72 hours No components found with this basename: POCBNP:3 No results found for this basename: DDIMER:2 in the last 72 hours No  results found for this basename: HGBA1C:2 in the last 72 hours No results found for this basename: CHOL:2,HDL:2,LDLCALC:2,TRIG:2,CHOLHDL:2,LDLDIRECT:2 in the last 72 hours No results found for this basename: TSH,T4TOTAL,FREET3,T3FREE,THYROIDAB in the last 72 hours No results found for this basename: VITAMINB12:2,FOLATE:2,FERRITIN:2,TIBC:2,IRON:2,RETICCTPCT:2 in the last 72 hours  Micro Results: Recent Results (from the past 240 hour(s))  MRSA PCR SCREENING     Status: Abnormal   Collection Time   02/15/11 12:04 AM      Component Value Range Status Comment   MRSA by PCR POSITIVE (*) NEGATIVE  Final     Studies/Results: No results found.  Medications: I have reviewed the patient's current medications. Scheduled Meds:    . aspirin EC  81 mg Oral Daily  . Chlorhexidine Gluconate Cloth  6 each Topical Q0600  . cholecalciferol  1,000 Units Oral Daily  . diclofenac sodium  1 application Topical QID  . levothyroxine  125 mcg Oral QAC breakfast  . megestrol  800 mg Oral Daily  . mupirocin ointment  1 application Nasal BID  . OLANZapine  20 mg Oral QHS  . pantoprazole  20 mg Oral Q1200  . polyethylene glycol  17 g Oral Daily  . senna-docusate  2 tablet Oral QHS  . simvastatin  20 mg Oral Daily  . traZODone  50 mg Oral QHS   Continuous Infusions:    . dextrose    .  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 I will change IV fluids to D5W. I discussed with Dr. Caryn Section in nephrology who agrees with management. There is no improvement in the sodium is after 24 hours of change in IV fluids nephrology was to the patient   Essential hypertension, benign (02/01/2011)   Assessment: Blood pressures are  on the low side likely secondary to increase fluid output    Unspecified hypothyroidism (02/01/2011)   Assessment: TSH within normal limits   Encephalopathy (02/14/2011)   Assessment: Pt is fully alert and interactive which is likely her baseline  Acute Renal Failure   Assessment: Seems to be at baseline   Hypokalemia   Assessment: repleted  LOS: 4 days

## 2011-02-19 LAB — BASIC METABOLIC PANEL
BUN: 50 mg/dL — ABNORMAL HIGH (ref 6–23)
Calcium: 8.3 mg/dL — ABNORMAL LOW (ref 8.4–10.5)
Creatinine, Ser: 2.84 mg/dL — ABNORMAL HIGH (ref 0.50–1.10)
GFR calc Af Amer: 18 mL/min — ABNORMAL LOW (ref >90)
GFR calc non Af Amer: 15 mL/min — ABNORMAL LOW (ref >90)
Glucose, Bld: 192 mg/dL — ABNORMAL HIGH (ref 70–99)

## 2011-02-19 LAB — CREATININE, URINE, RANDOM: Creatinine, Urine: 21.88 mg/dL

## 2011-02-19 LAB — SODIUM, URINE, RANDOM: Sodium, Ur: 37 mEq/L

## 2011-02-19 MED ORDER — WHITE PETROLATUM GEL
Status: AC
  Filled 2011-02-19: qty 5

## 2011-02-19 NOTE — Progress Notes (Signed)
Subjective: Patient sitting a hallway with staff and very pleasant and happy and interactive. She denies any complaints at this time. Urine output continues to be vigorous Objective: Filed Vitals:   02/18/11 2050 02/19/11 0505 02/19/11 0924 02/19/11 1806  BP: 115/75 130/82 124/87 133/66  Pulse: 75 76 83 83  Temp: 98.6 F (37 C) 98.1 F (36.7 C) 98.8 F (37.1 C) 98.4 F (36.9 C)  TempSrc: Oral Oral Oral Oral  Resp: 18 18 18 18   Height:      Weight: 60.7 kg (133 lb 13.1 oz)     SpO2: 96% 96% 100% 100%   Weight change: -0.9 kg (-1 lb 15.8 oz)  Intake/Output Summary (Last 24 hours) at 02/19/11 1912 Last data filed at 02/19/11 1811  Gross per 24 hour  Intake   2700 ml  Output   3400 ml  Net   -700 ml    General: Alert, awake, oriented x3, in no acute distress.  OROPHARYNX:  Moist, No exudate/ erythema/lesions.  Heart: Regular rate and rhythm, without murmurs, rubs, gallops, PMI non-displaced, no heaves or thrills on palpation.  Lungs: Clear to auscultation, no wheezing or rhonchi noted. No increased vocal fremitus resonant to percussion  Abdomen: Soft, nontender, nondistended, positive bowel sounds, no masses no hepatosplenomegaly noted.Marland Kitchen  Psychiatric: Patient alert and oriented to person and place. Lymph node survey: No cervical axillary or inguinal lymphadenopathy noted. Skin: Skin turgor appears better today     Lab Results:  Basename 02/19/11 1140 02/18/11 0500  NA 137 158*  K 3.8 3.6  CL 105 127*  CO2 18* 20  GLUCOSE 192* 92  BUN 50* 18  CREATININE 2.84* 1.12*  CALCIUM 8.3* 9.3  MG -- --  PHOS -- --   No results found for this basename: AST:2,ALT:2,ALKPHOS:2,BILITOT:2,PROT:2,ALBUMIN:2 in the last 72 hours No results found for this basename: LIPASE:2,AMYLASE:2 in the last 72 hours  Basename 02/17/11 0730  WBC 7.9  NEUTROABS --  HGB 9.1*  HCT 30.7*  MCV 80.6  PLT 180   No results found for this basename: CKTOTAL:3,CKMB:3,CKMBINDEX:3,TROPONINI:3 in the  last 72 hours No components found with this basename: POCBNP:3 No results found for this basename: DDIMER:2 in the last 72 hours No results found for this basename: HGBA1C:2 in the last 72 hours No results found for this basename: CHOL:2,HDL:2,LDLCALC:2,TRIG:2,CHOLHDL:2,LDLDIRECT:2 in the last 72 hours No results found for this basename: TSH,T4TOTAL,FREET3,T3FREE,THYROIDAB in the last 72 hours No results found for this basename: VITAMINB12:2,FOLATE:2,FERRITIN:2,TIBC:2,IRON:2,RETICCTPCT:2 in the last 72 hours  Micro Results: Recent Results (from the past 240 hour(s))  MRSA PCR SCREENING     Status: Abnormal   Collection Time   02/15/11 12:04 AM      Component Value Range Status Comment   MRSA by PCR POSITIVE (*) NEGATIVE  Final     Studies/Results: No results found.  Medications: I have reviewed the patient's current medications. Scheduled Meds:    . aspirin EC  81 mg Oral Daily  . Chlorhexidine Gluconate Cloth  6 each Topical Q0600  . cholecalciferol  1,000 Units Oral Daily  . levothyroxine  125 mcg Oral QAC breakfast  . megestrol  800 mg Oral Daily  . mupirocin ointment  1 application Nasal BID  . OLANZapine  20 mg Oral QHS  . pantoprazole  20 mg Oral Q1200  . polyethylene glycol  17 g Oral Daily  . senna-docusate  2 tablet Oral QHS  . simvastatin  20 mg Oral Daily  . traZODone  50 mg Oral QHS  .  white petrolatum      . DISCONTD: diclofenac sodium  1 application Topical QID   Continuous Infusions:    . 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 I will change IV fluids to D5W. I will increase the patient's IV fluids as she is in a negative balance. I've also asked Dr. Casimiro Needle to see the  patient in consultation and give recommendations.   Essential hypertension, benign (02/01/2011)   Assessment: Blood pressures improved.   Unspecified hypothyroidism (02/01/2011)   Assessment: TSH within normal limits   Encephalopathy (02/14/2011)   Assessment: Pt is fully alert and interactive which is likely her baseline  Acute Renal Failure   Assessment: Renal function worse today likely secondary to prerenal state given the increase in BUN. We'll increase IV fluids and recheck in the a.m.    Hypokalemia   Assessment: repleted  LOS: 5 days

## 2011-02-19 NOTE — Progress Notes (Signed)
Utilization Review Completed.[name] T2/11/2011   

## 2011-02-19 NOTE — Consults (Signed)
Assessment:  1. 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 Acute Kidney Injury, probably hemodynamically mediated Plan: 1 Increase hydration 2 Hold topical diclofenac for now, but doubt problematic   HPI:  I was asked by Dr. Ashley Royalty to see [name] who is a 77 y.o. female admitted with hypernatremia to 186me/l due to lithium therapy and altered mental staus.  Pt treated with cessation of Lithium and IV D5W.  Patient had correction of sodium to 137 meq/l today, but increased creatinine to 2.84 today.  She is negative 5.6 liters. Creatinine was 0.9 on Sept 11, 2012.  Past Medical History  Diagnosis Date  . Thyroid cancer   . Ulcer     chronic  . Depression   . Vitamin d deficiency    No past surgical history on file. Social History:  reports that she has quit smoking. Her smoking use included Cigarettes. She does not have any smokeless tobacco history on file. She reports that she does not drink alcohol. Her drug history not on file. Allergies:  Allergies  Allergen Reactions  . Penicillins   . Sulfa Antibiotics    No family history on file.  She is currently confused  Medications: reviewed   ROS: not contributory Blood pressure 124/87, pulse 83, temperature 98.8 F (37.1 C), temperature source Oral, resp. rate 18, height 5\' 6"  (1.676 m), weight 60.7 kg (133 lb 13.1 oz), SpO2 100.00%.  General appearance: alert and confused Head: ATNC Resp: clear to auscultation bilaterally Chest wall: no tenderness Cardio: regular rate and rhythm, S1, S2 normal, no murmur, click, rub or gallop GI: soft, non-tender; bowel sounds normal; no masses,  no organomegaly Extremities: no edema, redness or tenderness in the calves or thighs Skin: turgor diminished, no edema Neurologic: Grossly normal, but confused Results for orders placed during the hospital encounter of 02/14/11 (from the past 48 hour(s))  BASIC METABOLIC PANEL     Status: Abnormal   Collection Time   02/18/11  5:00 AM      Component Value Range Comment   Sodium 158 (*) 135 - 145 (mEq/L)    Potassium 3.6  3.5 - 5.1 (mEq/L)    Chloride 127 (*) 96 - 112 (mEq/L)    CO2 20  19 - 32 (mEq/L)    Glucose, Bld 92  70 - 99 (mg/dL)    BUN 18  6 - 23 (mg/dL)    Creatinine, Ser 1.61 (*) 0.50 - 1.10 (mg/dL)    Calcium 9.3  8.4 - 10.5 (mg/dL)    GFR calc non Af Amer 47 (*) >90 (mL/min)    GFR calc Af Amer 55 (*) >90 (mL/min)   CREATININE, URINE, RANDOM     Status: Normal   Collection Time   02/18/11  5:00 AM      Component Value Range Comment   Creatinine, Urine 15.03     SODIUM, URINE, RANDOM     Status: Normal   Collection Time   02/18/11  5:00 AM      Component Value Range Comment   Sodium, Ur 58     OSMOLALITY, URINE     Status: Abnormal   Collection Time   02/18/11  5:00 AM      Component Value Range Comment   Osmolality, Ur 190 (*) 390 - 1090 (mOsm/kg)   OSMOLALITY, URINE     Status: Abnormal   Collection Time   02/19/11  5:08 AM      Component Value Range Comment  Osmolality, Ur 161 (*) 390 - 1090 (mOsm/kg)   SODIUM, URINE, RANDOM     Status: Normal   Collection Time   02/19/11  5:08 AM      Component Value Range Comment   Sodium, Ur 37     CREATININE, URINE, RANDOM     Status: Normal   Collection Time   02/19/11  5:08 AM      Component Value Range Comment   Creatinine, Urine 21.88     GLUCOSE, CAPILLARY     Status: Normal   Collection Time   02/19/11  7:31 AM      Component Value Range Comment   Glucose-Capillary 85  70 - 99 (mg/dL)   BASIC METABOLIC PANEL     Status: Abnormal   Collection Time   02/19/11 11:40 AM      Component Value Range Comment   Sodium 137  135 - 145 (mEq/L)    Potassium 3.8  3.5 - 5.1 (mEq/L)    Chloride 105  96 - 112 (mEq/L)    CO2 18 (*) 19 - 32 (mEq/L)    Glucose, Bld 192 (*) 70 - 99 (mg/dL)    BUN 50 (*) 6 - 23 (mg/dL) DELTA CHECK NOTED   Creatinine, Ser 2.84 (*) 0.50 - 1.10 (mg/dL) DELTA CHECK NOTED   Calcium 8.3 (*) 8.4 -  10.5 (mg/dL)    GFR calc non Af Amer 15 (*) >90 (mL/min)    GFR calc Af Amer 18 (*) >90 (mL/min)    No results found.   Tysheem Accardo C 02/19/2011, 3:23 PM

## 2011-02-20 ENCOUNTER — Ambulatory Visit (HOSPITAL_COMMUNITY): Payer: Medicare Other

## 2011-02-20 LAB — BASIC METABOLIC PANEL
BUN: 17 mg/dL (ref 6–23)
Calcium: 8.9 mg/dL (ref 8.4–10.5)
Creatinine, Ser: 1.06 mg/dL (ref 0.50–1.10)
GFR calc Af Amer: 58 mL/min — ABNORMAL LOW (ref >90)

## 2011-02-20 LAB — GLUCOSE, CAPILLARY
Glucose-Capillary: 101 mg/dL — ABNORMAL HIGH (ref 70–99)
Glucose-Capillary: 145 mg/dL — ABNORMAL HIGH (ref 70–99)

## 2011-02-20 MED ORDER — CHLORHEXIDINE GLUCONATE CLOTH 2 % EX PADS
6.0000 | MEDICATED_PAD | Freq: Every day | CUTANEOUS | Status: AC
  Administered 2011-02-20 – 2011-02-21 (×2): 6 via TOPICAL

## 2011-02-20 MED ORDER — LORAZEPAM 0.5 MG PO TABS
0.5000 mg | ORAL_TABLET | Freq: Once | ORAL | Status: AC
  Administered 2011-02-20: 0.5 mg via ORAL
  Filled 2011-02-20 (×2): qty 1

## 2011-02-20 NOTE — Progress Notes (Signed)
Subjective: Patient sitting a hallway with staff and very pleasant and happy and interactive. She denies any complaints at this time. Urine output continues to be vigorous Objective: Filed Vitals:   02/20/11 0543 02/20/11 1024 02/20/11 1300 02/20/11 1648  BP: 109/76 144/88 142/89 132/74  Pulse: 90 88 69 77  Temp: 98.2 F (36.8 C) 98.4 F (36.9 C) 98.1 F (36.7 C) 97.9 F (36.6 C)  TempSrc: Oral Oral Oral Oral  Resp: 18 18 22 20   Height:      Weight:      SpO2: 99% 99% 94% 97%   Weight change: 1.4 kg (3 lb 1.4 oz)  Intake/Output Summary (Last 24 hours) at 02/20/11 2052 Last data filed at 02/20/11 1900  Gross per 24 hour  Intake   4440 ml  Output   4725 ml  Net   -285 ml    General: Alert, awake, oriented x3, in no acute distress.  OROPHARYNX:  Moist, No exudate/ erythema/lesions.  Heart: Regular rate and rhythm, without murmurs, rubs, gallops, PMI non-displaced, no heaves or thrills on palpation.  Lungs: Clear to auscultation, no wheezing or rhonchi noted. No increased vocal fremitus resonant to percussion  Abdomen: Soft, nontender, nondistended, positive bowel sounds, no masses no hepatosplenomegaly noted.Marland Kitchen  Psychiatric: Patient alert and oriented to person and place. Lymph node survey: No cervical axillary or inguinal lymphadenopathy noted. Skin: Skin turgor appears better today     Lab Results:  Basename 02/20/11 0512 02/19/11 1140  NA 149* 137  K 3.7 3.8  CL 117* 105  CO2 19 18*  GLUCOSE 138* 192*  BUN 17 50*  CREATININE 1.06 2.84*  CALCIUM 8.9 8.3*  MG -- --  PHOS -- --   No results found for this basename: AST:2,ALT:2,ALKPHOS:2,BILITOT:2,PROT:2,ALBUMIN:2 in the last 72 hours No results found for this basename: LIPASE:2,AMYLASE:2 in the last 72 hours No results found for this basename: WBC:2,NEUTROABS:2,HGB:2,HCT:2,MCV:2,PLT:2 in the last 72 hours No results found for this basename: CKTOTAL:3,CKMB:3,CKMBINDEX:3,TROPONINI:3 in the last 72 hours No  components found with this basename: POCBNP:3 No results found for this basename: DDIMER:2 in the last 72 hours No results found for this basename: HGBA1C:2 in the last 72 hours No results found for this basename: CHOL:2,HDL:2,LDLCALC:2,TRIG:2,CHOLHDL:2,LDLDIRECT:2 in the last 72 hours No results found for this basename: TSH,T4TOTAL,FREET3,T3FREE,THYROIDAB in the last 72 hours No results found for this basename: VITAMINB12:2,FOLATE:2,FERRITIN:2,TIBC:2,IRON:2,RETICCTPCT:2 in the last 72 hours  Micro Results: Recent Results (from the past 240 hour(s))  MRSA PCR SCREENING     Status: Abnormal   Collection Time   02/15/11 12:04 AM      Component Value Range Status Comment   MRSA by PCR POSITIVE (*) NEGATIVE  Final     Studies/Results: No results found.  Medications: I have reviewed the patient's current medications. Scheduled Meds:    . aspirin EC  81 mg Oral Daily  . Chlorhexidine Gluconate Cloth  6 each Topical Q0600  . cholecalciferol  1,000 Units Oral Daily  . levothyroxine  125 mcg Oral QAC breakfast  . LORazepam  0.5 mg Oral Once  . megestrol  800 mg Oral Daily  . mupirocin ointment  1 application Nasal BID  . OLANZapine  20 mg Oral QHS  . pantoprazole  20 mg Oral Q1200  . polyethylene glycol  17 g Oral Daily  . senna-docusate  2 tablet Oral QHS  . simvastatin  20 mg Oral Daily  . traZODone  50 mg Oral QHS  . DISCONTD: Chlorhexidine Gluconate Cloth  6 each  Topical Q0600   Continuous Infusions:    . dextrose 200 mL/hr at 02/20/11 1900   PRN Meds:.acetaminophen, albuterol, ondansetron (ZOFRAN) IV, ondansetron Assessment/Plan: Patient Active Hospital Problem List: Hypernatremia (02/14/2011)   Assessment:Nephrogenic diabetes insipidus associated with chronic lithium use. Electrolytes are improved with increased IVF.  Essential hypertension, benign (02/01/2011)   Assessment: Blood pressures improved.   Unspecified hypothyroidism (02/01/2011)   Assessment: TSH within normal  limits   Encephalopathy (02/14/2011)   Assessment: Pt is fully alert and interactive which is likely her baseline  Acute Renal Failure   Assessment: Renal function improving.  Hypokalemia   Assessment: Repleted  Bipolar   Assessment: Pt off Lithium and has been doing okay up until today when she has began to exhibit signs of depressive affect. Will ask Psychiatry to see in consult for recommendations.  LOS: 6 days

## 2011-02-20 NOTE — Progress Notes (Addendum)
Pt has been very lethargic since the beginning of my shift. Pt is easily aroused; VS are stable, yet pt can not carry on a conversation with out falling asleep. Pt received Ativan last night. Consulted with Council Mechanic, RN of Rapid Response Team. MD made aware. MD to come see pt. [place], Rosanna Randy

## 2011-02-20 NOTE — Progress Notes (Signed)
Assessment:  1. 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 Acute Kidney Injury, probably hemodynamically mediated or could have been lab error Plan:  1 Continue hydration, increase while not drinking.  May need to add thiazide 2 May benefit from psychiatry consult to manage psych meds, as would keep off lithium  S:not very communicative today, not eating or drinking O:BP 144/88  Pulse 88  Temp(Src) 98.4 F (36.9 C) (Oral)  Resp 18  Ht 5\' 6"  (1.676 m)  Wt 60.9 kg (134 lb 4.2 oz)  BMI 21.67 kg/m2  SpO2 99%  Intake/Output Summary (Last 24 hours) at 02/20/11 1100 Last data filed at 02/20/11 0900  Gross per 24 hour  Intake   4895 ml  Output   4925 ml  Net    -30 ml   Weight change: 1.4 kg (3 lb 1.4 oz) Gen dry mouth, looks and acts confused CVS:RRR Resp:clear ZOX:WRUE Ext:no edema with decreased skin turgor    . aspirin EC  81 mg Oral Daily  . Chlorhexidine Gluconate Cloth  6 each Topical Q0600  . cholecalciferol  1,000 Units Oral Daily  . levothyroxine  125 mcg Oral QAC breakfast  . LORazepam  0.5 mg Oral Once  . megestrol  800 mg Oral Daily  . mupirocin ointment  1 application Nasal BID  . OLANZapine  20 mg Oral QHS  . pantoprazole  20 mg Oral Q1200  . polyethylene glycol  17 g Oral Daily  . senna-docusate  2 tablet Oral QHS  . simvastatin  20 mg Oral Daily  . traZODone  50 mg Oral QHS  . white petrolatum      . DISCONTD: Chlorhexidine Gluconate Cloth  6 each Topical Q0600  . DISCONTD: diclofenac sodium  1 application Topical QID   No results found. BMET  Lab 02/20/11 0512 02/19/11 1140 02/18/11 0500 02/17/11 0730 02/16/11 1340 02/16/11 1100 02/16/11 0900  NA 149* 137 158* 154* 147* 148* 151*  K 3.7 3.8 3.6 3.6 3.3* 3.1* 3.8  CL 117* 105 127* 124* 118* 119* 120*  CO2 19 18* 20 21 19  18* 18*  GLUCOSE 138* 192* 92 89 134* 147* 128*  BUN 17 50* 18 15 17 18 20   CREATININE 1.06 2.84* 1.12* 1.10 1.32* 1.36* 1.43*  ALB -- -- -- --  -- -- --  CALCIUM 8.9 8.3* 9.3 9.0 8.7 8.6 8.9  PHOS -- -- -- -- -- -- --   CBC  Lab 02/17/11 0730 02/15/11 0450 02/14/11 1644  WBC 7.9 9.3 11.1*  NEUTROABS -- -- 8.2*  HGB 9.1* 10.8* 12.1  HCT 30.7* 37.0 41.7  MCV 80.6 82.8 82.4  PLT 180 237 268   [name] C

## 2011-02-21 LAB — GLUCOSE, CAPILLARY
Glucose-Capillary: 109 mg/dL — ABNORMAL HIGH (ref 70–99)
Glucose-Capillary: 188 mg/dL — ABNORMAL HIGH (ref 70–99)
Glucose-Capillary: 123 mg/dL — ABNORMAL HIGH (ref 70–99)

## 2011-02-21 LAB — RENAL FUNCTION PANEL
BUN: 13 mg/dL (ref 6–23)
CO2: 18 mEq/L — ABNORMAL LOW (ref 19–32)
Calcium: 8.7 mg/dL (ref 8.4–10.5)
Chloride: 117 mEq/L — ABNORMAL HIGH (ref 96–112)
Creatinine, Ser: 1 mg/dL (ref 0.50–1.10)
Glucose, Bld: 135 mg/dL — ABNORMAL HIGH (ref 70–99)

## 2011-02-21 MED ORDER — AMILORIDE HCL 5 MG PO TABS
5.0000 mg | ORAL_TABLET | Freq: Every day | ORAL | Status: DC
  Administered 2011-02-21 – 2011-02-24 (×3): 5 mg via ORAL
  Filled 2011-02-21 (×4): qty 1

## 2011-02-21 MED ORDER — POTASSIUM CHLORIDE 2 MEQ/ML IV SOLN
INTRAVENOUS | Status: AC
  Administered 2011-02-21 – 2011-02-22 (×2): via INTRAVENOUS
  Filled 2011-02-21 (×4): qty 1000

## 2011-02-21 MED ORDER — DEXTROSE 5 % IV SOLN
INTRAVENOUS | Status: DC
  Administered 2011-02-21 – 2011-02-23 (×2): via INTRAVENOUS

## 2011-02-21 MED ORDER — HYDROCHLOROTHIAZIDE 12.5 MG PO CAPS
25.0000 mg | ORAL_CAPSULE | Freq: Every day | ORAL | Status: DC
  Administered 2011-02-21 – 2011-02-24 (×3): 25 mg via ORAL
  Filled 2011-02-21 (×4): qty 2

## 2011-02-21 MED ORDER — POTASSIUM CHLORIDE CRYS ER 20 MEQ PO TBCR
40.0000 meq | EXTENDED_RELEASE_TABLET | Freq: Once | ORAL | Status: AC
  Administered 2011-02-21: 40 meq via ORAL
  Filled 2011-02-21: qty 2

## 2011-02-21 NOTE — Progress Notes (Signed)
Assessment:  1. 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   2. Acute Kidney Injury,resolved 3. Hypokalemia Plan:  1 Continue hydration 2 Will start Amiloride and HCTZ 3. K replace  S:c/o nausea while eating O:BP 139/90  Pulse 80  Temp(Src) 98 F (36.7 C) (Oral)  Resp 20  Ht 5\' 6"  (1.676 m)  Wt 58.8 kg (129 lb 10.1 oz)  BMI 20.92 kg/m2  SpO2 100%  Intake/Output Summary (Last 24 hours) at 02/21/11 1120 Last data filed at 02/21/11 1100  Gross per 24 hour  Intake   4655 ml  Output   3425 ml  Net   1230 ml   Weight change: -2.1 kg (-4 lb 10.1 oz) ZOX:WRUEAVW better today, cheeks fuller Resp:comfortable breating UJW:JXBJ Ext:no edema, skin turgor improved    . aspirin EC  81 mg Oral Daily  . Chlorhexidine Gluconate Cloth  6 each Topical Q0600  . cholecalciferol  1,000 Units Oral Daily  . levothyroxine  125 mcg Oral QAC breakfast  . megestrol  800 mg Oral Daily  . mupirocin ointment  1 application Nasal BID  . OLANZapine  20 mg Oral QHS  . pantoprazole  20 mg Oral Q1200  . polyethylene glycol  17 g Oral Daily  . senna-docusate  2 tablet Oral QHS  . simvastatin  20 mg Oral Daily  . traZODone  50 mg Oral QHS   No results found. BMET  Lab 02/21/11 0530 02/20/11 0512 02/19/11 1140 02/18/11 0500 02/17/11 0730 02/16/11 1340 02/16/11 1100  NA 145 149* 137 158* 154* 147* 148*  K 3.2* 3.7 3.8 3.6 3.6 3.3* 3.1*  CL 117* 117* 105 127* 124* 118* 119*  CO2 18* 19 18* 20 21 19  18*  GLUCOSE 135* 138* 192* 92 89 134* 147*  BUN 13 17 50* 18 15 17 18   CREATININE 1.00 1.06 2.84* 1.12* 1.10 1.32* 1.36*  ALB -- -- -- -- -- -- --  CALCIUM 8.7 8.9 8.3* 9.3 9.0 8.7 8.6  PHOS 3.1 -- -- -- -- -- --   CBC  Lab 02/17/11 0730 02/15/11 0450 02/14/11 1644  WBC 7.9 9.3 11.1*  NEUTROABS -- -- 8.2*  HGB 9.1* 10.8* 12.1  HCT 30.7* 37.0 41.7  MCV 80.6 82.8 82.4  PLT 180 237 268   Lino Wickliff C

## 2011-02-21 NOTE — Progress Notes (Signed)
For Clinical Social Work for Hospitalist:   Complete Psych Assessment and Psychosocial in shadow chart. Referral for patient from SNF: [facility].  FL2 also completed and updated for patient to return to SNF when medically stable.  MD please sign.  Ashley Jacobs, MSW LCSW [phone]

## 2011-02-21 NOTE — Progress Notes (Addendum)
Clinical Social Work Psychiatry  Assessment    Presenting Symptoms/Problems: 77 year old with PMH Depression, lithium induce Diabetes insipidus who was transfer from nursing Home facility due to abnormal labs. Per nurse from The Facility patient was receiving lithium. Patient had Lab work done that show increase sodium level. Patient received 2 L of D 5 1/2 ns, sodium level was repeated and it was still high at 167. Patient mentation has decline for months. Patient has not been eating or drinking.   Per nursing staff at [facility], patient has not been her normal self as evidence by picking at things in the air, being internally preoccupied, depressed and confused (AMS)  Psychiatric History: Patient has diagnosis of Bipolar for many years. The end of November 2012 patient was admitted to [facility] by her outpatient psych MD: [facility] with Dr. Ezra Sites [phone]).  Patient is not currently SI, HI and was not at the time.  Patient's medications were causing AVH and multiple problems, thus outpatient Psych MD referred to [facility].   Per nursing staff at nursing home, patient has been more confused and not her normal self.   POA reports patient has been to all different types of psych hospitals including, [place], [place], [facility], and outpatient with [facility]. Patient has also received ECT at a Psych hospital Phot[facility] in [place] back 25-30 years ago). Patient has also been on all different types of medications, and recently stopped Lithium and Lamictal after taking for 30 some years.       Family Collateral Information: No family currently in the room and patient unable to verbalize needs or remote history due to AMS.  Spoke with Tammy at nursing home who reports patient psych history.  But patient is typically one who is alert and oriented, able to carry on a conversation and answer questions appropriately.  Has history of depression, but compliant with  medications and treatment with outpatient psych MD.  Patient  can walk and complete ADLS, but also uses a wheelchair to get around because it is easier.  Patient was placed at [facility] [place] after an admission to [facility] over 2-3 years ago.  Patient currently resides at [facility] [place], has a daughter who is POA, [name].  Patient is able to return to SNF once medically stable per SNF: [facility].  Patient  Patient daughter reports patient had a UTI around September and went to [facility] hospital. Daughter reports patient has not returned to her normal self (able to walk all the time, and now uses a wheelchair most of the time.)  Daughter reports patient has had Bipolar since she was very little and most recent hospitalization at [facility] was through Thanksgiving and Christmas and patient started on Zyprexa.  Patient went to hospital because she began to hear voices telling her she was going to die and the devil was after her to take her to hell.              Emotional Health/Current Symptoms:     Suicide/Self harm: No evidence per report of staff and patient unable to verbalize.  Patient had a sitter in place for safety, but that has been discontinued.  Daughter reports not SI recently      Attention/Behavioral/Psychotic Symptoms: Met with patient at bedside.  Per nursing staff patient has been very confused and "talking to herself and out of her head".  Patient was given Ativan and in the last 24 hours, patient has been sleeping.  Today, patient is lying in the bed, appears very anxious and afraid.  When asked multiple questions, she reports "I don't know" to every questions.  Patient is not alert or oriented at this time.  Patient is not aggressive or combative.  Staff is aware of situation and no sitter is present at this time.      Interpretive Summary/Anticipated DC Plan:   1.  Regarding disposition, patient to return to SNF: [facility] [place] and family and facility all  agreeable.  If returning to SNF, patient will have to have sitter dc 24 hours before return.  Patient at this time does not have the capacity to make decisions due to AMS, thus POA is very involved and providing information.  Can be reached: [name]:  [phone]. 2.  Review of medications per request of POA and making sure patient is on right medications. 3.  Follow up with Psych MD: at [facility] with regards to outpatient appointment. 4.  Unclear if patient is experiencing psychosis or AVH due to patient not being able to verbalize anything other than "I don't know". 5.  If patient does not clear and continues to decline, would consider a referral to Geriatric Psych Unit. 6.  Will discuss case with psych MD and continue to follow patient. 7. Spoke with patient outpatient Psych MD and patient has an appointment on March 14, 2011 at 11:30 am.  Ashley Jacobs, MSW LCSW [phone]

## 2011-02-21 NOTE — Progress Notes (Signed)
Pt"s tongue has white crust forming on it.no c/o pain

## 2011-02-21 NOTE — Progress Notes (Signed)
Subjective: Feels a little depressed. Otherwise no complaints.  Objective: Vital signs in last 24 hours: Temp:  [97.5 F (36.4 C)-98.5 F (36.9 C)] 98.3 F (36.8 C) (02/13 1400) Pulse Rate:  [62-86] 86  (02/13 1400) Resp:  [20] 20  (02/13 1400) BP: (127-139)/(74-103) 130/103 mmHg (02/13 1400) SpO2:  [97 %-100 %] 100 % (02/13 1400) Weight:  [58.8 kg (129 lb 10.1 oz)] 58.8 kg (129 lb 10.1 oz) (02/13 0500) Weight change: -2.1 kg (-4 lb 10.1 oz)    Intake/Output from previous day: 02/12 0701 - 02/13 0700 In: 4475 [I.V.:4475] Out: 7350 [Urine:7350] Total I/O In: 660 [P.O.:660] Out: -    Physical Exam: General: Alert, awake, oriented x3, in no acute distress. HEENT: No bruits, no goiter. Heart: Regular rate and rhythm, without murmurs, rubs, gallops. Lungs: Clear to auscultation bilaterally. Abdomen: Soft, nontender, nondistended, positive bowel sounds. Extremities: No clubbing cyanosis or edema with positive pedal pulses.    Lab Results: Basic Metabolic Panel:  Basename 02/21/11 0530 02/20/11 0512  NA 145 149*  K 3.2* 3.7  CL 117* 117*  CO2 18* 19  GLUCOSE 135* 138*  BUN 13 17  CREATININE 1.00 1.06  CALCIUM 8.7 8.9  MG -- --  PHOS 3.1 --   Liver Function Tests:  Basename 02/21/11 0530  AST --  ALT --  ALKPHOS --  BILITOT --  PROT --  ALBUMIN 2.9*   CBG:  Basename 02/21/11 0802 02/20/11 2229 02/20/11 1645 02/20/11 1125 02/20/11 0724 02/19/11 2200  GLUCAP 109* 145* 131* 101* 129* 106*   Urine Drug Screen: Drugs of Abuse     Component Value Date/Time   LABOPIA NONE DETECTED 06/26/2008 0535   COCAINSCRNUR NONE DETECTED 06/26/2008 0535   LABBENZ POSITIVE* 06/26/2008 0535   AMPHETMU NONE DETECTED 06/26/2008 0535   THCU NONE DETECTED 06/26/2008 0535   LABBARB  Value: NONE DETECTED        DRUG SCREEN FOR MEDICAL PURPOSES ONLY.  IF CONFIRMATION IS NEEDED FOR ANY PURPOSE, NOTIFY LAB WITHIN 5 DAYS.        LOWEST DETECTABLE LIMITS FOR URINE DRUG SCREEN Drug Class        Cutoff (ng/mL) Amphetamine      1000 Barbiturate      200 Benzodiazepine   200 Tricyclics       300 Opiates          300 Cocaine          300 THC              50 06/26/2008 0535     Recent Results (from the past 240 hour(s))  MRSA PCR SCREENING     Status: Abnormal   Collection Time   02/15/11 12:04 AM      Component Value Range Status Comment   MRSA by PCR POSITIVE (*) NEGATIVE  Final     Studies/Results: No results found.  Medications: Scheduled Meds:   . aMILoride  5 mg Oral Daily  . aspirin EC  81 mg Oral Daily  . Chlorhexidine Gluconate Cloth  6 each Topical Q0600  . cholecalciferol  1,000 Units Oral Daily  . hydrochlorothiazide  25 mg Oral Daily  . levothyroxine  125 mcg Oral QAC breakfast  . megestrol  800 mg Oral Daily  . mupirocin ointment  1 application Nasal BID  . OLANZapine  20 mg Oral QHS  . pantoprazole  20 mg Oral Q1200  . polyethylene glycol  17 g Oral Daily  . potassium chloride  40 mEq Oral Once  . senna-docusate  2 tablet Oral QHS  . simvastatin  20 mg Oral Daily  . traZODone  50 mg Oral QHS   Continuous Infusions:   . dextrose 125 mL/hr at 02/21/11 1452  . DISCONTD: dextrose 200 mL/hr at 02/21/11 0941   PRN Meds:.acetaminophen, albuterol, ondansetron (ZOFRAN) IV, ondansetron  Assessment/Plan:  Principal Problem:  *Hypernatremia Active Problems:  Essential hypertension, benign  Depressive disorder, not elsewhere classified  Unspecified hypothyroidism  Encephalopathy   #1 hypernatremia: Secondary to nephrogenic diabetes insipidus associated with chronic lithium use. Lithium has been discontinued. Electrolytes have improved. Nephrology is following and has added hydrochlorothiazide and amiloride to her regimen.  #2 hypertension: Well controlled.  #3 metabolic encephalopathy: Likely secondary to electrolyte abnormalities. Resolved.  #4 bipolar disorder: I have asked psychiatry to consult for alternate treatment regimen for her bipolar given  the fact that we can no longer use lithium. She is starting to feel somewhat depressed as of yesterday.  #5 disposition: Will likely be medically stable for discharge to skilled nursing facility in the morning.   LOS: 7 days   [facility] Pager: [phone] 02/21/2011, 3:46 PM

## 2011-02-22 DIAGNOSIS — F3130 Bipolar disorder, current episode depressed, mild or moderate severity, unspecified: Secondary | ICD-10-CM

## 2011-02-22 DIAGNOSIS — K920 Hematemesis: Secondary | ICD-10-CM

## 2011-02-22 LAB — CBC
HCT: 38.4 % (ref 36.0–46.0)
Hemoglobin: 9.9 g/dL — ABNORMAL LOW (ref 12.0–15.0)
Hemoglobin: 11.7 g/dL — ABNORMAL LOW (ref 12.0–15.0)
MCH: 24.1 pg — ABNORMAL LOW (ref 26.0–34.0)
MCH: 24.5 pg — ABNORMAL LOW (ref 26.0–34.0)
Platelets: 258 10*3/uL (ref 150–400)
RBC: 4.1 MIL/uL (ref 3.87–5.11)
RBC: 4.77 MIL/uL (ref 3.87–5.11)
WBC: 13.7 10*3/uL — ABNORMAL HIGH (ref 4.0–10.5)

## 2011-02-22 LAB — BASIC METABOLIC PANEL
Calcium: 9.2 mg/dL (ref 8.4–10.5)
GFR calc Af Amer: 61 mL/min — ABNORMAL LOW (ref >90)
GFR calc non Af Amer: 52 mL/min — ABNORMAL LOW (ref >90)
Glucose, Bld: 147 mg/dL — ABNORMAL HIGH (ref 70–99)
Potassium: 4.4 mEq/L (ref 3.5–5.1)
Sodium: 140 mEq/L (ref 135–145)

## 2011-02-22 LAB — GLUCOSE, CAPILLARY
Glucose-Capillary: 149 mg/dL — ABNORMAL HIGH (ref 70–99)
Glucose-Capillary: 138 mg/dL — ABNORMAL HIGH (ref 70–99)
Glucose-Capillary: 118 mg/dL — ABNORMAL HIGH (ref 70–99)

## 2011-02-22 MED ORDER — PANTOPRAZOLE SODIUM 40 MG PO TBEC: 40.0000 mg | DELAYED_RELEASE_TABLET | Freq: Every day | ORAL | Status: DC

## 2011-02-22 MED ORDER — HYDROXYZINE HCL 25 MG PO TABS: 25.0000 mg | ORAL_TABLET | Freq: Three times a day (TID) | ORAL | Status: DC | PRN

## 2011-02-22 MED ORDER — PALIPERIDONE ER 3 MG PO TB24
3.0000 mg | ORAL_TABLET | Freq: Every day | ORAL | Status: DC
  Administered 2011-02-22 – 2011-02-23 (×2): 3 mg via ORAL
  Filled 2011-02-22 (×4): qty 1

## 2011-02-22 MED ORDER — PROMETHAZINE HCL 25 MG/ML IJ SOLN
12.5000 mg | INTRAMUSCULAR | Status: DC | PRN
  Administered 2011-02-22: 12.5 mg via INTRAVENOUS
  Filled 2011-02-22: qty 1

## 2011-02-22 MED ORDER — PANTOPRAZOLE SODIUM 40 MG IV SOLR
40.0000 mg | Freq: Two times a day (BID) | INTRAVENOUS | Status: DC
  Administered 2011-02-22: 40 mg via INTRAVENOUS
  Filled 2011-02-22 (×6): qty 40

## 2011-02-22 MED ORDER — LORAZEPAM 2 MG/ML IJ SOLN
0.5000 mg | Freq: Four times a day (QID) | INTRAMUSCULAR | Status: DC | PRN
  Administered 2011-02-22: 0.5 mg via INTRAVENOUS
  Filled 2011-02-22: qty 1

## 2011-02-22 NOTE — Progress Notes (Addendum)
Subjective: Had one episode of coffee-ground emesis this morning. Is currently awake, alert, cheerful.  Objective: Vital signs in last 24 hours: Temp:  [97.9 F (36.6 C)-99.4 F (37.4 C)] 99.4 F (37.4 C) (02/14 0950) Pulse Rate:  [77-105] 105  (02/14 0950) Resp:  [18-20] 18  (02/14 0950) BP: (112-130)/(64-103) 112/64 mmHg (02/14 0950) SpO2:  [96 %-100 %] 99 % (02/14 0950) Weight:  [60.374 kg (133 lb 1.6 oz)] 60.374 kg (133 lb 1.6 oz) (02/13 2057) Weight change: 1.574 kg (3 lb 7.5 oz)    Intake/Output from previous day: 02/13 0701 - 02/14 0700 In: 4502.5 [P.O.:980; I.V.:3522.5] Out: 5050 [Urine:5050] Total I/O In: 360 [P.O.:360] Out: 250 [Urine:250]   Physical Exam: General: Alert, awake, in no acute distress. HEENT: No bruits, no goiter. Heart: Regular rate and rhythm, without murmurs, rubs, gallops. Lungs: Clear to auscultation bilaterally. Abdomen: Soft, nontender, nondistended, positive bowel sounds. Extremities: No clubbing cyanosis or edema with positive pedal pulses.    Lab Results: Basic Metabolic Panel:  Basename 02/22/11 0520 02/21/11 0530  NA 140 145  K 4.4 3.2*  CL 113* 117*  CO2 17* 18*  GLUCOSE 147* 135*  BUN 26* 13  CREATININE 1.03 1.00  CALCIUM 9.2 8.7  MG -- --  PHOS -- 3.1   Liver Function Tests:  Basename 02/21/11 0530  AST --  ALT --  ALKPHOS --  BILITOT --  PROT --  ALBUMIN 2.9*   CBC:  Basename 02/22/11 0930 02/22/11 0520  WBC 17.2* 13.7*  NEUTROABS -- --  HGB 11.7* 9.9*  HCT 38.4 32.7*  MCV 80.5 79.8  PLT 304 258   CBG:  Basename 02/22/11 1206 02/22/11 0757 02/21/11 2109 02/21/11 1626 02/21/11 1152 02/21/11 0802  GLUCAP 138* 149* 122* 123* 188* 109*   Urine Drug Screen: Drugs of Abuse     Component Value Date/Time   LABOPIA NONE DETECTED 06/26/2008 0535   COCAINSCRNUR NONE DETECTED 06/26/2008 0535   LABBENZ POSITIVE* 06/26/2008 0535   AMPHETMU NONE DETECTED 06/26/2008 0535   THCU NONE DETECTED 06/26/2008 0535   LABBARB  Value: NONE DETECTED        DRUG SCREEN FOR MEDICAL PURPOSES ONLY.  IF CONFIRMATION IS NEEDED FOR ANY PURPOSE, NOTIFY LAB WITHIN 5 DAYS.        LOWEST DETECTABLE LIMITS FOR URINE DRUG SCREEN Drug Class       Cutoff (ng/mL) Amphetamine      1000 Barbiturate      200 Benzodiazepine   200 Tricyclics       300 Opiates          300 Cocaine          300 THC              50 06/26/2008 0535     Recent Results (from the past 240 hour(s))  MRSA PCR SCREENING     Status: Abnormal   Collection Time   02/15/11 12:04 AM      Component Value Range Status Comment   MRSA by PCR POSITIVE (*) NEGATIVE  Final     Studies/Results: No results found.  Medications: Scheduled Meds:   . aMILoride  5 mg Oral Daily  . cholecalciferol  1,000 Units Oral Daily  . hydrochlorothiazide  25 mg Oral Daily  . levothyroxine  125 mcg Oral QAC breakfast  . megestrol  800 mg Oral Daily  . mupirocin ointment  1 application Nasal BID  . paliperidone  3 mg Oral QHS  . pantoprazole    40 mg Oral Q1200  . polyethylene glycol  17 g Oral Daily  . potassium chloride  40 mEq Oral Once  . senna-docusate  2 tablet Oral QHS  . simvastatin  20 mg Oral Daily  . traZODone  50 mg Oral QHS  . DISCONTD: aspirin EC  81 mg Oral Daily  . DISCONTD: OLANZapine  20 mg Oral QHS  . DISCONTD: pantoprazole  20 mg Oral Q1200   Continuous Infusions:   . dextrose 5 % with kcl 125 mL/hr at 02/22/11 0139  . 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 Lithium has been discontinued. Electrolytes have improved. Nephrology is following and has  added hydrochlorothiazide and amiloride. Today I will decrease her IV fluids and liberalize by mouth fluids.  #2 hypertension: Well controlled.  #3 metabolic encephalopathy: Resolved. Likely secondary to electrolyte abnormalities.  #4 coffee-ground emesis: Only one episode. Her hemoglobin has not dropped. Continue to follow. If no further episodes, proceed with discharge to skilled nursing facility in the morning. ASA discontinued. Protonix increased from 20 to 40 mg daily.  #5 bipolar disorder: Appreciate psychiatry consultation by Dr. Bogard. Psych medications have been adjusted as per her recommendations.  #6 disposition: Discontinue Foley catheter, decrease rate of IV fluids, likely discharge to skilled nursing facility in the morning.   LOS: 8 days   HERNANDEZ ACOSTA,Jerrard Bradburn [facility] Pager: [phone] 02/22/2011, 12:41 PM    

## 2011-02-22 NOTE — Progress Notes (Signed)
02/22/2011  0800  Called into room to place pt back on telemetry as a lead had come off.  Upon entering the room, pt was covered in dark brown/black coffee ground like emesis.  It was a very large amount and was all over the front of the patient, as well as in a puddle underneath the patient.  Pt stated that she had just thrown up but felt better now.  Pt denied any stomach pain, but stomach was slightly distended, nontender with bowel sounds present on auscultation.  We cleaned patient up and vitals signs were stable.  Dr. Ardyth Harps notified initially by Gala Romney., RN, with orders received.  Dr. Edythe Lynn was re-notified by me shortly thereafter to follow up and decide if patient needed to be NPO.  Dr. Ardyth Harps stated that the patient could eat, and wewould see how the CBC results came back and go from there.  Pt currently stable, sitting up in the chair, no complaints.  Will continue to monitor patient.  Eunice Blase

## 2011-02-22 NOTE — Consult Note (Signed)
Reason for Consult: Coffee-ground emesis Referring Physician: [facility]  Wallis Mart HPI: This is a 77 year old female who was admitted to the hospital with nephrogenic DI.  She has been treated for this issue and her sodium has normalized, but she is noted to have two episodes of coffee-ground emesis.  HGB is stable.  With her dementia she is not able to provide any useful history.  Past Medical History  Diagnosis Date  . Thyroid cancer   . Ulcer     chronic  . Depression   . Vitamin d deficiency     No past surgical history on file.  No family history on file.  Social History:  reports that she has quit smoking. Her smoking use included Cigarettes. She does not have any smokeless tobacco history on file. She reports that she does not drink alcohol. Her drug history not on file.  Allergies:  Allergies  Allergen Reactions  . Penicillins   . Sulfa Antibiotics     Medications:  Scheduled:   . aMILoride  5 mg Oral Daily  . cholecalciferol  1,000 Units Oral Daily  . hydrochlorothiazide  25 mg Oral Daily  . levothyroxine  125 mcg Oral QAC breakfast  . megestrol  800 mg Oral Daily  . mupirocin ointment  1 application Nasal BID  . paliperidone  3 mg Oral QHS  . pantoprazole (PROTONIX) IV  40 mg Intravenous Q12H  . polyethylene glycol  17 g Oral Daily  . potassium chloride  40 mEq Oral Once  . senna-docusate  2 tablet Oral QHS  . simvastatin  20 mg Oral Daily  . traZODone  50 mg Oral QHS  . DISCONTD: aspirin EC  81 mg Oral Daily  . DISCONTD: OLANZapine  20 mg Oral QHS  . DISCONTD: pantoprazole  20 mg Oral Q1200  . DISCONTD: pantoprazole  40 mg Oral Q1200   Continuous:   . dextrose 5 % with kcl 125 mL/hr at 02/22/11 0139  . dextrose 125 mL/hr at 02/21/11 1452    Results for orders placed during the hospital encounter of 02/14/11 (from the past 24 hour(s))  GLUCOSE, CAPILLARY     Status: Abnormal   Collection Time   02/21/11  4:26 PM      Component Value  Range   Glucose-Capillary 123 (*) 70 - 99 (mg/dL)   Comment 1 Documented in Chart     Comment 2 Notify RN    GLUCOSE, CAPILLARY     Status: Abnormal   Collection Time   02/21/11  9:09 PM      Component Value Range   Glucose-Capillary 122 (*) 70 - 99 (mg/dL)   Comment 1 Documented in Chart     Comment 2 Notify RN    BASIC METABOLIC PANEL     Status: Abnormal   Collection Time   02/22/11  5:20 AM      Component Value Range   Sodium 140  135 - 145 (mEq/L)   Potassium 4.4  3.5 - 5.1 (mEq/L)   Chloride 113 (*) 96 - 112 (mEq/L)   CO2 17 (*) 19 - 32 (mEq/L)   Glucose, Bld 147 (*) 70 - 99 (mg/dL)   BUN 26 (*) 6 - 23 (mg/dL)   Creatinine, Ser 4.54  0.50 - 1.10 (mg/dL)   Calcium 9.2  8.4 - 09.8 (mg/dL)   GFR calc non Af Amer 52 (*) >90 (mL/min)   GFR calc Af Amer 61 (*) >90 (mL/min)  CBC  Status: Abnormal   Collection Time   02/22/11  5:20 AM      Component Value Range   WBC 13.7 (*) 4.0 - 10.5 (K/uL)   RBC 4.10  3.87 - 5.11 (MIL/uL)   Hemoglobin 9.9 (*) 12.0 - 15.0 (g/dL)   HCT 16.1 (*) 09.6 - 46.0 (%)   MCV 79.8  78.0 - 100.0 (fL)   MCH 24.1 (*) 26.0 - 34.0 (pg)   MCHC 30.3  30.0 - 36.0 (g/dL)   RDW 04.5 (*) 40.9 - 15.5 (%)   Platelets 258  150 - 400 (K/uL)  GLUCOSE, CAPILLARY     Status: Abnormal   Collection Time   02/22/11  7:57 AM      Component Value Range   Glucose-Capillary 149 (*) 70 - 99 (mg/dL)  CBC     Status: Abnormal   Collection Time   02/22/11  9:30 AM      Component Value Range   WBC 17.2 (*) 4.0 - 10.5 (K/uL)   RBC 4.77  3.87 - 5.11 (MIL/uL)   Hemoglobin 11.7 (*) 12.0 - 15.0 (g/dL)   HCT 81.1  91.4 - 78.2 (%)   MCV 80.5  78.0 - 100.0 (fL)   MCH 24.5 (*) 26.0 - 34.0 (pg)   MCHC 30.5  30.0 - 36.0 (g/dL)   RDW 95.6 (*) 21.3 - 15.5 (%)   Platelets 304  150 - 400 (K/uL)  GLUCOSE, CAPILLARY     Status: Abnormal   Collection Time   02/22/11 12:06 PM      Component Value Range   Glucose-Capillary 138 (*) 70 - 99 (mg/dL)     No results found.  ROS:  As  stated above in the HPI otherwise negative.  Blood pressure 110/78, pulse 104, temperature 98.4 F (36.9 C), temperature source Oral, resp. rate 18, height 5\' 6"  (1.676 m), weight 60.374 kg (133 lb 1.6 oz), SpO2 99.00%.    PE: Gen: NAD, Alert and Oriented HEENT:  [place]/AT, EOMI Neck: Supple, no LAD Lungs: CTA Bilaterally CV: RRR without M/G/R ABM: Soft, NTND, +BS Ext: No C/C/E  Assessment/Plan: 1) Coffee-ground emesis 2) Nephrogenic DI - resolved 3) Dementia   Further evaluation with an EGD is warranted.  She is hemodynamically stable and her HGB is stable.  She may have an esophagitis as a cause of the coffee-ground emesis.  Plan: 1) EGD tomorrow.  Ita Fritzsche D 02/22/2011, 2:48 PM

## 2011-02-22 NOTE — Progress Notes (Signed)
Assessment:  1. 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  2. Acute Kidney Injury,resolved  3. Hypokalemia, resolved Plan:  1 Continue hydration, decrease rate, liberalize PO fluids 2 Will cont. Amiloride and HCTZ    S:Feels ok O:BP 112/64  Pulse 105  Temp(Src) 99.4 F (37.4 C) (Oral)  Resp 18  Ht 5\' 6"  (1.676 m)  Wt 60.374 kg (133 lb 1.6 oz)  BMI 21.48 kg/m2  SpO2 99%  Intake/Output Summary (Last 24 hours) at 02/22/11 1033 Last data filed at 02/22/11 0900  Gross per 24 hour  Intake 4622.5 ml  Output   5300 ml  Net -677.5 ml   Weight change: 1.574 kg (3 lb 7.5 oz) ZOX:WRUE CVS:RRR Resp:No distress AVW:UJWJ Ext:no edema skin turgor still diminished    . aMILoride  5 mg Oral Daily  . cholecalciferol  1,000 Units Oral Daily  . hydrochlorothiazide  25 mg Oral Daily  . levothyroxine  125 mcg Oral QAC breakfast  . megestrol  800 mg Oral Daily  . mupirocin ointment  1 application Nasal BID  . OLANZapine  20 mg Oral QHS  . pantoprazole  40 mg Oral Q1200  . polyethylene glycol  17 g Oral Daily  . potassium chloride  40 mEq Oral Once  . senna-docusate  2 tablet Oral QHS  . simvastatin  20 mg Oral Daily  . traZODone  50 mg Oral QHS  . DISCONTD: aspirin EC  81 mg Oral Daily  . DISCONTD: pantoprazole  20 mg Oral Q1200   No results found. BMET  Lab 02/22/11 0520 02/21/11 0530 02/20/11 0512 02/19/11 1140 02/18/11 0500 02/17/11 0730 02/16/11 1340  NA 140 145 149* 137 158* 154* 147*  K 4.4 3.2* 3.7 3.8 3.6 3.6 3.3*  CL 113* 117* 117* 105 127* 124* 118*  CO2 17* 18* 19 18* 20 21 19   GLUCOSE 147* 135* 138* 192* 92 89 134*  BUN 26* 13 17 50* 18 15 17   CREATININE 1.03 1.00 1.06 2.84* 1.12* 1.10 1.32*  ALB -- -- -- -- -- -- --  CALCIUM 9.2 8.7 8.9 8.3* 9.3 9.0 8.7  PHOS -- 3.1 -- -- -- -- --   CBC  Lab 02/22/11 0520 02/17/11 0730  WBC 13.7* 7.9  NEUTROABS -- --  HGB 9.9* 9.1*  HCT 32.7* 30.7*  MCV 79.8 80.6  PLT 258 180    Lean Fayson C

## 2011-02-22 NOTE — Consult Note (Addendum)
Reason for Consult:Bipolar Disorder, Mental Status Change Referring Physician: Dr. Sheliah Hatch is an 77 y.o. female.  HPI: Pt had slowly decompensated in nursing home by refusing to walk, then refusing liquids and meals.  She was admitted with hypernatremia, lleukocytosis, abnormal labs. She was picking at things in the air.  Medication evaluation is requested.   AXIS  I  Bipolar Disorder, Delirium vs Dementia AXIS  II AXIS III   Past Medical History  Diagnosis Date  . Thyroid cancer   . Ulcer     chronic  . Depression   . Vitamin d deficiency     No past surgical history on file.  No family history on file.  Social History:  reports that she has quit smoking. Her smoking use included Cigarettes. She does not have any smokeless tobacco history on file. She reports that she does not drink alcohol. Her drug history not on file.  Allergies:  Allergies  Allergen Reactions  . Penicillins   . Sulfa Antibiotics     Medications: I have reviewed the patient's current medications.  Results for orders placed during the hospital encounter of 02/14/11 (from the past 48 hour(s))  GLUCOSE, CAPILLARY     Status: Abnormal   Collection Time   02/20/11 11:25 AM      Component Value Range Comment   Glucose-Capillary 101 (*) 70 - 99 (mg/dL)   GLUCOSE, CAPILLARY     Status: Abnormal   Collection Time   02/20/11  4:45 PM      Component Value Range Comment   Glucose-Capillary 131 (*) 70 - 99 (mg/dL)   GLUCOSE, CAPILLARY     Status: Abnormal   Collection Time   02/20/11 10:29 PM      Component Value Range Comment   Glucose-Capillary 145 (*) 70 - 99 (mg/dL)   RENAL FUNCTION PANEL     Status: Abnormal   Collection Time   02/21/11  5:30 AM      Component Value Range Comment   Sodium 145  135 - 145 (mEq/L)    Potassium 3.2 (*) 3.5 - 5.1 (mEq/L)    Chloride 117 (*) 96 - 112 (mEq/L)    CO2 18 (*) 19 - 32 (mEq/L)    Glucose, Bld 135 (*) 70 - 99 (mg/dL)    BUN 13  6 - 23 (mg/dL)      Creatinine, Ser 1.61  0.50 - 1.10 (mg/dL)    Calcium 8.7  8.4 - 10.5 (mg/dL)    Phosphorus 3.1  2.3 - 4.6 (mg/dL)    Albumin 2.9 (*) 3.5 - 5.2 (g/dL)    GFR calc non Af Amer 54 (*) >90 (mL/min)    GFR calc Af Amer 63 (*) >90 (mL/min)   GLUCOSE, CAPILLARY     Status: Abnormal   Collection Time   02/21/11  8:02 AM      Component Value Range Comment   Glucose-Capillary 109 (*) 70 - 99 (mg/dL)    Comment 1 Documented in Chart      Comment 2 Notify RN     GLUCOSE, CAPILLARY     Status: Abnormal   Collection Time   02/21/11 11:52 AM      Component Value Range Comment   Glucose-Capillary 188 (*) 70 - 99 (mg/dL)    Comment 1 Documented in Chart      Comment 2 Notify RN     GLUCOSE, CAPILLARY     Status: Abnormal   Collection Time   02/21/11  4:26 PM      Component Value Range Comment   Glucose-Capillary 123 (*) 70 - 99 (mg/dL)    Comment 1 Documented in Chart      Comment 2 Notify RN     GLUCOSE, CAPILLARY     Status: Abnormal   Collection Time   02/21/11  9:09 PM      Component Value Range Comment   Glucose-Capillary 122 (*) 70 - 99 (mg/dL)    Comment 1 Documented in Chart      Comment 2 Notify RN     BASIC METABOLIC PANEL     Status: Abnormal   Collection Time   02/22/11  5:20 AM      Component Value Range Comment   Sodium 140  135 - 145 (mEq/L)    Potassium 4.4  3.5 - 5.1 (mEq/L)    Chloride 113 (*) 96 - 112 (mEq/L)    CO2 17 (*) 19 - 32 (mEq/L)    Glucose, Bld 147 (*) 70 - 99 (mg/dL)    BUN 26 (*) 6 - 23 (mg/dL) DELTA CHECK NOTED   Creatinine, Ser 1.03  0.50 - 1.10 (mg/dL)    Calcium 9.2  8.4 - 10.5 (mg/dL)    GFR calc non Af Amer 52 (*) >90 (mL/min)    GFR calc Af Amer 61 (*) >90 (mL/min)   CBC     Status: Abnormal   Collection Time   02/22/11  5:20 AM      Component Value Range Comment   WBC 13.7 (*) 4.0 - 10.5 (K/uL)    RBC 4.10  3.87 - 5.11 (MIL/uL)    Hemoglobin 9.9 (*) 12.0 - 15.0 (g/dL)    HCT 91.4 (*) 78.2 - 46.0 (%)    MCV 79.8  78.0 - 100.0 (fL)    MCH 24.1  (*) 26.0 - 34.0 (pg)    MCHC 30.3  30.0 - 36.0 (g/dL)    RDW 95.6 (*) 21.3 - 15.5 (%)    Platelets 258  150 - 400 (K/uL)   GLUCOSE, CAPILLARY     Status: Abnormal   Collection Time   02/22/11  7:57 AM      Component Value Range Comment   Glucose-Capillary 149 (*) 70 - 99 (mg/dL)   CBC     Status: Abnormal   Collection Time   02/22/11  9:30 AM      Component Value Range Comment   WBC 17.2 (*) 4.0 - 10.5 (K/uL)    RBC 4.77  3.87 - 5.11 (MIL/uL)    Hemoglobin 11.7 (*) 12.0 - 15.0 (g/dL)    HCT 08.6  57.8 - 46.9 (%)    MCV 80.5  78.0 - 100.0 (fL)    MCH 24.5 (*) 26.0 - 34.0 (pg)    MCHC 30.5  30.0 - 36.0 (g/dL)    RDW 62.9 (*) 52.8 - 15.5 (%)    Platelets 304  150 - 400 (K/uL)     No results found.  Review of Systems  Unable to perform ROS: other   Blood pressure 112/64, pulse 105, temperature 99.4 F (37.4 C), temperature source Oral, resp. rate 18, height 5\' 6"  (1.676 m), weight 60.374 kg (133 lb 1.6 oz), SpO2 99.00%. Physical Exam  Assessment/Plan: Discussed with Dr. Ardyth Harps and Psych CSW Dr. Ardyth Harps questions the Zyprexa 20 mg for this pt.  She is seen awake, calm and alert.  She pauses when asked each question.  Her standard reply indicates memory loss of short and  long term memory.  Given her blood chemistry abnormal electrolytes, anemia,  high dose of SGA Zyprexa and hypothyroidism, memory and executive function may be recovered to some degree with correction.  It is suggested a lower dose of SGA may be preferred.  The origin of using Seroquel to Zyprexa is not known. Later this afternoon, RN informs pat has coffee-ground emesis, Gastroenterology is  Contacted.  RECOMMENDATON:  1.  Consider a low dose paliperidone [active metabolite of resperidone and less sedating ] 3 mg at bedtime This is eliminated through kidneys if renal function is considered stable. 2.  Vistaril 25 mg may help calm anxiety, agitation; kpromote sleep and use to calm any restlessness or dystonia with  SGA paliperidone (Invega)  3. Initiation of medication needs close supervision given  the black box warning for  the elderly with the often need of such medication Consider Benefit vs. Risk. 4. Pt's plan to transfer to the nursing home is approved once patient is stable.  5. MD Psychiatrist may be called if additional consultation is requested.   Otherwise, MD Psychiatrist signs off.    Rosellen Lichtenberger 02/22/2011, 11:18 AM

## 2011-02-22 NOTE — Progress Notes (Signed)
Discussed patient with Psych MD and also attending medical doctor.  Still awaiting clarification of bipolar medication, and once addressed, attending MD feels patient would be ready for transfer back to SNF.  FL2 completed and in chart for patient to sign.  Patient able to return per facility along with family .  No other needs at this time.  Plan for disposition is SNF.  CSW handed off to CSW [place] with [facility].  Ashley Jacobs, MSW LCSW [phone]

## 2011-02-22 NOTE — Progress Notes (Signed)
Patient npo 

## 2011-02-22 NOTE — Progress Notes (Signed)
Patient had a large amount of coffee ground looking emesis - vss and patient denies any pain at this time.  Patient has been in nsr on tele but did a short burst of svt up to 160 - now back down without any intervention nsr 90's - patient continues to deny pain but is sleeping holding her stomach.  Dr Ardyth Harps aware - continue to monitor.  Mervin Hack rn

## 2011-02-23 ENCOUNTER — Encounter (HOSPITAL_COMMUNITY)
Admission: EM | Disposition: A | Discharge status: Skilled Nursing Facility | Payer: Self-pay | Source: Home / Self Care | Attending: Internal Medicine

## 2011-02-23 ENCOUNTER — Encounter (HOSPITAL_COMMUNITY): Payer: Self-pay | Admitting: *Deleted

## 2011-02-23 HISTORY — PX: ESOPHAGOGASTRODUODENOSCOPY: SHX5428

## 2011-02-23 LAB — CBC
HCT: 35.4 % — ABNORMAL LOW (ref 36.0–46.0)
Hemoglobin: 11 g/dL — ABNORMAL LOW (ref 12.0–15.0)
MCV: 78.3 fL (ref 78.0–100.0)
RDW: 19.3 % — ABNORMAL HIGH (ref 11.5–15.5)
WBC: 21 10*3/uL — ABNORMAL HIGH (ref 4.0–10.5)

## 2011-02-23 LAB — BASIC METABOLIC PANEL
BUN: 27 mg/dL — ABNORMAL HIGH (ref 6–23)
CO2: 19 mEq/L (ref 19–32)
Chloride: 103 mEq/L (ref 96–112)
Creatinine, Ser: 1.29 mg/dL — ABNORMAL HIGH (ref 0.50–1.10)
GFR calc Af Amer: 46 mL/min — ABNORMAL LOW (ref >90)
Potassium: 4.7 mEq/L (ref 3.5–5.1)

## 2011-02-23 LAB — GLUCOSE, CAPILLARY
Glucose-Capillary: 128 mg/dL — ABNORMAL HIGH (ref 70–99)
Glucose-Capillary: 127 mg/dL — ABNORMAL HIGH (ref 70–99)

## 2011-02-23 SURGERY — EGD (ESOPHAGOGASTRODUODENOSCOPY)
Anesthesia: Moderate Sedation

## 2011-02-23 MED ORDER — FENTANYL NICU IV SYRINGE 50 MCG/ML
INJECTION | INTRAMUSCULAR | Status: DC | PRN
  Administered 2011-02-23: 20 ug via INTRAVENOUS

## 2011-02-23 MED ORDER — MIDAZOLAM HCL 10 MG/2ML IJ SOLN
INTRAMUSCULAR | Status: DC | PRN
  Administered 2011-02-23: 2 mg via INTRAVENOUS

## 2011-02-23 MED ORDER — FENTANYL CITRATE 0.05 MG/ML IJ SOLN
INTRAMUSCULAR | Status: AC
  Filled 2011-02-23: qty 2

## 2011-02-23 MED ORDER — MIDAZOLAM HCL 10 MG/2ML IJ SOLN
INTRAMUSCULAR | Status: AC
  Filled 2011-02-23: qty 2

## 2011-02-23 MED ORDER — PANTOPRAZOLE SODIUM 40 MG PO TBEC
40.0000 mg | DELAYED_RELEASE_TABLET | Freq: Every day | ORAL | Status: DC
  Administered 2011-02-23 – 2011-02-24 (×2): 40 mg via ORAL
  Filled 2011-02-23 (×2): qty 1

## 2011-02-23 NOTE — Progress Notes (Signed)
Assessment:  1. 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  2. Acute Kidney Injury,resolved  3. Hypokalemia, resolved  $.Depression Plan:  1 Continue hydration, decrease rate, liberalize PO fluids  2 Will cont. Amiloride and HCTZ , Check labs  I think psychiatry attending needs to see patient.   S:SLEEPY TODAY O:BP 115/61  Pulse 109  Temp(Src) 99.1 F (37.3 C) (Oral)  Resp 18  Ht 5\' 6"  (1.676 m)  Wt 60.374 kg (133 lb 1.6 oz)  BMI 21.48 kg/m2  SpO2 92%  Intake/Output Summary (Last 24 hours) at 02/23/11 1220 Last data filed at 02/23/11 1610  Gross per 24 hour  Intake    772 ml  Output      3 ml  Net    769 ml   Weight change:  RUE:[id number] ILL APPEARING Resp:COMFORATBLE NWG:NFAO Ext:NO EDEMA SKIN TURGOR DIMINISHED    . aMILoride  5 mg Oral Daily  . cholecalciferol  1,000 Units Oral Daily  . hydrochlorothiazide  25 mg Oral Daily  . levothyroxine  125 mcg Oral QAC breakfast  . megestrol  800 mg Oral Daily  . mupirocin ointment  1 application Nasal BID  . paliperidone  3 mg Oral QHS  . pantoprazole (PROTONIX) IV  40 mg Intravenous Q12H  . polyethylene glycol  17 g Oral Daily  . senna-docusate  2 tablet Oral QHS  . simvastatin  20 mg Oral Daily  . traZODone  50 mg Oral QHS  . DISCONTD: OLANZapine  20 mg Oral QHS  . DISCONTD: pantoprazole  40 mg Oral Q1200   No results found. BMET  Lab 02/22/11 0520 02/21/11 0530 02/20/11 0512 02/19/11 1140 02/18/11 0500 02/17/11 0730 02/16/11 1340  NA 140 145 149* 137 158* 154* 147*  K 4.4 3.2* 3.7 3.8 3.6 3.6 3.3*  CL 113* 117* 117* 105 127* 124* 118*  CO2 17* 18* 19 18* 20 21 19   GLUCOSE 147* 135* 138* 192* 92 89 134*  BUN 26* 13 17 50* 18 15 17   CREATININE 1.03 1.00 1.06 2.84* 1.12* 1.10 1.32*  ALB -- -- -- -- -- -- --  CALCIUM 9.2 8.7 8.9 8.3* 9.3 9.0 8.7  PHOS -- 3.1 -- -- -- -- --   CBC  Lab 02/22/11 0930 02/22/11 0520 02/17/11 0730  WBC 17.2* 13.7* 7.9  NEUTROABS --  -- --  HGB 11.7* 9.9* 9.1*  HCT 38.4 32.7* 30.7*  MCV 80.5 79.8 80.6  PLT 304 258 180   Charisa Twitty C

## 2011-02-23 NOTE — Op Note (Signed)
[facility] [address] [place], [place]  [address]  OPERATIVE PROCEDURE REPORT  PATIENT:  [name], [name]  MR#:  [id number] BIRTHDATE:  09-16-1934  GENDER:  female ENDOSCOPIST:  Jeani Hawking, MD ASSISTANT:  Ara Kussmaul, Technician and Dwain Sarna, RN CGRN PROCEDURE DATE:  02/23/2011 PROCEDURE:  EGD, diagnostic [phone] ASA CLASS:  Class III INDICATIONS:  Coffee-ground emesis MEDICATIONS:  Fentanyl 20 mcg IV, Versed 2 mg IV  DESCRIPTION OF PROCEDURE:   After the risks benefits and alternatives of the procedure were thoroughly explained, informed consent was obtained.  The Pentax Gastroscope [id number] endoscope was introduced through the mouth and advanced to the second portion of the duodenum, without limitations.  The instrument was slowly withdrawn as the mucosa was fully examined. <<PROCEDUREIMAGES>>  FINDINGS: A small Zenker's diverticulum was identified. No evidence of esophagitis. The Z-line was sharp. There was a large 12 cm hiatal hernia. No other abnormalities identified. Retroflexed views revealed a hiatal hernia.    The scope was then withdrawn from the patient and the procedure terminated.  COMPLICATIONS:  None  IMPRESSION:  1) Hiatal hernia 2) Small Zenker's diverticulum RECOMMENDATIONS:  1) No further GI evaluation.  ______________________________ Jeani Hawking, MD  n. Rosalie DoctorJeani Hawking at 02/23/2011 10:39 AM  Wallis Mart, [phone]

## 2011-02-23 NOTE — Interval H&P Note (Signed)
History and Physical Interval Note:  02/23/2011 10:18 AM  [name]  has presented today for surgery, with the diagnosis of Coffee-ground emesis  The various methods of treatment have been discussed with the patient and family. After consideration of risks, benefits and other options for treatment, the patient has consented to  Procedure(s) (LRB): ESOPHAGOGASTRODUODENOSCOPY (EGD) (N/A) as a surgical intervention .  The patients' history has been reviewed, patient examined, no change in status, stable for surgery.  I have reviewed the patients' chart and labs.  Questions were answered to the patient's satisfaction.     Shadonna Benedick D

## 2011-02-23 NOTE — H&P (View-Only) (Signed)
Subjective: Had one episode of coffee-ground emesis this morning. Is currently awake, alert, cheerful.  Objective: Vital signs in last 24 hours: Temp:  [97.9 F (36.6 C)-99.4 F (37.4 C)] 99.4 F (37.4 C) (02/14 0950) Pulse Rate:  [77-105] 105  (02/14 0950) Resp:  [18-20] 18  (02/14 0950) BP: (112-130)/(64-103) 112/64 mmHg (02/14 0950) SpO2:  [96 %-100 %] 99 % (02/14 0950) Weight:  [60.374 kg (133 lb 1.6 oz)] 60.374 kg (133 lb 1.6 oz) (02/13 2057) Weight change: 1.574 kg (3 lb 7.5 oz)    Intake/Output from previous day: 02/13 0701 - 02/14 0700 In: 4502.5 [P.O.:980; I.V.:3522.5] Out: 5050 [Urine:5050] Total I/O In: 360 [P.O.:360] Out: 250 [Urine:250]   Physical Exam: General: Alert, awake, in no acute distress. HEENT: No bruits, no goiter. Heart: Regular rate and rhythm, without murmurs, rubs, gallops. Lungs: Clear to auscultation bilaterally. Abdomen: Soft, nontender, nondistended, positive bowel sounds. Extremities: No clubbing cyanosis or edema with positive pedal pulses.    Lab Results: Basic Metabolic Panel:  Basename 02/22/11 0520 02/21/11 0530  NA 140 145  K 4.4 3.2*  CL 113* 117*  CO2 17* 18*  GLUCOSE 147* 135*  BUN 26* 13  CREATININE 1.03 1.00  CALCIUM 9.2 8.7  MG -- --  PHOS -- 3.1   Liver Function Tests:  Basename 02/21/11 0530  AST --  ALT --  ALKPHOS --  BILITOT --  PROT --  ALBUMIN 2.9*   CBC:  Basename 02/22/11 0930 02/22/11 0520  WBC 17.2* 13.7*  NEUTROABS -- --  HGB 11.7* 9.9*  HCT 38.4 32.7*  MCV 80.5 79.8  PLT 304 258   CBG:  Basename 02/22/11 1206 02/22/11 0757 02/21/11 2109 02/21/11 1626 02/21/11 1152 02/21/11 0802  GLUCAP 138* 149* 122* 123* 188* 109*   Urine Drug Screen: Drugs of Abuse     Component Value Date/Time   LABOPIA NONE DETECTED 06/26/2008 0535   COCAINSCRNUR NONE DETECTED 06/26/2008 0535   LABBENZ POSITIVE* 06/26/2008 0535   AMPHETMU NONE DETECTED 06/26/2008 0535   THCU NONE DETECTED 06/26/2008 0535   LABBARB  Value: NONE DETECTED        DRUG SCREEN FOR MEDICAL PURPOSES ONLY.  IF CONFIRMATION IS NEEDED FOR ANY PURPOSE, NOTIFY LAB WITHIN 5 DAYS.        LOWEST DETECTABLE LIMITS FOR URINE DRUG SCREEN Drug Class       Cutoff (ng/mL) Amphetamine      1000 Barbiturate      200 Benzodiazepine   200 Tricyclics       300 Opiates          300 Cocaine          300 THC              50 06/26/2008 0535     Recent Results (from the past 240 hour(s))  MRSA PCR SCREENING     Status: Abnormal   Collection Time   02/15/11 12:04 AM      Component Value Range Status Comment   MRSA by PCR POSITIVE (*) NEGATIVE  Final     Studies/Results: No results found.  Medications: Scheduled Meds:   . aMILoride  5 mg Oral Daily  . cholecalciferol  1,000 Units Oral Daily  . hydrochlorothiazide  25 mg Oral Daily  . levothyroxine  125 mcg Oral QAC breakfast  . megestrol  800 mg Oral Daily  . mupirocin ointment  1 application Nasal BID  . paliperidone  3 mg Oral QHS  . pantoprazole  40 mg Oral Q1200  . polyethylene glycol  17 g Oral Daily  . potassium chloride  40 mEq Oral Once  . senna-docusate  2 tablet Oral QHS  . simvastatin  20 mg Oral Daily  . traZODone  50 mg Oral QHS  . DISCONTD: aspirin EC  81 mg Oral Daily  . DISCONTD: OLANZapine  20 mg Oral QHS  . DISCONTD: pantoprazole  20 mg Oral Q1200   Continuous Infusions:   . dextrose 5 % with kcl 125 mL/hr at 02/22/11 0139  . 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 Lithium has been discontinued. Electrolytes have improved. Nephrology is following and has  added hydrochlorothiazide and amiloride. Today I will decrease her IV fluids and liberalize by mouth fluids.  #2 hypertension: Well controlled.  #3 metabolic encephalopathy: Resolved. Likely secondary to electrolyte abnormalities.  #4 coffee-ground emesis: Only one episode. Her hemoglobin has not dropped. Continue to follow. If no further episodes, proceed with discharge to skilled nursing facility in the morning. ASA discontinued. Protonix increased from 20 to 40 mg daily.  #5 bipolar disorder: Appreciate psychiatry consultation by Dr. Ferol Luz. Psych medications have been adjusted as per her recommendations.  #6 disposition: Discontinue Foley catheter, decrease rate of IV fluids, likely discharge to skilled nursing facility in the morning.   LOS: 8 days   [facility] Pager: [phone] 02/22/2011, 12:41 PM

## 2011-02-23 NOTE — Progress Notes (Signed)
Subjective: Patient seen earlier today while in the endo suite. No further episodes of coffee-ground emesis.  Objective: Vital signs in last 24 hours: Temp:  [97.3 F (36.3 C)-99.6 F (37.6 C)] 99.6 F (37.6 C) (02/15 1347) Pulse Rate:  [61-118] 118  (02/15 1347) Resp:  [13-81] 18  (02/15 1347) BP: (89-127)/(47-92) 127/92 mmHg (02/15 1347) SpO2:  [92 %-100 %] 100 % (02/15 1347) Weight change:  Last BM Date: 02/22/11  Intake/Output from previous day: 02/14 0701 - 02/15 0700 In: 1132 [P.O.:600; I.V.:530; IV Piggyback:2] Out: 253 [Urine:250; Stool:3]     Physical Exam: General: Alert, awake, in no acute distress. HEENT: No bruits, no goiter. Heart: Regular rate and rhythm, without murmurs, rubs, gallops. Lungs: Clear to auscultation bilaterally. Abdomen: Soft, nontender, nondistended, positive bowel sounds. Extremities: No clubbing cyanosis or edema with positive pedal pulses.    Lab Results: Basic Metabolic Panel:  Basename 02/23/11 1215 02/22/11 0520 02/21/11 0530  NA 137 140 --  K 4.7 4.4 --  CL 103 113* --  CO2 19 17* --  GLUCOSE 125* 147* --  BUN 27* 26* --  CREATININE 1.29* 1.03 --  CALCIUM 9.8 9.2 --  MG -- -- --  PHOS -- -- 3.1   Liver Function Tests:  Basename 02/21/11 0530  AST --  ALT --  ALKPHOS --  BILITOT --  PROT --  ALBUMIN 2.9*   CBC:  Basename 02/23/11 1215 02/22/11 0930  WBC 21.0* 17.2*  NEUTROABS -- --  HGB 11.0* 11.7*  HCT 35.4* 38.4  MCV 78.3 80.5  PLT 325 304   CBG:  Basename 02/23/11 1201 02/23/11 0810 02/22/11 2217 02/22/11 1638 02/22/11 1206 02/22/11 0757  GLUCAP 127* 128* 126* 118* 138* 149*   Urine Drug Screen: Drugs of Abuse     Component Value Date/Time   LABOPIA NONE DETECTED 06/26/2008 0535   COCAINSCRNUR NONE DETECTED 06/26/2008 0535   LABBENZ POSITIVE* 06/26/2008 0535   AMPHETMU NONE DETECTED 06/26/2008 0535   THCU NONE DETECTED 06/26/2008 0535   LABBARB  Value: NONE DETECTED        DRUG SCREEN FOR MEDICAL  PURPOSES ONLY.  IF CONFIRMATION IS NEEDED FOR ANY PURPOSE, NOTIFY LAB WITHIN 5 DAYS.        LOWEST DETECTABLE LIMITS FOR URINE DRUG SCREEN Drug Class       Cutoff (ng/mL) Amphetamine      1000 Barbiturate      200 Benzodiazepine   200 Tricyclics       300 Opiates          300 Cocaine          300 THC              50 06/26/2008 0535     Recent Results (from the past 240 hour(s))  MRSA PCR SCREENING     Status: Abnormal   Collection Time   02/15/11 12:04 AM      Component Value Range Status Comment   MRSA by PCR POSITIVE (*) NEGATIVE  Final     Studies/Results: No results found.  Medications: Scheduled Meds:    . aMILoride  5 mg Oral Daily  . cholecalciferol  1,000 Units Oral Daily  . hydrochlorothiazide  25 mg Oral Daily  . levothyroxine  125 mcg Oral QAC breakfast  . megestrol  800 mg Oral Daily  . mupirocin ointment  1 application Nasal BID  . paliperidone  3 mg Oral QHS  . pantoprazole (PROTONIX) IV  40 mg Intravenous Q12H  .  polyethylene glycol  17 g Oral Daily  . senna-docusate  2 tablet Oral QHS  . simvastatin  20 mg Oral Daily  . traZODone  50 mg Oral QHS   Continuous Infusions:    . dextrose 5 % with kcl 125 mL/hr at 02/22/11 0139  . 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 Lithium has been discontinued. Electrolytes have improved. Nephrology is following and has added hydrochlorothiazide and amiloride. Today I will discontinue her IV fluids and liberalize by mouth fluids.  #2 hypertension: Well  controlled.  #3 metabolic encephalopathy: Resolved. Likely secondary to electrolyte abnormalities.  #4 coffee-ground emesis: two episodes. Her hemoglobin has not dropped. GI has seen and EGD done that only has finsings of a large hiatal hernia. Continue BID PPI and no ASA for now.  #5 bipolar disorder: Appreciate psychiatry consultation by Dr. Ferol Luz. Psych medications have been adjusted as per her recommendations.  #6 disposition: Discontinue Foley catheter, decrease rate of IV fluids, likely discharge to skilled nursing facility in the morning.   LOS: 9 days   [name],[name] Pager: [phone] 02/23/2011, 3:11 PM

## 2011-02-24 LAB — CBC
HCT: 31.5 % — ABNORMAL LOW (ref 36.0–46.0)
MCH: 24.6 pg — ABNORMAL LOW (ref 26.0–34.0)
MCV: 78.4 fL (ref 78.0–100.0)
RDW: 19.1 % — ABNORMAL HIGH (ref 11.5–15.5)
WBC: 12.9 10*3/uL — ABNORMAL HIGH (ref 4.0–10.5)

## 2011-02-24 LAB — BASIC METABOLIC PANEL
CO2: 20 mEq/L (ref 19–32)
Chloride: 104 mEq/L (ref 96–112)
GFR calc Af Amer: 41 mL/min — ABNORMAL LOW (ref >90)
Potassium: 4.4 mEq/L (ref 3.5–5.1)

## 2011-02-24 LAB — GLUCOSE, CAPILLARY: Glucose-Capillary: 106 mg/dL — ABNORMAL HIGH (ref 70–99)

## 2011-02-24 MED ORDER — HYDROXYZINE HCL 25 MG PO TABS: 25.0000 mg | ORAL_TABLET | Freq: Three times a day (TID) | ORAL | Status: AC | PRN

## 2011-02-24 MED ORDER — PALIPERIDONE ER 3 MG PO TB24: 3.0000 mg | ORAL_TABLET | Freq: Every day | ORAL | Status: DC

## 2011-02-24 MED ORDER — HYDROCHLOROTHIAZIDE 12.5 MG PO CAPS: 25.0000 mg | ORAL_CAPSULE | Freq: Every day | ORAL | Status: DC

## 2011-02-24 MED ORDER — AMILORIDE HCL 5 MG PO TABS: 5.0000 mg | ORAL_TABLET | Freq: Every day | ORAL | Status: DC

## 2011-02-24 NOTE — Discharge Summary (Signed)
Physician Discharge Summary  Patient ID: [name] MRN: [id number] DOB/AGE: 1934/08/08 77 y.o.  Admit date: 02/14/2011 Discharge date: 02/24/2011  Primary Care Physician:  No primary provider on file.   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 Active Problems:  Essential hypertension, benign  Depressive disorder, not elsewhere classified  Unspecified hypothyroidism  Encephalopathy  Coffee ground emesis    Medication List  As of 02/24/2011  9:28 AM   STOP taking these medications         aspirin EC 81 MG tablet      dextrose 5 % and 0.45% NaCl 5-0.45 %      fluticasone 0.05 % cream      lithium carbonate 450 MG CR tablet      Menthol-Zinc Oxide 0.2-20 % Pste      OLANZapine 20 MG tablet      promethazine 25 MG tablet      QUEtiapine 300 MG 24 hr tablet      Salicylic Acid 6 % Sham      SENNA S 8.6-50 MG per tablet         TAKE these medications         ALPRAZolam 0.25 MG tablet   Commonly known as: XANAX   Take 0.25 mg by mouth 3 (three) times daily as needed. anxiety      aMILoride 5 MG tablet   Commonly known as: MIDAMOR   Take 1 tablet (5 mg total) by mouth daily.      amLODipine 5 MG tablet   Commonly known as: NORVASC   Take 5 mg by mouth daily.      atorvastatin 10 MG tablet   Commonly known as: LIPITOR   Take 10 mg by mouth at bedtime.      calcium-vitamin D 500-200 MG-UNIT per tablet   Commonly known as: OSCAL WITH D   Take 2 tablets by mouth daily.      cholecalciferol 1000 UNITS tablet   Commonly known as: VITAMIN D   Take 1,000 Units by mouth daily.      clobetasol 0.05 % external solution   Commonly known as: TEMOVATE   Apply 1 application topically 2 (two) times daily.      diclofenac sodium 1 % Gel   Commonly known as: VOLTAREN   Apply 1 application topically 4 (four) times daily. Apply 4 grams to left shoulder and left knee        DULoxetine 20 MG capsule   Commonly known as: CYMBALTA   Take 20 mg by mouth daily.      guaiFENesin 600 MG 12 hr tablet   Commonly known as: MUCINEX   Take 1,200 mg by mouth 2 (two) times daily as needed. congestion      hydrochlorothiazide 12.5 MG capsule   Commonly known as: MICROZIDE   Take 2 capsules (25 mg total) by mouth daily.      HYDROcodone-acetaminophen 5-500 MG per tablet   Commonly known as: VICODIN   Take 1 tablet by mouth every 6 (six) hours as needed. Pain        hydrOXYzine 25 MG tablet   Commonly known as: ATARAX/VISTARIL   Take 1 tablet (25 mg total) by mouth 3 (three) times daily as needed for anxiety.      lansoprazole 30 MG capsule   Commonly known as: PREVACID   Take 30 mg by mouth 2 (two) times daily.      levothyroxine 125  MCG tablet   Commonly known as: SYNTHROID, LEVOTHROID   Take 125 mcg by mouth daily.      megestrol 625 MG/5ML suspension   Commonly known as: MEGACE ES   Take 625 mg by mouth daily.      multivitamin tablet   Take 1 tablet by mouth daily.      paliperidone 3 MG 24 hr tablet   Commonly known as: INVEGA   Take 1 tablet (3 mg total) by mouth at bedtime.      polyethylene glycol packet   Commonly known as: MIRALAX / GLYCOLAX   Take 17 g by mouth 2 (two) times daily.      traZODone 50 MG tablet   Commonly known as: DESYREL   Take 50 mg by mouth at bedtime.             Disposition and Follow-up:  Patient will be discharged today back to skilled nursing facility in stable and improved condition. She is never to be started back on lithium.  Consults:  nephrology Dr. Lowell Guitar   Significant Diagnostic Studies:  No results found.  Brief H and P: For complete details please refer to admission H and P, but in brief patient is a 77 year old with PMH Depression, lithium induce Diabetes insipidus who was transfer from nursing Home facility due to abnormal labs. Per nurse from The Facility patient was receiving lithium. Patient had Lab work done that  show increase sodium level. Patient received 2 L of D 5 1/2 ns, sodium level was repeated and it was still high at 167. Patient mentation has decline for months. Patient has not been eating or drinking. Because of this was admitted to our service for further evaluation and management.       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 Lithium has been discontinued. Electrolytes have improved. Nephrology has added hydrochlorothiazide and amiloride. She is no longer receiving IV fluids and despite this her sodium is stable at 136. She is never to be restarted on lithium again.  #2 hypertension: Well controlled.  #3 metabolic encephalopathy: Resolved. Likely secondary to severe hyponatremia.  #4 coffee-ground emesis: Patient had 2 episodes of coffee-ground emesis that delayed her discharge. Her hemoglobin has not dropped. GI, Dr. Elnoria Howard, was consulted and an EGD was performed that did not show evidence for gastritis or esophagitis and only showed a large hiatal hernia. He has not recommended any further GI followup at this time. I have decided to discontinue her aspirin and placed on twice daily proton pump inhibitor for now.  #5 bipolar disorder: Was seen in consultation by psychiatrist Dr. Ferol Luz. She recommended to add Invega and Vistaril and to discontinue Zyprexa.  Patient has been deemed stable for discharge back to skilled nursing facility today.  Time spent on Discharge: Greater than 30 minutes.  SignedChaya Jan [facility] Pager: [phone] 02/24/2011, 9:28 AM

## 2011-02-24 NOTE — Progress Notes (Signed)
CSW notified facility of pt d/c. Packet is prepared, placed in [place]. [facility] called for transport. CSW signing off.  Baxter Flattery, MSW [phone]

## 2011-02-24 NOTE — Progress Notes (Signed)
Patient IV removed - catheter intact. Foley catheter removed prior to discharge - had not voided before transportation arrived. Condition stable upon departure.

## 2011-02-26 ENCOUNTER — Encounter (HOSPITAL_COMMUNITY): Payer: Self-pay | Admitting: Gastroenterology

## 2011-03-16 ENCOUNTER — Ambulatory Visit (HOSPITAL_COMMUNITY): Payer: Medicare Other

## 2011-03-22 ENCOUNTER — Ambulatory Visit (HOSPITAL_COMMUNITY): Payer: Medicare Other

## 2011-04-12 ENCOUNTER — Other Ambulatory Visit: Payer: Self-pay | Admitting: Internal Medicine

## 2011-04-12 ENCOUNTER — Ambulatory Visit (HOSPITAL_COMMUNITY)
Admission: RE | Admit: 2011-04-12 | Discharge: 2011-04-12 | Disposition: A | Discharge status: Home / Self Care | Payer: Medicare Other | Source: Ambulatory Visit | Attending: Internal Medicine | Admitting: Internal Medicine

## 2011-04-12 DIAGNOSIS — Z13820 Encounter for screening for osteoporosis: Secondary | ICD-10-CM

## 2011-04-12 DIAGNOSIS — Z1231 Encounter for screening mammogram for malignant neoplasm of breast: Secondary | ICD-10-CM

## 2011-04-12 DIAGNOSIS — Z780 Asymptomatic menopausal state: Secondary | ICD-10-CM | POA: Insufficient documentation

## 2011-09-04 ENCOUNTER — Other Ambulatory Visit (HOSPITAL_COMMUNITY): Payer: Self-pay | Admitting: *Deleted

## 2011-09-05 ENCOUNTER — Encounter (HOSPITAL_COMMUNITY)
Admission: RE | Admit: 2011-09-05 | Discharge: 2011-09-05 | Disposition: A | Discharge status: Home / Self Care | Payer: Medicare Other | Source: Ambulatory Visit | Attending: Internal Medicine | Admitting: Internal Medicine

## 2011-09-05 DIAGNOSIS — D649 Anemia, unspecified: Secondary | ICD-10-CM | POA: Insufficient documentation

## 2011-09-05 LAB — TYPE AND SCREEN
ABO/RH(D): A POS
Antibody Screen: NEGATIVE

## 2011-09-05 LAB — ABO/RH: ABO/RH(D): A POS

## 2011-09-05 NOTE — Progress Notes (Signed)
0800 medication list from nursing home attached to chart.

## 2011-09-05 NOTE — Progress Notes (Signed)
IV start attempted by Rosalio Macadamia, RN and 2 IV nurses without success. Called [facility] to reported to nurse Kenney Houseman and Dr. Renato Gails. Orders received.

## 2011-09-06 ENCOUNTER — Emergency Department (HOSPITAL_COMMUNITY): Payer: Medicare Other

## 2011-09-06 ENCOUNTER — Other Ambulatory Visit: Payer: Self-pay | Admitting: Internal Medicine

## 2011-09-06 ENCOUNTER — Encounter (HOSPITAL_COMMUNITY): Payer: Self-pay | Admitting: Emergency Medicine

## 2011-09-06 ENCOUNTER — Inpatient Hospital Stay (HOSPITAL_COMMUNITY)
Admission: EM | Admit: 2011-09-06 | Discharge: 2011-09-09 | DRG: 812 | Disposition: A | Discharge status: Skilled Nursing Facility | Payer: Medicare Other | Attending: Internal Medicine | Admitting: Internal Medicine

## 2011-09-06 DIAGNOSIS — Z79899 Other long term (current) drug therapy: Secondary | ICD-10-CM

## 2011-09-06 DIAGNOSIS — D649 Anemia, unspecified: Secondary | ICD-10-CM

## 2011-09-06 DIAGNOSIS — I10 Essential (primary) hypertension: Secondary | ICD-10-CM | POA: Diagnosis present

## 2011-09-06 DIAGNOSIS — Z87891 Personal history of nicotine dependence: Secondary | ICD-10-CM

## 2011-09-06 DIAGNOSIS — F0390 Unspecified dementia without behavioral disturbance: Secondary | ICD-10-CM | POA: Diagnosis present

## 2011-09-06 DIAGNOSIS — D62 Acute posthemorrhagic anemia: Secondary | ICD-10-CM

## 2011-09-06 DIAGNOSIS — F3289 Other specified depressive episodes: Secondary | ICD-10-CM | POA: Diagnosis present

## 2011-09-06 DIAGNOSIS — T438X5A Adverse effect of other psychotropic drugs, initial encounter: Secondary | ICD-10-CM | POA: Diagnosis present

## 2011-09-06 DIAGNOSIS — G309 Alzheimer's disease, unspecified: Secondary | ICD-10-CM | POA: Diagnosis present

## 2011-09-06 DIAGNOSIS — F0280 Dementia in other diseases classified elsewhere without behavioral disturbance: Secondary | ICD-10-CM | POA: Diagnosis present

## 2011-09-06 DIAGNOSIS — M1990 Unspecified osteoarthritis, unspecified site: Secondary | ICD-10-CM | POA: Diagnosis present

## 2011-09-06 DIAGNOSIS — E232 Diabetes insipidus: Secondary | ICD-10-CM | POA: Diagnosis present

## 2011-09-06 DIAGNOSIS — F329 Major depressive disorder, single episode, unspecified: Secondary | ICD-10-CM

## 2011-09-06 DIAGNOSIS — K5792 Diverticulitis of intestine, part unspecified, without perforation or abscess without bleeding: Secondary | ICD-10-CM

## 2011-09-06 DIAGNOSIS — F319 Bipolar disorder, unspecified: Secondary | ICD-10-CM | POA: Diagnosis present

## 2011-09-06 DIAGNOSIS — D509 Iron deficiency anemia, unspecified: Principal | ICD-10-CM | POA: Diagnosis present

## 2011-09-06 DIAGNOSIS — F411 Generalized anxiety disorder: Secondary | ICD-10-CM | POA: Diagnosis present

## 2011-09-06 DIAGNOSIS — K449 Diaphragmatic hernia without obstruction or gangrene: Secondary | ICD-10-CM | POA: Diagnosis present

## 2011-09-06 DIAGNOSIS — E785 Hyperlipidemia, unspecified: Secondary | ICD-10-CM | POA: Diagnosis present

## 2011-09-06 DIAGNOSIS — R1900 Intra-abdominal and pelvic swelling, mass and lump, unspecified site: Secondary | ICD-10-CM

## 2011-09-06 DIAGNOSIS — Z862 Personal history of diseases of the blood and blood-forming organs and certain disorders involving the immune mechanism: Secondary | ICD-10-CM

## 2011-09-06 DIAGNOSIS — E039 Hypothyroidism, unspecified: Secondary | ICD-10-CM | POA: Diagnosis present

## 2011-09-06 HISTORY — DX: Hypothyroidism, unspecified: E03.9

## 2011-09-06 HISTORY — DX: Essential (primary) hypertension: I10

## 2011-09-06 HISTORY — DX: Dementia in other diseases classified elsewhere, unspecified severity, without behavioral disturbance, psychotic disturbance, mood disturbance, and anxiety: F02.80

## 2011-09-06 HISTORY — DX: Anxiety disorder, unspecified: F41.9

## 2011-09-06 HISTORY — DX: Pneumonia, unspecified organism: J18.9

## 2011-09-06 HISTORY — DX: Bipolar disorder, unspecified: F31.9

## 2011-09-06 HISTORY — DX: Disorder of kidney and ureter, unspecified: N28.9

## 2011-09-06 HISTORY — DX: Unspecified osteoarthritis, unspecified site: M19.90

## 2011-09-06 HISTORY — DX: Alzheimer's disease, unspecified: G30.9

## 2011-09-06 HISTORY — DX: Other fatigue: R53.83

## 2011-09-06 HISTORY — DX: Dehydration: E86.0

## 2011-09-06 HISTORY — DX: Other malaise: R53.81

## 2011-09-06 HISTORY — DX: Unspecified dementia, unspecified severity, without behavioral disturbance, psychotic disturbance, mood disturbance, and anxiety: F03.90

## 2011-09-06 LAB — CBC WITH DIFFERENTIAL/PLATELET
Basophils Absolute: 0 10*3/uL (ref 0.0–0.1)
Lymphs Abs: 1.4 10*3/uL (ref 0.7–4.0)
MCH: 16.6 pg — ABNORMAL LOW (ref 26.0–34.0)
MCV: 62 fL — ABNORMAL LOW (ref 78.0–100.0)
Monocytes Absolute: 0.6 10*3/uL (ref 0.1–1.0)
Platelets: 320 10*3/uL (ref 150–400)
RDW: 20.2 % — ABNORMAL HIGH (ref 11.5–15.5)
WBC: 7.1 10*3/uL (ref 4.0–10.5)

## 2011-09-06 LAB — COMPREHENSIVE METABOLIC PANEL
AST: 15 U/L (ref 0–37)
Albumin: 3.3 g/dL — ABNORMAL LOW (ref 3.5–5.2)
Alkaline Phosphatase: 89 U/L (ref 39–117)
BUN: 26 mg/dL — ABNORMAL HIGH (ref 6–23)
Potassium: 4.4 mEq/L (ref 3.5–5.1)
Sodium: 142 mEq/L (ref 135–145)
Total Protein: 7.1 g/dL (ref 6.0–8.3)

## 2011-09-06 LAB — APTT: aPTT: 30 seconds (ref 24–37)

## 2011-09-06 LAB — RETICULOCYTES
RBC.: 4.43 MIL/uL (ref 3.87–5.11)
Retic Ct Pct: 1.4 % (ref 0.4–3.1)

## 2011-09-06 MED ORDER — SENNOSIDES-DOCUSATE SODIUM 8.6-50 MG PO TABS
2.0000 | ORAL_TABLET | Freq: Every day | ORAL | Status: DC
  Administered 2011-09-07 – 2011-09-08 (×3): 2 via ORAL
  Filled 2011-09-06 (×4): qty 2

## 2011-09-06 MED ORDER — MORPHINE SULFATE 2 MG/ML IJ SOLN: 1.0000 mg | INTRAMUSCULAR | Status: DC | PRN

## 2011-09-06 MED ORDER — SODIUM CHLORIDE 0.9 % IV SOLN: INTRAVENOUS | Status: DC

## 2011-09-06 MED ORDER — CALCIUM CARBONATE-VITAMIN D 500-200 MG-UNIT PO TABS
2.0000 | ORAL_TABLET | Freq: Every day | ORAL | Status: DC
  Administered 2011-09-07 – 2011-09-09 (×3): 2 via ORAL
  Filled 2011-09-06 (×3): qty 2

## 2011-09-06 MED ORDER — POLYETHYLENE GLYCOL 3350 17 G PO PACK
17.0000 g | PACK | Freq: Two times a day (BID) | ORAL | Status: DC
  Administered 2011-09-07 – 2011-09-09 (×5): 17 g via ORAL
  Filled 2011-09-06 (×7): qty 1

## 2011-09-06 MED ORDER — ALPRAZOLAM 0.25 MG PO TABS
0.2500 mg | ORAL_TABLET | Freq: Three times a day (TID) | ORAL | Status: DC | PRN
  Administered 2011-09-07: 0.25 mg via ORAL
  Filled 2011-09-06: qty 1

## 2011-09-06 MED ORDER — ONDANSETRON HCL 4 MG/2ML IJ SOLN: 4.0000 mg | Freq: Once | INTRAMUSCULAR | Status: DC | PRN

## 2011-09-06 MED ORDER — CLOBETASOL PROPIONATE 0.05 % EX SOLN: 1.0000 "application " | Freq: Two times a day (BID) | CUTANEOUS | Status: DC

## 2011-09-06 MED ORDER — DICLOFENAC SODIUM 1 % TD GEL
1.0000 "application " | Freq: Four times a day (QID) | TRANSDERMAL | Status: DC
  Administered 2011-09-07 – 2011-09-08 (×9): 1 via TOPICAL
  Filled 2011-09-06: qty 100

## 2011-09-06 MED ORDER — ADULT MULTIVITAMIN W/MINERALS CH
1.0000 | ORAL_TABLET | Freq: Every day | ORAL | Status: DC
  Administered 2011-09-07 – 2011-09-09 (×3): 1 via ORAL
  Filled 2011-09-06 (×3): qty 1

## 2011-09-06 MED ORDER — DULOXETINE HCL 30 MG PO CPEP
30.0000 mg | ORAL_CAPSULE | Freq: Every day | ORAL | Status: DC
  Administered 2011-09-07 – 2011-09-09 (×3): 30 mg via ORAL
  Filled 2011-09-06 (×3): qty 1

## 2011-09-06 MED ORDER — ONDANSETRON HCL 4 MG/2ML IJ SOLN: 4.0000 mg | Freq: Three times a day (TID) | INTRAMUSCULAR | Status: AC | PRN

## 2011-09-06 MED ORDER — LEVOTHYROXINE SODIUM 125 MCG PO TABS
125.0000 ug | ORAL_TABLET | Freq: Every day | ORAL | Status: DC
  Administered 2011-09-07 – 2011-09-09 (×3): 125 ug via ORAL
  Filled 2011-09-06 (×4): qty 1

## 2011-09-06 MED ORDER — PALIPERIDONE ER 3 MG PO TB24
3.0000 mg | ORAL_TABLET | Freq: Every day | ORAL | Status: DC
Start: 2011-09-06 — End: 2011-09-09
  Administered 2011-09-07 – 2011-09-08 (×3): 3 mg via ORAL
  Filled 2011-09-06 (×4): qty 1

## 2011-09-06 MED ORDER — VITAMIN D3 25 MCG (1000 UNIT) PO TABS
1000.0000 [IU] | ORAL_TABLET | Freq: Every day | ORAL | Status: DC
  Administered 2011-09-07 – 2011-09-09 (×3): 1000 [IU] via ORAL
  Filled 2011-09-06 (×3): qty 1

## 2011-09-06 MED ORDER — SENNA-DOCUSATE SODIUM 8.6-50 MG PO TABS: 2.0000 | ORAL_TABLET | Freq: Every day | ORAL | Status: DC

## 2011-09-06 MED ORDER — ATORVASTATIN CALCIUM 10 MG PO TABS
10.0000 mg | ORAL_TABLET | Freq: Every day | ORAL | Status: DC
  Administered 2011-09-07 – 2011-09-08 (×3): 10 mg via ORAL
  Filled 2011-09-06 (×4): qty 1

## 2011-09-06 MED ORDER — ONE-DAILY MULTI VITAMINS PO TABS: 1.0000 | ORAL_TABLET | Freq: Every day | ORAL | Status: DC

## 2011-09-06 MED ORDER — ACETAMINOPHEN 325 MG PO TABS: 650.0000 mg | ORAL_TABLET | Freq: Four times a day (QID) | ORAL | Status: DC | PRN

## 2011-09-06 MED ORDER — DEXTROSE-NACL 5-0.45 % IV SOLN: INTRAVENOUS | Status: AC

## 2011-09-06 MED ORDER — AMILORIDE HCL 5 MG PO TABS
5.0000 mg | ORAL_TABLET | Freq: Every day | ORAL | Status: DC
  Administered 2011-09-07 – 2011-09-09 (×3): 5 mg via ORAL
  Filled 2011-09-06 (×3): qty 1

## 2011-09-06 MED ORDER — TRAZODONE HCL 50 MG PO TABS
50.0000 mg | ORAL_TABLET | Freq: Every day | ORAL | Status: DC
  Administered 2011-09-07 – 2011-09-08 (×3): 50 mg via ORAL
  Filled 2011-09-06 (×4): qty 1

## 2011-09-06 MED ORDER — PANTOPRAZOLE SODIUM 40 MG PO TBEC
40.0000 mg | DELAYED_RELEASE_TABLET | Freq: Every day | ORAL | Status: DC
  Administered 2011-09-07 – 2011-09-09 (×3): 40 mg via ORAL
  Filled 2011-09-06 (×3): qty 1

## 2011-09-06 MED ORDER — HYDROXYZINE HCL 25 MG PO TABS
25.0000 mg | ORAL_TABLET | Freq: Three times a day (TID) | ORAL | Status: DC | PRN
  Filled 2011-09-06: qty 1

## 2011-09-06 MED ORDER — DESMOPRESSIN ACETATE 0.2 MG PO TABS
0.2000 mg | ORAL_TABLET | Freq: Two times a day (BID) | ORAL | Status: DC
  Administered 2011-09-07 – 2011-09-09 (×6): 0.2 mg via ORAL
  Filled 2011-09-06 (×8): qty 1

## 2011-09-06 MED ORDER — ONDANSETRON HCL 4 MG/2ML IJ SOLN
4.0000 mg | Freq: Four times a day (QID) | INTRAMUSCULAR | Status: DC | PRN
  Administered 2011-09-07 – 2011-09-08 (×2): 4 mg via INTRAVENOUS
  Filled 2011-09-06 (×2): qty 2

## 2011-09-06 MED ORDER — ACETAMINOPHEN 650 MG RE SUPP: 650.0000 mg | Freq: Four times a day (QID) | RECTAL | Status: DC | PRN

## 2011-09-06 MED ORDER — DESMOPRESSIN ACETATE 0.1 MG PO TABS
0.1000 mg | ORAL_TABLET | Freq: Every evening | ORAL | Status: DC
  Administered 2011-09-07 – 2011-09-08 (×2): 0.1 mg via ORAL
  Filled 2011-09-06 (×4): qty 1

## 2011-09-06 MED ORDER — HYDROCODONE-ACETAMINOPHEN 5-325 MG PO TABS: 1.0000 | ORAL_TABLET | Freq: Four times a day (QID) | ORAL | Status: DC | PRN

## 2011-09-06 MED ORDER — CLOBETASOL PROPIONATE 0.05 % EX CREA
TOPICAL_CREAM | Freq: Two times a day (BID) | CUTANEOUS | Status: DC
  Administered 2011-09-07 (×2): via TOPICAL
  Administered 2011-09-07: 1 via TOPICAL
  Administered 2011-09-08 (×2): via TOPICAL
  Filled 2011-09-06: qty 15

## 2011-09-06 MED ORDER — ONDANSETRON HCL 4 MG PO TABS: 4.0000 mg | ORAL_TABLET | Freq: Four times a day (QID) | ORAL | Status: DC | PRN

## 2011-09-06 MED ORDER — AMLODIPINE BESYLATE 5 MG PO TABS
5.0000 mg | ORAL_TABLET | Freq: Every day | ORAL | Status: DC
  Administered 2011-09-07 – 2011-09-09 (×3): 5 mg via ORAL
  Filled 2011-09-06 (×3): qty 1

## 2011-09-06 MED ORDER — SODIUM CHLORIDE 0.9 % IV SOLN
1000.0000 mL | INTRAVENOUS | Status: DC
  Administered 2011-09-06 (×2): 1000 mL via INTRAVENOUS

## 2011-09-06 NOTE — ED Notes (Signed)
EKG given to Dr. Knapp. 

## 2011-09-06 NOTE — ED Notes (Signed)
Spoke with Dr. Roselyn Bering regarding difficulty establishing IV access and drawing labs on patient. Per Dr. Lynelle Doctor it is ok to start IV in patient's foot.

## 2011-09-06 NOTE — ED Provider Notes (Addendum)
History     CSN: [id number]  Arrival date & time 09/06/11  1737   First MD Initiated Contact with Patient 09/06/11 1833      Chief Complaint  Patient presents with  . Anemia    (Consider location/radiation/quality/duration/timing/severity/associated sxs/prior treatment) Patient is a 77 y.o. female presenting with anemia. The history is limited by the condition of the patient (dementia, poor historian).  Anemia Pertinent negatives include no chest pain and no abdominal pain.   Pt denies any complaints.  She is not sure why she was sent to the ED.  EMS reports that pt is here for evaluation of anemia.  She denies abdominal , rectal bleeding.  No chest or shortness of breath. Past Medical History  Diagnosis Date  . Thyroid cancer   . Ulcer     chronic  . Depression   . Vitamin d deficiency   . Anxiety   . Malaise and fatigue   . Osteoarthritis   . Kidney disease   . Pneumonia   . Dementia   . Alzheimer disease   . Hypothyroidism   . Bipolar 1 disorder   . Dehydration   . Hypertension   . Diabetes mellitus     Past Surgical History  Procedure Date  . Esophagogastroduodenoscopy 02/23/2011    Procedure: ESOPHAGOGASTRODUODENOSCOPY (EGD);  Surgeon: Theda Belfast, MD;  Location: [facility] ENDOSCOPY;  Service: Endoscopy;  Laterality: N/A;    History reviewed. No pertinent family history.  History  Substance Use Topics  . Smoking status: Former Smoker    Types: Cigarettes  . Smokeless tobacco: Not on file  . Alcohol Use: No    OB History    Grav Para Term Preterm Abortions TAB SAB Ect Mult Living                  Review of Systems  Constitutional: Negative for fever.  Respiratory: Negative for cough.   Cardiovascular: Negative for chest pain.  Gastrointestinal: Negative for abdominal pain and blood in stool.  Genitourinary: Negative for dysuria.  Skin: Negative for rash.  Neurological: Negative for tremors.  Psychiatric/Behavioral: Positive for confusion.  All  other systems reviewed and are negative.    Allergies  Lithium; Penicillins; and Sulfa antibiotics  Home Medications   Current Outpatient Rx  Name Route Sig Dispense Refill  . ALPRAZOLAM 0.25 MG PO TABS Oral Take 0.25 mg by mouth 3 (three) times daily as needed. anxiety    . AMILORIDE HCL 5 MG PO TABS Oral Take 1 tablet (5 mg total) by mouth daily. 30 tablet 1  . AMLODIPINE BESYLATE 5 MG PO TABS Oral Take 5 mg by mouth daily.    . ATORVASTATIN CALCIUM 10 MG PO TABS Oral Take 10 mg by mouth at bedtime.     Marland Kitchen CALCIUM CARBONATE-VITAMIN D 500-200 MG-UNIT PO TABS Oral Take 2 tablets by mouth daily.     Marland Kitchen VITAMIN D 1000 UNITS PO TABS Oral Take 1,000 Units by mouth daily.    Marland Kitchen CLOBETASOL PROPIONATE 0.05 % EX SOLN Topical Apply 1 application topically 2 (two) times daily.    . DESMOPRESSIN ACETATE 0.1 MG PO TABS Oral Take 0.1 mg by mouth every evening.    . DESMOPRESSIN ACETATE 0.2 MG PO TABS Oral Take 0.2 mg by mouth 2 (two) times daily.    Marland Kitchen DICLOFENAC SODIUM 1 % TD GEL Topical Apply 1 application topically 4 (four) times daily. Apply 4 grams to left shoulder and left knee     .  DULOXETINE HCL 30 MG PO CPEP Oral Take 30 mg by mouth daily.    Marland Kitchen HYDROCODONE-ACETAMINOPHEN 5-500 MG PO TABS Oral Take 1 tablet by mouth every 6 (six) hours as needed. Pain     . HYDROXYZINE HCL 25 MG PO TABS Oral Take 25 mg by mouth 3 (three) times daily as needed. anxiety    . LANSOPRAZOLE 30 MG PO CPDR Oral Take 30 mg by mouth 2 (two) times daily.    Marland Kitchen LEVOTHYROXINE SODIUM 125 MCG PO TABS Oral Take 125 mcg by mouth daily.    Marland Kitchen ONE-DAILY MULTI VITAMINS PO TABS Oral Take 1 tablet by mouth daily.      Marland Kitchen PALIPERIDONE ER 3 MG PO TB24 Oral Take 3 mg by mouth at bedtime.    Marland Kitchen POLYETHYLENE GLYCOL 3350 PO PACK Oral Take 17 g by mouth 2 (two) times daily.    . SENNA-DOCUSATE SODIUM 8.6-50 MG PO TABS Oral Take 2 tablets by mouth at bedtime.    . TRAZODONE HCL 50 MG PO TABS Oral Take 50 mg by mouth at bedtime.      BP  129/70  Pulse 100  Temp 98.1 F (36.7 C) (Oral)  Resp 30  SpO2 98%  Physical Exam  Nursing note and vitals reviewed. Constitutional: She appears well-developed and well-nourished. No distress.  HENT:  Head: Normocephalic and atraumatic.  Right Ear: External ear normal.  Left Ear: External ear normal.  Eyes: Conjunctivae are normal. Right eye exhibits no discharge. Left eye exhibits no discharge. No scleral icterus.  Neck: Neck supple. No tracheal deviation present.  Cardiovascular: Regular rhythm and intact distal pulses.  Tachycardia present.   Pulmonary/Chest: Effort normal and breath sounds normal. No stridor. No respiratory distress. She has no wheezes. She has no rales.  Abdominal: Soft. Bowel sounds are normal. She exhibits no distension. There is no tenderness. There is no rebound and no guarding.  Musculoskeletal: She exhibits no edema and no tenderness.  Neurological: She is alert. She has normal strength. No sensory deficit. Cranial nerve deficit:  no gross defecits noted. She exhibits normal muscle tone. She displays no seizure activity. Coordination normal.  Skin: Skin is warm and dry. No rash noted.       Ecchymoses noted on forearms, thin skin'  Psychiatric: She has a normal mood and affect.    ED Course  Procedures (including critical care time) EKG Rate 99, left axis SINUS RHYTHM ~ normal P axis, V-rate 50- 99 LEFT ANTERIOR FASCICULAR BLOCK ~ axis(240,-40), init forces inf CONSIDER RIGHT VENTRICULAR HYPERTROPHY ~ large R or R' V1/V2 ABNRM R PROG, CONSIDER ASMI OR LEAD PLACEMENT ~ Q >66mS, diminished R, V2 Labs Reviewed  COMPREHENSIVE METABOLIC PANEL - Abnormal; Notable for the following:    Glucose, Bld 119 (*)     BUN 26 (*)     Albumin 3.3 (*)     Total Bilirubin 0.2 (*)     GFR calc non Af Amer 57 (*)     GFR calc Af Amer 66 (*)     All other components within normal limits  CBC WITH DIFFERENTIAL - Abnormal; Notable for the following:    Hemoglobin 7.3  (*)     HCT 27.2 (*)     MCV 62.0 (*)     MCH 16.6 (*)     MCHC 26.8 (*)     RDW 20.2 (*)     All other components within normal limits  APTT  PROTIME-INR  TYPE AND SCREEN  ABO/RH  OCCULT BLOOD, POC DEVICE  VITAMIN B12  FOLATE  IRON AND TIBC  FERRITIN  RETICULOCYTES   Dg Chest Portable 1 View  09/06/2011  *RADIOLOGY REPORT*  Clinical Data: Weakness and shortness of breath.  PORTABLE CHEST - 1 VIEW  Comparison: Chest x-ray 09/11/2010.  Findings: Lung volumes are normal.  No acute consolidative airspace disease.  No definite pleural effusions.  Pulmonary vasculature is normal.  Heart size is normal. The patient is rotated to the right on today's exam, resulting in distortion of the mediastinal contours and reduced diagnostic sensitivity and specificity for mediastinal pathology.  Surgical clips at the level of the thoracic inlet, likely from prior thyroidectomy.  Atherosclerosis in the thoracic aorta.  Retrocardiac density, likely represents a hiatal hernia (similar to priors).  IMPRESSION: 1.  No radiographic evidence of acute cardiopulmonary disease. 2.  Hiatal hernia again noted. 3.  Atherosclerosis.   Original Report Authenticated By: Florencia Reasons, M.D.       MDM  Patient was sent from her nursing facility for worsening anemia. Hemoglobin of 7.3 down from a previous level of 9. Rectal exam does not show any evidence of an acute GI bleed at this time. However she does have low MCV consistent with an iron deficiency anemia. Patient was sent for IV placement and blood transfusion. An anemia was sent off for further analysis. Patient admitted to hospital for further evaluation and probable blood transfusion.        Celene Kras, MD 09/06/11 1308  Celene Kras, MD 09/06/11 [id number]

## 2011-09-06 NOTE — H&P (Signed)
[facility] History and Physical  Myda Detwiler YNW:[id number] DOB: Dec 11, 1934 DOA: 09/06/2011  Referring physician: ER physician PCP: Kimber Relic, MD   Chief Complaint: anemia  HPI:  77 year old female with history of HTN, dementia, hypothyroidism who was sent from nursing home for evaluation of anemia. Patient is not a very good historian due to history of dementia and is not quite sure why she was sent over but she denies lightheadedness, dizziness, loss of consciousness. No complaints of chest pain, no abdominal pain, no nausea or vomiting. No reports of blood in stool or urine.  Principal Problem:  *Acute blood loss anemia - unclear etiology - FOBT negative in ED - per EPIC review, patient has had EGD 03/2011 with findings of small zenker's diverticulum and hiatal hernia - will continue protonix - transfusion of 2 units PRBC's started in ED - follow up post transfusion CBC  Active Problems:  Essential hypertension, benign - restart Norvasc   Depressive disorder, not elsewhere classified - continue cymbalta   Unspecified hypothyroidism - continue levothyroxine  Dyslipidemia - continue atorvastatin  Code Status: Full Family Communication: Pt at bedside Disposition Plan: Admit for further evaluation to med-surg floor  Manson Passey, MD  [facility] Pager [phone]  If 7PM-7AM, please contact night-coverage [email or web address] Password [email or web address] 09/06/2011, 10:06 PM  Review of Systems:  Constitutional: Negative for fever, chills and malaise/fatigue. Negative for diaphoresis.  HENT: Negative for hearing loss, ear pain, nosebleeds, congestion, sore throat, neck pain, tinnitus and ear discharge.   Eyes: Negative for blurred vision, double vision, photophobia, pain, discharge and redness.  Respiratory: Negative for cough, hemoptysis, sputum production, shortness of breath, wheezing and stridor.   Cardiovascular: Negative for chest pain, palpitations,  orthopnea, claudication and leg swelling.  Gastrointestinal: Negative for nausea, vomiting, positive for intermittent  abdominal pain. Negative for heartburn, constipation, blood in stool and melena.  Genitourinary: Negative for dysuria, urgency, frequency, hematuria and flank pain.  Musculoskeletal: Negative for myalgias, back pain, joint pain and falls.  Skin: Negative for itching and rash.  Neurological: Negative for dizziness and weakness. Negative for tingling, tremors, sensory change, speech change, focal weakness, loss of consciousness and headaches.  Endo/Heme/Allergies: Negative for environmental allergies and polydipsia. Does not bruise/bleed easily.  Psychiatric/Behavioral: Negative for suicidal ideas. The patient is not nervous/anxious.      Past Medical History  Diagnosis Date  . Thyroid cancer   . Ulcer     chronic  . Depression   . Vitamin d deficiency   . Anxiety   . Malaise and fatigue   . Osteoarthritis   . Kidney disease   . Pneumonia   . Dementia   . Alzheimer disease   . Hypothyroidism   . Bipolar 1 disorder   . Dehydration   . Hypertension   . Diabetes mellitus    Past Surgical History  Procedure Date  . Esophagogastroduodenoscopy 02/23/2011    Procedure: ESOPHAGOGASTRODUODENOSCOPY (EGD);  Surgeon: Theda Belfast, MD;  Location[facility] ENDOSCOPY;  Service: Endoscopy;  Laterality: N/A;   Social History:  reports that she has quit smoking. Her smoking use included Cigarettes. She does not have any smokeless tobacco history on file. She reports that she does not drink alcohol. Her drug history not on file.  Allergies  Allergen Reactions  . Lithium   . Penicillins   . Sulfa Antibiotics     Family History: heart disease in parents  Prior to Admission medications   Medication Sig Start Date End Date Taking?  Authorizing Provider  ALPRAZolam (XANAX) 0.25 MG tablet Take 0.25 mg by mouth 3 (three) times daily as needed. anxiety   Yes Historical Provider, MD    aMILoride (MIDAMOR) 5 MG tablet Take 1 tablet (5 mg total) by mouth daily. 02/24/11 02/24/12 Yes Estela Isaiah Blakes, MD  amLODipine (NORVASC) 5 MG tablet Take 5 mg by mouth daily.   Yes Historical Provider, MD  atorvastatin (LIPITOR) 10 MG tablet Take 10 mg by mouth at bedtime.    Yes Historical Provider, MD  calcium-vitamin D (OSCAL WITH D) 500-200 MG-UNIT per tablet Take 2 tablets by mouth daily.    Yes Historical Provider, MD  cholecalciferol (VITAMIN D) 1000 UNITS tablet Take 1,000 Units by mouth daily.   Yes Historical Provider, MD  clobetasol (TEMOVATE) 0.05 % external solution Apply 1 application topically 2 (two) times daily.   Yes Historical Provider, MD  desmopressin (DDAVP) 0.1 MG tablet Take 0.1 mg by mouth every evening.   Yes Historical Provider, MD  desmopressin (DDAVP) 0.2 MG tablet Take 0.2 mg by mouth 2 (two) times daily.   Yes Historical Provider, MD  diclofenac sodium (VOLTAREN) 1 % GEL Apply 1 application topically 4 (four) times daily. Apply 4 grams to left shoulder and left knee    Yes Historical Provider, MD  DULoxetine (CYMBALTA) 30 MG capsule Take 30 mg by mouth daily.   Yes Historical Provider, MD  HYDROcodone-acetaminophen (VICODIN) 5-500 MG per tablet Take 1 tablet by mouth every 6 (six) hours as needed. Pain    Yes Historical Provider, MD  hydrOXYzine (ATARAX/VISTARIL) 25 MG tablet Take 25 mg by mouth 3 (three) times daily as needed. anxiety   Yes Historical Provider, MD  lansoprazole (PREVACID) 30 MG capsule Take 30 mg by mouth 2 (two) times daily.   Yes Historical Provider, MD  levothyroxine (SYNTHROID, LEVOTHROID) 125 MCG tablet Take 125 mcg by mouth daily.   Yes Historical Provider, MD  Multiple Vitamin (MULTIVITAMIN) tablet Take 1 tablet by mouth daily.     Yes Historical Provider, MD  paliperidone (INVEGA) 3 MG 24 hr tablet Take 3 mg by mouth at bedtime. 02/24/11 03/25/12 Yes Estela Isaiah Blakes, MD  polyethylene glycol [facility]) packet Take  17 g by mouth 2 (two) times daily.   Yes Historical Provider, MD  sennosides-docusate sodium (SENOKOT-S) 8.6-50 MG tablet Take 2 tablets by mouth at bedtime.   Yes Historical Provider, MD  traZODone (DESYREL) 50 MG tablet Take 50 mg by mouth at bedtime.   Yes Historical Provider, MD   Physical Exam: Filed Vitals:   09/06/11 1803 09/06/11 2045  BP: 129/70   Pulse: 100 105  Temp: 98.1 F (36.7 C)   TempSrc: Oral   Resp: 30 31  SpO2: 98% 99%    Physical Exam  Constitutional: Appears well-developed and well-nourished. No distress.  HENT: Normocephalic. No tonsillar erythema or exudates Eyes: Conjunctivae and EOM are normal. PERRLA, no scleral icterus.  Neck: Normal ROM. Neck supple. No JVD. No tracheal deviation. No thyromegaly.  CVS: RRR, S1/S2 +, no murmurs, no gallops, no carotid bruit.  Pulmonary: Effort and breath sounds normal, no stridor, rhonchi, wheezes, rales.  Abdominal: Soft. BS +,  no distension, tenderness, rebound or guarding.  Musculoskeletal: Normal range of motion. trace edema and no tenderness.  Lymphadenopathy: No lymphadenopathy noted, cervical, inguinal. Neuro: Alert. Normal reflexes, muscle tone coordination. No cranial nerve deficit. Skin: Skin is warm and dry. No rash noted. Not diaphoretic. No erythema. No pallor.  Psychiatric:  Normal mood and affect. Behavior, judgment, thought content normal.   Labs on Admission:  Basic Metabolic Panel:  Lab 09/06/11 7829  NA 142  K 4.4  CL 107  CO2 25  GLUCOSE 119*  BUN 26*  CREATININE 0.95  CALCIUM 9.2  MG --  PHOS --   Liver Function Tests:  Lab 09/06/11 1959  AST 15  ALT 9  ALKPHOS 89  BILITOT 0.2*  PROT 7.1  ALBUMIN 3.3*   CBC:  Lab 09/06/11 1959  WBC 7.1  NEUTROABS 4.9  HGB 7.3*  HCT 27.2*  MCV 62.0*  PLT 320   Radiological Exams on Admission: Dg Chest Portable 1 View 09/06/2011    IMPRESSION: 1.  No radiographic evidence of acute cardiopulmonary disease. 2.  Hiatal hernia again noted. 3.   Atherosclerosis.       EKG: Not done on admission

## 2011-09-06 NOTE — ED Notes (Signed)
Attempt to call report to 5E unsuccessful. Receiving RN will return call to ED when available.

## 2011-09-06 NOTE — ED Notes (Signed)
[facility] states that [facility] sent patient here because they think she's anemic- PTAR does not have paperwork for recent labs and they are unsure of if/when the labwork was done.

## 2011-09-07 DIAGNOSIS — Z862 Personal history of diseases of the blood and blood-forming organs and certain disorders involving the immune mechanism: Secondary | ICD-10-CM

## 2011-09-07 DIAGNOSIS — D649 Anemia, unspecified: Secondary | ICD-10-CM

## 2011-09-07 DIAGNOSIS — E232 Diabetes insipidus: Secondary | ICD-10-CM

## 2011-09-07 LAB — HEMOGLOBIN AND HEMATOCRIT, BLOOD: Hemoglobin: 8.8 g/dL — ABNORMAL LOW (ref 12.0–15.0)

## 2011-09-07 LAB — CBC
HCT: 28.7 % — ABNORMAL LOW (ref 36.0–46.0)
Hemoglobin: 9.1 g/dL — ABNORMAL LOW (ref 12.0–15.0)
MCH: 16.8 pg — ABNORMAL LOW (ref 26.0–34.0)
MCH: 18.8 pg — ABNORMAL LOW (ref 26.0–34.0)
MCHC: 27.2 g/dL — ABNORMAL LOW (ref 30.0–36.0)
MCHC: 28.2 g/dL — ABNORMAL LOW (ref 30.0–36.0)
MCV: 62 fL — ABNORMAL LOW (ref 78.0–100.0)
MCV: 66.7 fL — ABNORMAL LOW (ref 78.0–100.0)
RDW: 20.5 % — ABNORMAL HIGH (ref 11.5–15.5)

## 2011-09-07 LAB — COMPREHENSIVE METABOLIC PANEL
ALT: 9 U/L (ref 0–35)
ALT: 8 U/L (ref 0–35)
AST: 18 U/L (ref 0–37)
AST: 17 U/L (ref 0–37)
Albumin: 3.1 g/dL — ABNORMAL LOW (ref 3.5–5.2)
Alkaline Phosphatase: 90 U/L (ref 39–117)
CO2: 25 mEq/L (ref 19–32)
CO2: 25 mEq/L (ref 19–32)
Calcium: 8.7 mg/dL (ref 8.4–10.5)
Chloride: 107 mEq/L (ref 96–112)
Chloride: 113 mEq/L — ABNORMAL HIGH (ref 96–112)
Creatinine, Ser: 0.9 mg/dL (ref 0.50–1.10)
GFR calc non Af Amer: 61 mL/min — ABNORMAL LOW (ref >90)
GFR calc non Af Amer: 64 mL/min — ABNORMAL LOW (ref >90)
Potassium: 3.9 mEq/L (ref 3.5–5.1)
Sodium: 146 mEq/L — ABNORMAL HIGH (ref 135–145)
Total Bilirubin: 0.2 mg/dL — ABNORMAL LOW (ref 0.3–1.2)
Total Bilirubin: 0.4 mg/dL (ref 0.3–1.2)

## 2011-09-07 LAB — MRSA PCR SCREENING: MRSA by PCR: NEGATIVE

## 2011-09-07 LAB — DIFFERENTIAL
Basophils Absolute: 0 10*3/uL (ref 0.0–0.1)
Basophils Relative: 0 % (ref 0–1)
Eosinophils Absolute: 0.2 10*3/uL (ref 0.0–0.7)
Lymphs Abs: 1.5 10*3/uL (ref 0.7–4.0)
Neutro Abs: 5 10*3/uL (ref 1.7–7.7)

## 2011-09-07 LAB — IRON AND TIBC
Saturation Ratios: 4 % — ABNORMAL LOW (ref 20–55)
UIBC: 440 ug/dL — ABNORMAL HIGH (ref 125–400)

## 2011-09-07 LAB — PROTIME-INR: INR: 0.97 (ref 0.00–1.49)

## 2011-09-07 LAB — TSH: TSH: 0.159 u[IU]/mL — ABNORMAL LOW (ref 0.350–4.500)

## 2011-09-07 LAB — GLUCOSE, CAPILLARY: Glucose-Capillary: 101 mg/dL — ABNORMAL HIGH (ref 70–99)

## 2011-09-07 LAB — APTT: aPTT: 26 seconds (ref 24–37)

## 2011-09-07 MED ORDER — FERROUS SULFATE 325 (65 FE) MG PO TABS
325.0000 mg | ORAL_TABLET | Freq: Three times a day (TID) | ORAL | Status: DC
  Administered 2011-09-08 – 2011-09-09 (×5): 325 mg via ORAL
  Filled 2011-09-07 (×7): qty 1

## 2011-09-07 MED ORDER — IRON DEXTRAN 50 MG/ML IJ SOLN
1200.0000 mg | Freq: Once | INTRAMUSCULAR | Status: AC
  Administered 2011-09-07: 1200 mg via INTRAVENOUS
  Filled 2011-09-07: qty 24

## 2011-09-07 MED ORDER — CHLORHEXIDINE GLUCONATE CLOTH 2 % EX PADS: 6.0000 | MEDICATED_PAD | Freq: Every day | CUTANEOUS | Status: DC

## 2011-09-07 MED ORDER — ENSURE COMPLETE PO LIQD
237.0000 mL | Freq: Three times a day (TID) | ORAL | Status: DC
  Administered 2011-09-07 – 2011-09-08 (×4): 237 mL via ORAL
  Filled 2011-09-07: qty 237

## 2011-09-07 MED ORDER — MUPIROCIN 2 % EX OINT: 1.0000 "application " | TOPICAL_OINTMENT | Freq: Two times a day (BID) | CUTANEOUS | Status: DC

## 2011-09-07 MED ORDER — SODIUM CHLORIDE 0.9 % IV SOLN
25.0000 mg | Freq: Once | INTRAVENOUS | Status: AC
  Administered 2011-09-07: 25 mg via INTRAVENOUS
  Filled 2011-09-07: qty 0.5

## 2011-09-07 NOTE — Progress Notes (Signed)
[facility]                                                                                Patient Demographics  [name], is a 77 y.o. female  WNU:[id number]  IHK:[id number]  DOB - 22-Aug-1934  Admit date - 09/06/2011  Admitting Physician Alison Murray, MD  Outpatient Primary MD for the patient is GREEN, Lenon Curt, MD  LOS - 1   Chief Complaint  Patient presents with  . Anemia        Assessment & Plan    1. History of iron deficiency anemia - admitted for packed RBC transfusion from nursing home, has history of upper GI bleed in the past, is on PPI, anemia panel suggested and deficiency, she status post 1-1/2 unit of packed RBC transfusion and currently H&H is stable. He will get IV iron to replete her iron stores we will monitor H&H closely.   2. 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   3.H/O Essential hypertension, benign -  Dementia - no acute issues home medications will be continued, aspiration precautions fall precautions and feeding assistance.   4. History of hypothyroidism patient on Synthroid 125 mcg daily, TSH noted to be slightly low, will repeat TSH with free T3 and free T4 and adjust Synthroid dose as needed.     Code Status: Full  Family Communication: D/W patient  Disposition Plan: SNF    Procedures none   Consults  none   Time Spent in minutes   35   Antibiotics     Anti-infectives    None      Scheduled Meds:   . aMILoride  5 mg Oral Daily  . amLODipine  5 mg Oral Daily  . atorvastatin  10 mg Oral QHS  . calcium-vitamin D  2 tablet Oral Daily  . cholecalciferol  1,000 Units Oral Daily  . clobetasol cream   Topical BID  . desmopressin  0.1 mg Oral QPM  . desmopressin  0.2 mg Oral BID  . dextrose 5 % and 0.45% NaCl   Intravenous STAT  . diclofenac sodium  1 application Topical QID  . DULoxetine   30 mg Oral Daily  . levothyroxine  125 mcg Oral Daily  . multivitamin with minerals  1 tablet Oral Daily  . paliperidone  3 mg Oral QHS  . pantoprazole  40 mg Oral Daily  . polyethylene glycol  17 g Oral BID  . senna-docusate  2 tablet Oral QHS  . traZODone  50 mg Oral QHS  . DISCONTD: Chlorhexidine Gluconate Cloth  6 each Topical Q0600  . DISCONTD: clobetasol  1 application Topical BID  . DISCONTD: multivitamin  1 tablet Oral Daily  . DISCONTD: mupirocin ointment  1 application Nasal BID  . DISCONTD: sennosides-docusate sodium  2 tablet Oral QHS   Continuous Infusions:   . DISCONTD: sodium chloride 1,000 mL (09/06/11 2122)  . DISCONTD: sodium chloride     PRN Meds:.acetaminophen, acetaminophen, ALPRAZolam, HYDROcodone-acetaminophen, hydrOXYzine, morphine injection, ondansetron (ZOFRAN) IV, ondansetron (ZOFRAN) IV, ondansetron, DISCONTD: ondansetron (ZOFRAN)  IV  DVT Prophylaxis   SCDs     Leroy Sea M.D on 09/07/2011 at 10:45 AM  Between 7am to 7pm - Pager - [phone]  After 7pm go to [email or web address] - password [email or web address]  And look for the night coverage person covering for me after hours  [facility] Office  [phone]    Subjective:   Chakia Counts today has, No headache, No chest pain, No abdominal pain - No Nausea, No new weakness tingling or numbness, No Cough - SOB.    Objective:   Filed Vitals:   09/07/11 0345 09/07/11 0445 09/07/11 0545 09/07/11 0625  BP: 111/71 116/73 131/78 146/78  Pulse: 82 84 74 78  Temp: 98.1 F (36.7 C) 98.1 F (36.7 C) 97.7 F (36.5 C) 97.7 F (36.5 C)  TempSrc: Oral Oral Oral Oral  Resp: 18 18 18 18   Height:      Weight:      SpO2:        Wt Readings from Last 3 Encounters:  09/06/11 68.3 kg (150 lb 9.2 oz)  02/21/11 60.374 kg (133 lb 1.6 oz)  02/21/11 60.374 kg (133 lb 1.6 oz)     Intake/Output Summary (Last 24 hours) at 09/07/11 1045 Last data filed at 09/07/11 0900  Gross per 24 hour  Intake   2120  ml  Output      0 ml  Net   2120 ml    Exam Awake but not alert, No new F.N deficits, Normal affect [place].AT,PERRAL Supple Neck,No JVD, No cervical lymphadenopathy appriciated.  Symmetrical Chest wall movement, Good air movement bilaterally, CTAB RRR,No Gallops,Rubs or new Murmurs, No Parasternal Heave +ve B.Sounds, Abd Soft, Non tender, No organomegaly appriciated, No rebound - guarding or rigidity. No Cyanosis, Clubbing or edema, No new Rash or bruise     Data Review   Micro Results No results found for this or any previous visit (from the past 240 hour(s)).  Radiology Reports Dg Chest Portable 1 View  09/06/2011  *RADIOLOGY REPORT*  Clinical Data: Weakness and shortness of breath.  PORTABLE CHEST - 1 VIEW  Comparison: Chest x-ray 09/11/2010.  Findings: Lung volumes are normal.  No acute consolidative airspace disease.  No definite pleural effusions.  Pulmonary vasculature is normal.  Heart size is normal. The patient is rotated to the right on today's exam, resulting in distortion of the mediastinal contours and reduced diagnostic sensitivity and specificity for mediastinal pathology.  Surgical clips at the level of the thoracic inlet, likely from prior thyroidectomy.  Atherosclerosis in the thoracic aorta.  Retrocardiac density, likely represents a hiatal hernia (similar to priors).  IMPRESSION: 1.  No radiographic evidence of acute cardiopulmonary disease. 2.  Hiatal hernia again noted. 3.  Atherosclerosis.   Original Report Authenticated By: Florencia Reasons, M.D.     CBC  Lab 09/07/11 0825 09/07/11 09/06/11 1959  WBC 5.7 7.4 7.1  HGB 9.1* 7.8* 7.3*  HCT 32.3* 28.7* 27.2*  PLT PLATELET CLUMPS NOTED ON SMEAR, COUNT APPEARS ADEQUATE 291 320  MCV 66.7* 62.0* 62.0*  MCH 18.8* 16.8* 16.6*  MCHC 28.2* 27.2* 26.8*  RDW 23.1* 20.5* 20.2*  LYMPHSABS -- 1.5 1.4  MONOABS -- 0.7 0.6  EOSABS -- 0.2 0.2  BASOSABS -- 0.0 0.0  BANDABS -- -- --    Chemistries   Lab 09/07/11 0825  09/07/11 09/06/11 1959  NA 146* 142 142  K 4.2 3.9 4.4  CL 113* 107 107  CO2 25 25 25   GLUCOSE 101* 121*  119*  BUN 19 22 26*  CREATININE 0.86 0.90 0.95  CALCIUM 8.7 9.1 9.2  MG -- 2.2 --  AST 17 18 15   ALT 8 9 9   ALKPHOS 84 90 89  BILITOT 0.4 0.2* 0.2*   ------------------------------------------------------------------------------------------------------------------ estimated creatinine clearance is 53.6 ml/min (by C-G formula based on Cr of 0.86). ------------------------------------------------------------------------------------------------------------------ No results found for this basename: HGBA1C:2 in the last 72 hours ------------------------------------------------------------------------------------------------------------------ No results found for this basename: CHOL:2,HDL:2,LDLCALC:2,TRIG:2,CHOLHDL:2,LDLDIRECT:2 in the last 72 hours ------------------------------------------------------------------------------------------------------------------  [facility] 09/07/11  TSH 0.287*  T4TOTAL --  T3FREE --  THYROIDAB --   ------------------------------------------------------------------------------------------------------------------  [facility] 09/06/11 1955  VITAMINB12 440  FOLATE >20.0  FERRITIN 6*  TIBC 456  IRON 16*  RETICCTPCT 1.4    Coagulation profile  Lab 09/07/11 0848 09/07/11 09/06/11 1959  INR 0.97 0.93 0.95  PROTIME -- -- --    No results found for this basename: DDIMER:2 in the last 72 hours  Cardiac Enzymes No results found for this basename: CK:3,CKMB:3,TROPONINI:3,MYOGLOBIN:3 in the last 168 hours ------------------------------------------------------------------------------------------------------------------ No components found with this basename: POCBNP:3

## 2011-09-07 NOTE — Progress Notes (Signed)
Patient two assisted to bed after sitting in chair attempting to stand alone and pulled IV out.  Patient confused asking same question multiple times.  She is aware of who she is but otherwise is not oriented.  Attempted to start IV x 2 unsuccessful, IV team paged for restart

## 2011-09-07 NOTE — Progress Notes (Signed)
Clinical Social Work Department BRIEF PSYCHOSOCIAL ASSESSMENT 09/07/2011  Patient:  [name], [name]     Account Number:  [id number]     Admit date:  09/06/2011  Clinical Social Worker:  Skip Mayer  Date/Time:  09/07/2011 02:45 PM  Referred by:  RN  Date Referred:  09/07/2011 Referred for  SNF Placement   Other Referral:   Interview type:  Family Other interview type:    PSYCHOSOCIAL DATA Living Status:  FACILITY Admitted from facility:  [facility] Level of care:  Skilled Nursing Facility Primary support name:  [name]/Dtr/POA Primary support relationship to patient:  CHILD, ADULT Degree of support available:   Adequate    CURRENT CONCERNS Current Concerns  Post-Acute Placement   Other Concerns:    SOCIAL WORK ASSESSMENT / PLAN CSW spoke with pt's dtr/POA, [name], re: d/c plan--pt oriented to person only.  Pt admitted from [facility] and is able to return at d/c, per Tammy, admissions coordinator.    Will continue to follow and assist with SNF placement at d/c.   Assessment/plan status:  Information/Referral to [facility] Other assessment/ plan:   Information/referral to community resources:   SNF  [facility]    PATIENT'S/FAMILY'S RESPONSE TO PLAN OF CARE: Pt's dtr/POA reports pt with positive experience for past four years at [facility] and plan is for return.  Pt's dtr verbalized understanding of d/c plan and appreciation for CSW assist.    Dellie Burns, MSW, LCSWA [phone] (coverage)

## 2011-09-07 NOTE — Evaluation (Signed)
Physical Therapy Evaluation Patient Details Name: [name] MRN: [id number] DOB: 19-Sep-1934 Today's Date: 09/07/2011 Time: 7829-5621 PT Time Calculation (min): 29 min  PT Assessment / Plan / Recommendation Clinical Impression  pt adm with ABLA of unclear etiology; will benefit from PT to maximize safety and independence    PT Assessment  Patient needs continued PT services    Follow Up Recommendations  Skilled nursing facility    Barriers to Discharge        Equipment Recommendations  Defer to next venue    Recommendations for Other Services     Frequency Min 3X/week    Precautions / Restrictions Precautions Precautions: Fall   Pertinent Vitals/Pain       Mobility  Bed Mobility Bed Mobility: Supine to Sit Supine to Sit: 3: Mod assist Details for Bed Mobility Assistance: cues for technique and participation Transfers Transfers: Sit to Stand;Stand to Sit Sit to Stand: 4: Min assist;From bed;3: Mod assist Stand to Sit: 4: Min assist;To chair/3-in-1;3: Mod assist Details for Transfer Assistance: cues for hand placement and safety Ambulation/Gait Ambulation/Gait Assistance: 3: Mod assist Ambulation Distance (Feet): 6 Feet Assistive device: Rolling walker Ambulation/Gait Assistance Details: multi modal cues for RW use and safety Gait Pattern: Step-to pattern    Exercises     PT Diagnosis: Difficulty walking  PT Problem List: Decreased activity tolerance;Decreased balance;Decreased mobility;Decreased safety awareness;Decreased knowledge of use of DME PT Treatment Interventions: DME instruction;Gait training;Functional mobility training;Therapeutic activities;Therapeutic exercise;Balance training;Patient/family education   PT Goals Acute Rehab PT Goals PT Goal Formulation: With patient/family Time For Goal Achievement: 09/20/11 Potential to Achieve Goals: Good Pt will go Supine/Side to Sit: with supervision PT Goal: Supine/Side to Sit - Progress: Goal set  today Pt will go Sit to Stand: with supervision PT Goal: Sit to Stand - Progress: Goal set today Pt will Ambulate: 51 - 150 feet;with supervision;with least restrictive assistive device PT Goal: Ambulate - Progress: Goal set today  Visit Information  Last PT Received On: 09/07/11    Subjective Data  Subjective: I am wet Patient Stated Goal: unable to state   Prior Functioning  Home Living Lives With: Other (Comment) (from SNF) Available Help at Discharge: Skilled Nursing Facility Prior Function Level of Independence: Needs assistance Needs Assistance: Bathing;Dressing;Transfers;Gait    Cognition  Overall Cognitive Status: Appears within functional limits for tasks assessed/performed Arousal/Alertness: Awake/alert Orientation Level: Appears intact for tasks assessed Behavior During Session: [facility] for tasks performed    Extremity/Trunk Assessment Right Lower Extremity Assessment RLE ROM/Strength/Tone: [facility] for tasks assessed Left Lower Extremity Assessment LLE ROM/Strength/Tone: [facility] for tasks assessed   Balance Static Standing Balance Static Standing - Balance Support: During functional activity;Bilateral upper extremity supported Static Standing - Level of Assistance: 3: Mod assist  End of Session PT - End of Session Activity Tolerance: Patient limited by fatigue Patient left: in chair;with call bell/phone within reach;with family/visitor present;with nursing in room Nurse Communication: Mobility status  GP     [facility] 09/07/2011, 1:12 PM

## 2011-09-07 NOTE — Progress Notes (Signed)
INITIAL ADULT NUTRITION ASSESSMENT Date: 09/07/2011   Time: 12:57 PM Reason for Assessment: Nutrition risk   INTERVENTION: Ensure Complete TID. Downgraded diet to mechanical soft. Will monitor.   ASSESSMENT: Female 77 y.o.  Dx: History of iron deficiency anemia  Food/Nutrition Related Hx: Pt with dementia, unable to provide history. Called [facility] to obtain nutrition history. RN reports pt has not been the best eater and has been eating "so-so" at the facility, like 50% of meals, sometimes less, however has been getting Two Cal nutrition supplement which contains 475 calories, 20g protein per serving three times/day. Pt's intake of nutrition supplements likely preventing weight loss. RN reports pt's weight is up 12 pounds since the beginning of the year. RN states pt was on a mechanical soft diet.   Hx:  Past Medical History  Diagnosis Date  . Thyroid cancer   . Ulcer     chronic  . Depression   . Vitamin d deficiency   . Anxiety   . Malaise and fatigue   . Osteoarthritis   . Kidney disease   . Pneumonia   . Dementia   . Alzheimer disease   . Hypothyroidism   . Bipolar 1 disorder   . Dehydration   . Hypertension   . Diabetes mellitus    Related Meds:  Scheduled Meds:   . aMILoride  5 mg Oral Daily  . amLODipine  5 mg Oral Daily  . atorvastatin  10 mg Oral QHS  . calcium-vitamin D  2 tablet Oral Daily  . cholecalciferol  1,000 Units Oral Daily  . clobetasol cream   Topical BID  . desmopressin  0.1 mg Oral QPM  . desmopressin  0.2 mg Oral BID  . dextrose 5 % and 0.45% NaCl   Intravenous STAT  . diclofenac sodium  1 application Topical QID  . DULoxetine  30 mg Oral Daily  . ferrous sulfate  325 mg Oral TID WC  . iron dextran (INFED/DEXFERRUM) infusion  25 mg Intravenous Once   Followed by  . iron dextran (INFED/DEXFERRUM) infusion  1,200 mg Intravenous Once  . levothyroxine  125 mcg Oral Daily  . multivitamin with minerals  1 tablet Oral Daily  . paliperidone   3 mg Oral QHS  . pantoprazole  40 mg Oral Daily  . polyethylene glycol  17 g Oral BID  . senna-docusate  2 tablet Oral QHS  . traZODone  50 mg Oral QHS  . DISCONTD: Chlorhexidine Gluconate Cloth  6 each Topical Q0600  . DISCONTD: clobetasol  1 application Topical BID  . DISCONTD: multivitamin  1 tablet Oral Daily  . DISCONTD: mupirocin ointment  1 application Nasal BID  . DISCONTD: sennosides-docusate sodium  2 tablet Oral QHS   Continuous Infusions:   . DISCONTD: sodium chloride 1,000 mL (09/06/11 2122)  . DISCONTD: sodium chloride     PRN Meds:.acetaminophen, acetaminophen, ALPRAZolam, HYDROcodone-acetaminophen, hydrOXYzine, morphine injection, ondansetron (ZOFRAN) IV, ondansetron (ZOFRAN) IV, ondansetron, DISCONTD: ondansetron (ZOFRAN) IV  Ht: 5\' 4"  (162.6 cm)  Wt: 150 lb 9.2 oz (68.3 kg)  Ideal Wt: 120 lb % Ideal Wt: 125  Usual Wt: 149 lb per [facility] RN % Usual Wt: 100  Body mass index is 25.85 kg/(m^2).   Labs:  CMP     Component Value Date/Time   NA 146* 09/07/2011 0825   K 4.2 09/07/2011 0825   CL 113* 09/07/2011 0825   CO2 25 09/07/2011 0825   GLUCOSE 101* 09/07/2011 0825   BUN 19 09/07/2011  0825   CREATININE 0.86 09/07/2011 0825   CALCIUM 8.7 09/07/2011 0825   CALCIUM 9.7 05/11/2008 0533   PROT 6.8 09/07/2011 0825   ALBUMIN 3.1* 09/07/2011 0825   AST 17 09/07/2011 0825   ALT 8 09/07/2011 0825   ALKPHOS 84 09/07/2011 0825   BILITOT 0.4 09/07/2011 0825   GFRNONAA 64* 09/07/2011 0825   GFRAA 75* 09/07/2011 0825    Intake/Output Summary (Last 24 hours) at 09/07/11 1301 Last data filed at 09/07/11 0900  Gross per 24 hour  Intake   2120 ml  Output      0 ml  Net   2120 ml   Last BM - PTA  Diet Order: General  IVF:    DISCONTD: sodium chloride Last Rate: 1,000 mL (09/06/11 2122)  DISCONTD: sodium chloride     Estimated Nutritional Needs:   Kcal:1500-1700 Protein:70-80g Fluid:1.5-1.7L  NUTRITION DIAGNOSIS: -Predicted suboptimal energy intake (NI-1.6).   Status: Ongoing  RELATED TO: likely not eating excellent PTA  AS EVIDENCE BY: [facility] RN statement  MONITORING/EVALUATION(Goals): Pt to consume >75% of meals/supplements.   EDUCATION NEEDS: -Education not appropriate at this time   Dietitian #: [phone]  DOCUMENTATION CODES Per approved criteria  -Not Applicable    Marshall Cork 09/07/2011, 12:57 PM

## 2011-09-07 NOTE — Progress Notes (Addendum)
Pharmacy: IV Iron  77 yo F with history of Iron Deficiency Anemia to receive IV iron per pharmacy.     Hemoglobin on admission 7.3, now improved to 9.1 after PRBC infusion.    Iron/Anemia panel shows iron deficiency   Weight 86 kg; CrCl 53 ml/min   Plan 1.) Iron Dextran test dose 25 mg x 1 over 5 minutes. 2.) If no reaction to test dose after 1 hour, infuse IV Iron Dextran 1200 mg x 1 dose 3.) Ferrous sulfate 325 mg po TID with meals 4.) Senakot-S 2 tablets PO QHS   *Pharmacy will sign off, please re-consult if needed, thank you!  BorgerdingLoma Messing PharmD Pager #: [phone] 11:27 AM 09/07/2011

## 2011-09-08 LAB — TYPE AND SCREEN
ABO/RH(D): A POS
Antibody Screen: NEGATIVE
Unit division: 0

## 2011-09-08 LAB — BASIC METABOLIC PANEL
CO2: 23 mEq/L (ref 19–32)
Chloride: 116 mEq/L — ABNORMAL HIGH (ref 96–112)
GFR calc non Af Amer: 57 mL/min — ABNORMAL LOW (ref >90)
Glucose, Bld: 84 mg/dL (ref 70–99)
Potassium: 4.5 mEq/L (ref 3.5–5.1)
Sodium: 147 mEq/L — ABNORMAL HIGH (ref 135–145)

## 2011-09-08 LAB — HEMOGLOBIN AND HEMATOCRIT, BLOOD: HCT: 30.6 % — ABNORMAL LOW (ref 36.0–46.0)

## 2011-09-08 MED ORDER — DEXTROSE 5 % IV BOLUS
500.0000 mL | Freq: Once | INTRAVENOUS | Status: AC
  Administered 2011-09-08: 500 mL via INTRAVENOUS

## 2011-09-08 MED ORDER — FERROUS SULFATE 325 (65 FE) MG PO TABS: 325.0000 mg | ORAL_TABLET | Freq: Three times a day (TID) | ORAL | Status: DC

## 2011-09-08 NOTE — Progress Notes (Signed)
Paged at 1134 and arrived in unit at 29. Chaplain visit unavoidably delayed because of patient and room clean up.   [name] is a person of faith in the [place] traditions. It is apparent she suffers under Dementia and a depressive disorder diagnosed in her charts. She responses to any query with the sentence "I don't know the answers to your questions." She says she will be going back to her home where all five of her children live with her. Yet she professes she has no idea where her children are and whether any are married. When conversation is at a standstill she verbally lists her children's names, a list that is exactly the same each time, and in the same order being named. When relating to the house she lives in she speaks of loneliness and a feeling of fear dying alone.  When speaking openly about her depressive disorder, she says she cannot shake the feelings of despair and fright. Of the two her fright is the most dominant feeling. She mentions some medications as being frightening but did not elaborate.  She also talks about being a bad person, who has a bad soul. Again she did not elaborate on this, other than to say she has mentioned this to "people" and they seem to confirm this. This was not my impression, she seemed genuine in her thoughts, however muddled, and genuine in her love/affection for her children.  Of course facts concerning her living conditions counter her story. She is a resident of a Care Facility and has been for several years.   Questions still of concern are 1) what does she fear? And 2) what makes her think she is a bad person and what can be done to assist her find forgiveness/closure/peace?  During our prayers for peace and her fears to go away, Erica Mccosh was the calmest she was during our visit.   Erica Burford says she feels safe here, that the nurses protect her to the point she feels comfortable. They should be greatly complimented for this, given Erica Forrest's  great fears and depression.  Consult and conversation with Unit Social Worker yielded great insights into this situation. My deepest thanks for the professional exchange and her professionalism.  Follow up by chaplains to build on the positive relationship established.  Benjie Karvonen. Powell Halbert, D.Min [employer]

## 2011-09-08 NOTE — Progress Notes (Signed)
Pt is alert to self how ever she does express, feelings of hopelessness. She is very tearful at times, and feels like she is a bad person.She has requested to speak to a [place] for prayer. Will continue to monitor and support.

## 2011-09-08 NOTE — Discharge Summary (Addendum)
[facility]                                                                                   [name], is a 77 y.o. female  DOB 1934/01/29  MRN [id number].  Admission date:  09/06/2011  Discharge Date:  09/09/2011  Primary MD  GREEN, Lenon Curt, MD  Admitting Physician  Alison Murray, MD  Admission Diagnosis  Depressive disorder, not elsewhere classified [311] Essential hypertension, benign [401.1] Acute blood loss anemia [285.1] Anemia [285.9] Dementia [294.20] ANEMIA  Discharge Diagnosis     Principal Problem:  *History of iron deficiency anemia Active Problems:  Essential hypertension, benign  Dementia  Depressive disorder, not elsewhere classified  Unspecified hypothyroidism  Diabetes insipidus -  lithium-induced    Past Medical History  Diagnosis Date  . Thyroid cancer   . Ulcer     chronic  . Depression   . Vitamin d deficiency   . Anxiety   . Malaise and fatigue   . Osteoarthritis   . Kidney disease   . Pneumonia   . Dementia   . Alzheimer disease   . Hypothyroidism   . Bipolar 1 disorder   . Dehydration   . Hypertension   . Diabetes mellitus     Past Surgical History  Procedure Date  . Esophagogastroduodenoscopy 02/23/2011    Procedure: ESOPHAGOGASTRODUODENOSCOPY (EGD);  Surgeon: Theda Belfast, MD;  Location: [facility] ENDOSCOPY;  Service: Endoscopy;  Laterality: N/A;     Recommendations for primary care physician for things to follow:   Follow CBC, BMP, TSH, Free T3 and T4 in 3-4 days.   Discharge Diagnoses:   Principal Problem:  *History of iron deficiency anemia Active Problems:  Essential hypertension, benign  Dementia  Depressive disorder, not elsewhere classified  Unspecified hypothyroidism  Diabetes insipidus -  lithium-induced    Discharge Condition: Stable   Diet recommendation: See Discharge Instructions below   Consults None   History of present illness and  Hospital Course:  See H&P, Labs, Consult  and Test reports for all details in brief, patient was admitted for -    Severe iron deficiency anemia which appears to be chronic in nature, he should was Hemoccult-negative, she received 1.5 units (0.5 unit - wasted as pt pulled IV) of packed RBC transfusion this admission along with IV iron supplementation. She was Hemoccult-negative her iron studies were suggestive of severe iron deficiency, her H&H posttransfusion is stable there is no obvious signs of GI bleed.  I have discussed her case with her daughter in detail today prior to discharge, considering patient's advanced dementia, virtually bedbound status and poor quality of life I do not think she warrants extensive GI workup as she is not a candidate for surgery radiation or IMA therapy, supportive care seems to be the most appropriate line of care.  He should also carries a diagnosis of diabetes insipidus for which she is taking desmopressin and amiloride, sodium was slightly high today for which I have given her 500 cc of D5W, will request nursing home staff to encourage patient on keeping herself hydrated, repeat BMP in 3-4 days, recommended outpatient nephrology followup. Apparently her DI  was caused due to lithium use.  Should also has advanced dementia and depression for which her home medications will be continued with outpatient followup.   Patient's TSH was slightly lower than normal she is on Synthroid supplementation will request a repeat TSH along with free T3-T4 in 3-4 days and adjust Synthroid dose as needed if TSH is low again.   Lab Results  Component Value Date   TSH 0.159* 09/07/2011    Results for [name] (MRN [id number]) as of 09/08/2011 09:27  Ref. Range 09/07/2011 14:05  TSH Latest Range: 0.350-4.500 uIU/mL 0.159 (L)  Free T4 Latest Range: 0.80-1.80 ng/dL 5.28  T3, Free Latest Range: 2.3-4.2 pg/mL 2.4     Today   Subjective:   [name] today has no headache,no chest abdominal pain,no new weakness  tingling or numbness, feels much better.  Objective:   Blood pressure 144/88, pulse 83, temperature 98.1 F (36.7 C), temperature source Oral, resp. rate 18, height 5\' 4"  (1.626 m), weight 68.3 kg (150 lb 9.2 oz), SpO2 100.00%.   Intake/Output Summary (Last 24 hours) at 09/09/11 0859 Last data filed at 09/08/11 1100  Gross per 24 hour  Intake    740 ml  Output      0 ml  Net    740 ml    Exam Awake not alert Oriented x 0 , No new F.N deficits, Normal affect [place].AT,PERRAL Supple Neck,No JVD, No cervical lymphadenopathy appriciated.  Symmetrical Chest wall movement, Good air movement bilaterally, CTAB RRR,No Gallops,Rubs or new Murmurs, No Parasternal Heave +ve B.Sounds, Abd Soft, Non tender, No organomegaly appriciated, No rebound -guarding or rigidity. No Cyanosis, Clubbing or edema, No new Rash or bruise  Data Review   Major procedures and Radiology Reports - PLEASE review detailed and final reports for all details in brief -      Dg Chest Portable 1 View  09/06/2011  *RADIOLOGY REPORT*  Clinical Data: Weakness and shortness of breath.  PORTABLE CHEST - 1 VIEW  Comparison: Chest x-ray 09/11/2010.  Findings: Lung volumes are normal.  No acute consolidative airspace disease.  No definite pleural effusions.  Pulmonary vasculature is normal.  Heart size is normal. The patient is rotated to the right on today's exam, resulting in distortion of the mediastinal contours and reduced diagnostic sensitivity and specificity for mediastinal pathology.  Surgical clips at the level of the thoracic inlet, likely from prior thyroidectomy.  Atherosclerosis in the thoracic aorta.  Retrocardiac density, likely represents a hiatal hernia (similar to priors).  IMPRESSION: 1.  No radiographic evidence of acute cardiopulmonary disease. 2.  Hiatal hernia again noted. 3.  Atherosclerosis.   Original Report Authenticated By: Florencia Reasons, M.D.     Micro Results      Recent Results (from the past  240 hour(s))  MRSA PCR SCREENING     Status: Normal   Collection Time   09/07/11 10:43 AM      Component Value Range Status Comment   MRSA by PCR NEGATIVE  NEGATIVE Final      CBC w Diff: Lab Results  Component Value Date   WBC 5.7 09/07/2011   HGB 8.7* 09/08/2011   HCT 30.6* 09/08/2011   PLT PLATELET CLUMPS NOTED ON SMEAR, COUNT APPEARS ADEQUATE 09/07/2011   LYMPHOPCT 20 09/07/2011   MONOPCT 10 09/07/2011   EOSPCT 3 09/07/2011   BASOPCT 0 09/07/2011    CMP: Lab Results  Component Value Date   NA 147* 09/08/2011   K 4.5 09/08/2011  CL 116* 09/08/2011   CO2 23 09/08/2011   BUN 15 09/08/2011   CREATININE 0.95 09/08/2011   PROT 6.8 09/07/2011   ALBUMIN 3.1* 09/07/2011   BILITOT 0.4 09/07/2011   ALKPHOS 84 09/07/2011   AST 17 09/07/2011   ALT 8 09/07/2011  . Results for ELDRED, LIEVANOS (MRN [id number]) as of 09/08/2011 09:27  Ref. Range 09/06/2011 19:55  Iron Latest Range: 42-135 ug/dL 16 (L)  UIBC Latest Range: 125-400 ug/dL 132 (H)  TIBC Latest Range: 250-470 ug/dL 440  Saturation Ratios Latest Range: 20-55 % 4 (L)  Ferritin Latest Range: 10-291 ng/mL 6 (L)  Folate No range found >20.0   Results for KRYSTIE, LEITER (MRN [id number]) as of 09/08/2011 09:27  Ref. Range 09/06/2011 19:55  Vitamin B-12 Latest Range: 211-911 pg/mL 440   Results for MARIEL, LUKINS (MRN [id number]) as of 09/08/2011 09:27  Ref. Range 09/06/2011 21:32  Fecal Occult Blood, POC No range found NEGATIVE     Discharge Instructions     Follow with Primary MD GREEN, Lenon Curt, MD in 4 days   Get CBC, BMP, TSH, Free T3 and Free T4  checked 4 days by Primary MD and again as instructed by your Primary MD.   Get Medicines reviewed and adjusted.  Please request your Prim.MD to go over all Hospital Tests and Procedure/Radiological results at the follow up, please get all Hospital records sent to your Prim MD by signing hospital release before you go home.  Activity: As tolerated with Full fall precautions use walker/cane &  assistance as needed   Diet:  Dysphagia 3 diet with full assistance and Aspiration precautions.  For Heart failure patients - Check your Weight same time everyday, if you gain over 2 pounds, or you develop in leg swelling, experience more shortness of breath or chest pain, call your Primary MD immediately. Follow Cardiac Low Salt Diet and 1.8 lit/day fluid restriction.  Disposition SNF  If you experience worsening of your admission symptoms, develop shortness of breath, life threatening emergency, suicidal or homicidal thoughts you must seek medical attention immediately by calling 911 or calling your MD immediately  if symptoms less severe.  You Must read complete instructions/literature along with all the possible adverse reactions/side effects for all the Medicines you take and that have been prescribed to you. Take any new Medicines after you have completely understood and accpet all the possible adverse reactions/side effects.   Do not drive and provide baby sitting services if your were admitted for syncope or siezures until you have seen by Primary MD or a Neurologist and advised to do so again.  Do not drive when taking Pain medications.    Do not take more than prescribed Pain, Sleep and Anxiety Medications  Special Instructions: If you have smoked or chewed Tobacco  in the last 2 yrs please stop smoking, stop any regular Alcohol  and or any Recreational drug use.  Wear Seat belts while driving.  Follow-up Information    Follow up with GREEN, Lenon Curt, MD. Schedule an appointment as soon as possible for a visit in 4 days.   Contact information:   [address] [place] [address] [phone]       Follow up with DETERDING,JAMES L, MD. Schedule an appointment as soon as possible for a visit in 1 week.   Contact information:   [address] [employer] [place] [address] [phone]            Discharge  Medications     [name], [name]  Home Medication Instructions ZOX:[id number]   Printed on:09/09/11 0941  Medication Information                    Multiple Vitamin (MULTIVITAMIN) tablet Take 1 tablet by mouth daily.             clobetasol (TEMOVATE) 0.05 % external solution Apply 1 application topically 2 (two) times daily.           ALPRAZolam (XANAX) 0.25 MG tablet Take 0.25 mg by mouth 3 (three) times daily as needed. anxiety           diclofenac sodium (VOLTAREN) 1 % GEL Apply 1 application topically 4 (four) times daily. Apply 4 grams to left shoulder and left knee            calcium-vitamin D (OSCAL WITH D) 500-200 MG-UNIT per tablet Take 2 tablets by mouth daily.            HYDROcodone-acetaminophen (VICODIN) 5-500 MG per tablet Take 1 tablet by mouth every 6 (six) hours as needed. Pain            atorvastatin (LIPITOR) 10 MG tablet Take 10 mg by mouth at bedtime.            polyethylene glycol (MIRALAX / GLYCOLAX) packet Take 17 g by mouth 2 (two) times daily.           amLODipine (NORVASC) 5 MG tablet Take 5 mg by mouth daily.           levothyroxine (SYNTHROID, LEVOTHROID) 125 MCG tablet Take 125 mcg by mouth daily.           traZODone (DESYREL) 50 MG tablet Take 50 mg by mouth at bedtime.           cholecalciferol (VITAMIN D) 1000 UNITS tablet Take 1,000 Units by mouth daily.           lansoprazole (PREVACID) 30 MG capsule Take 30 mg by mouth 2 (two) times daily.           aMILoride (MIDAMOR) 5 MG tablet Take 1 tablet (5 mg total) by mouth daily.           DULoxetine (CYMBALTA) 30 MG capsule Take 30 mg by mouth daily.           desmopressin (DDAVP) 0.1 MG tablet Take 0.1 mg by mouth every evening.           desmopressin (DDAVP) 0.2 MG tablet Take 0.2 mg by mouth 2 (two) times daily.           hydrOXYzine (ATARAX/VISTARIL) 25 MG tablet Take 25 mg by mouth 3 (three) times daily as needed. anxiety           sennosides-docusate sodium (SENOKOT-S) 8.6-50 MG  tablet Take 2 tablets by mouth at bedtime.           paliperidone (INVEGA) 3 MG 24 hr tablet Take 3 mg by mouth at bedtime.           ferrous sulfate 325 (65 FE) MG tablet Take 1 tablet (325 mg total) by mouth 3 (three) times daily with meals.              Total Time in preparing paper work, data evaluation and todays exam - 35 minutes  Leroy Sea M.D on 09/09/2011 at 8:59 AM  [facility] Office  [phone]

## 2011-09-08 NOTE — Progress Notes (Signed)
The Pt is alert to self. She does express feelings of hopelessness at times. Can be very tearful. She has requested prayer at this time.She states." I feel like a bad person".Will continue to monitor and support.

## 2011-09-09 LAB — GLUCOSE, CAPILLARY

## 2011-09-09 NOTE — Progress Notes (Signed)
Discharge instructions accompanied pt, left the unit in stable condition via ambulance to SNF. 

## 2011-09-12 ENCOUNTER — Other Ambulatory Visit: Payer: Medicare Other

## 2011-09-13 ENCOUNTER — Ambulatory Visit
Admission: RE | Admit: 2011-09-13 | Discharge: 2011-09-13 | Disposition: A | Discharge status: Home / Self Care | Payer: Medicare Other | Source: Ambulatory Visit | Attending: Internal Medicine | Admitting: Internal Medicine

## 2011-09-13 DIAGNOSIS — R1900 Intra-abdominal and pelvic swelling, mass and lump, unspecified site: Secondary | ICD-10-CM

## 2011-09-13 DIAGNOSIS — K5792 Diverticulitis of intestine, part unspecified, without perforation or abscess without bleeding: Secondary | ICD-10-CM

## 2011-09-13 MED ORDER — IOHEXOL 300 MG/ML  SOLN
100.0000 mL | Freq: Once | INTRAMUSCULAR | Status: AC | PRN
  Administered 2011-09-13: 100 mL via INTRAVENOUS

## 2011-10-17 ENCOUNTER — Other Ambulatory Visit: Payer: Self-pay | Admitting: Internal Medicine

## 2011-10-17 DIAGNOSIS — K625 Hemorrhage of anus and rectum: Secondary | ICD-10-CM

## 2011-10-17 DIAGNOSIS — D649 Anemia, unspecified: Secondary | ICD-10-CM

## 2011-10-18 ENCOUNTER — Other Ambulatory Visit: Payer: Medicare Other

## 2012-04-11 ENCOUNTER — Encounter: Payer: Self-pay | Admitting: Adult Health

## 2012-04-11 ENCOUNTER — Non-Acute Institutional Stay (SKILLED_NURSING_FACILITY): Payer: Medicare Other | Admitting: Adult Health

## 2012-04-11 DIAGNOSIS — D649 Anemia, unspecified: Secondary | ICD-10-CM

## 2012-04-11 DIAGNOSIS — F319 Bipolar disorder, unspecified: Secondary | ICD-10-CM

## 2012-04-11 DIAGNOSIS — F0390 Unspecified dementia without behavioral disturbance: Secondary | ICD-10-CM

## 2012-04-11 DIAGNOSIS — K219 Gastro-esophageal reflux disease without esophagitis: Secondary | ICD-10-CM

## 2012-04-11 DIAGNOSIS — E785 Hyperlipidemia, unspecified: Secondary | ICD-10-CM

## 2012-04-11 DIAGNOSIS — M810 Age-related osteoporosis without current pathological fracture: Secondary | ICD-10-CM | POA: Insufficient documentation

## 2012-04-11 DIAGNOSIS — E232 Diabetes insipidus: Secondary | ICD-10-CM

## 2012-04-11 DIAGNOSIS — K5900 Constipation, unspecified: Secondary | ICD-10-CM

## 2012-04-11 DIAGNOSIS — E039 Hypothyroidism, unspecified: Secondary | ICD-10-CM

## 2012-04-11 DIAGNOSIS — I10 Essential (primary) hypertension: Secondary | ICD-10-CM

## 2012-04-11 NOTE — Progress Notes (Signed)
Patient ID: [name], female   DOB: 30-Nov-1934, 78 y.o.   MRN: [id number]  FACILITY: [facility]   Allergies  Allergen Reactions  . Lithium   . Penicillins   . Sulfa Antibiotics      Chief Complaint  Patient presents with  . Medical Managment of Chronic Issues    HPI: She is being seen for the management of chronic illnesses; she is stable at this time; no concerns being voiced by the patient; family or staff members     Past Medical History  Diagnosis Date  . Thyroid cancer   . Ulcer     chronic  . Depression   . Vitamin D deficiency   . Anxiety   . Malaise and fatigue   . Osteoarthritis   . Kidney disease   . Pneumonia   . Dementia   . Alzheimer disease   . Hypothyroidism   . Bipolar 1 disorder   . Dehydration   . Hypertension   . Diabetes mellitus     Past Surgical History  Procedure Laterality Date  . Esophagogastroduodenoscopy  02/23/2011    Procedure: ESOPHAGOGASTRODUODENOSCOPY (EGD);  Surgeon: Theda Belfast, MD;  Location: [facility] ENDOSCOPY;  Service: Endoscopy;  Laterality: N/A;    VITAL SIGNS BP 107/61  Pulse 90  Ht 5\' 4"  (1.626 m)  Wt 154 lb (69.854 kg)  BMI 26.42 kg/m2   Patient's Medications  New Prescriptions   No medications on file  Previous Medications   ALPRAZOLAM (XANAX) 0.25 MG TABLET    Take 0.25 mg by mouth 3 (three) times daily as needed. anxiety   AMILORIDE (MIDAMOR) 5 MG TABLET    Take 1 tablet (5 mg total) by mouth daily.   AMLODIPINE (NORVASC) 5 MG TABLET    Take 5 mg by mouth daily.   ATORVASTATIN (LIPITOR) 10 MG TABLET    Take 10 mg by mouth at bedtime.    CALCIUM-VITAMIN D (OSCAL WITH D) 500-200 MG-UNIT PER TABLET    Take 2 tablets by mouth daily.    CHOLECALCIFEROL (VITAMIN D) 1000 UNITS TABLET    Take 1,000 Units by mouth daily.   CLOBETASOL (TEMOVATE) 0.05 % EXTERNAL SOLUTION    Apply 1 application topically 2 (two) times daily.   DESMOPRESSIN (DDAVP) 0.2 MG TABLET    Take 0.2 mg by mouth 2 (two) times daily. Take 2 tabs  twice daily   DICLOFENAC SODIUM (VOLTAREN) 1 % GEL    Apply 1 application topically 4 (four) times daily. Apply 4 grams to left shoulder and left knee    HYDROCODONE-ACETAMINOPHEN (NORCO/VICODIN) 5-325 MG PER TABLET    Take 1 tablet by mouth every 6 (six) hours as needed for pain.   HYDROXYZINE (ATARAX/VISTARIL) 25 MG TABLET    Take 25 mg by mouth 3 (three) times daily as needed. anxiety   LANSOPRAZOLE (PREVACID) 30 MG CAPSULE    Take 30 mg by mouth 2 (two) times daily.   LEVOTHYROXINE (SYNTHROID, LEVOTHROID) 125 MCG TABLET    Take 112 mcg by mouth daily.    MULTIPLE VITAMIN (MULTIVITAMIN) TABLET    Take 1 tablet by mouth daily.     PALIPERIDONE (INVEGA) 3 MG 24 HR TABLET    Take 1.5 mg by mouth at bedtime.    PAROXETINE (PAXIL) 20 MG TABLET    Take 20 mg by mouth every morning.   POLYETHYLENE GLYCOL (MIRALAX / GLYCOLAX) PACKET    Take 17 g by mouth 2 (two) times daily.   SENNOSIDES-DOCUSATE SODIUM (SENOKOT-S)  8.6-50 MG TABLET    Take 2 tablets by mouth at bedtime.   TRAZODONE (DESYREL) 50 MG TABLET    Take 25 mg by mouth at bedtime.    TRIAMTERENE-HYDROCHLOROTHIAZIDE (MAXZIDE-25) 37.5-25 MG PER TABLET    Take 1 tablet by mouth daily.  Modified Medications   No medications on file  Discontinued Medications   DESMOPRESSIN (DDAVP) 0.1 MG TABLET    Take 0.2 mg by mouth 2 (two) times daily. Take 2 tabs twice daily   DULOXETINE (CYMBALTA) 30 MG CAPSULE    Take 30 mg by mouth daily.   FERROUS SULFATE 325 (65 FE) MG TABLET    Take 1 tablet (325 mg total) by mouth 3 (three) times daily with meals.   HYDROCODONE-ACETAMINOPHEN (VICODIN) 5-500 MG PER TABLET    Take 1 tablet by mouth every 6 (six) hours as needed. Pain     SIGNIFICANT DIAGNOSTIC EXAMS 03/15/2009- CT Head: Age related cerebral atrophy, ventriculomegaly and periventricular white matter disease. no acute intracranial findings or mass lesions. 09-11-10: chest x-ray: . No acute cardiopulmonary disease. 2. Moderate hiatal hernia. 3. Extensive  degenerative changes of the shoulders. 04-12-11: dexa: t score: -2.4 04-12-11: mammogram: negative  09-12-11 CT head: no acute intracranial abnormality, mass, lesions, or hydrocephalus. 09-13-11: ct of abdomen and pelvis: no acute process or explanation for blood in stool/anemia. cholelithiasis possible. constipation; moderate chronic hiatal hernia.  11-22-11: bone density: t score 0.3   LABS REVIEWED:    04-16-11: glucose 93; bun 25; creat 1.00; k+ 4.0; na++ 148 05-02-11: glucose 112; bun 25; creat .01; k+ 4.5; na++144  07-02-11: glucose 125; bun 32; creat 1.02; k+ 5.2; na++ 149 08/31/11 CBC wbc 7.8, Hgb 7.2, Hct 276.1, plt 269 09-05-11 CBC wbc 7.0, Hgb 7.8, Hct 28.0, plt 351 9-4-13wbc 13.8; hgb 12.9; hct 31.4; plt 356 glucose 107; bun 27; creat 1.3; k+ 4.4; na++ 145   Akl phos 127, bilirubin 0.6, AST 60, ALT 56, total protein 7.9, albumin 3.8, TSH 1.043, Free T3 2.2, Free T4 1.27 9-11/13 CBC wbc 6.3, Hgb 9.3, Hct 29.8, plt 262 11-22-11: wbc 6.0; hgb 11.9; hct 35.6; mcv 85.3; plt 202; glucose 133; bun 30; creat 1.12; k+ 4.5 ;na++ 146 iron 47; tibc 233; ferritin 72 12-12-11: glucose 76; bun 21; creat 1.11; k+ 4.7; na++ 141 12-31-11: hgb a1c 6.9  02-18-12: wbc 5.8; hgb 13.5; hct 401.; mcv 90.9; plt 228; glucose 108; bun 22; creat 1.08; k+ 4.5 Na++ 152; liver normal albumin.7 02-26-12: wbc 6.4; hgb 13.6; hct 40.7; mcv 90.6 ;plt 203    Review of Systems  Constitutional: Negative for malaise/fatigue.  Respiratory: Negative for cough and shortness of breath.   Cardiovascular: Negative for chest pain, palpitations and leg swelling.  Gastrointestinal: Negative for heartburn, abdominal pain and constipation.  Musculoskeletal: Negative for myalgias and joint pain.  Skin: Negative.   Neurological: Negative for headaches.  Psychiatric/Behavioral: Negative for depression. The patient is nervous/anxious. The patient does not have insomnia.     Physical Exam  Constitutional: She appears well-developed and  well-nourished.  Neck: Neck supple. No JVD present. No thyromegaly present.  Cardiovascular: Normal rate, regular rhythm and intact distal pulses.   Respiratory: Effort normal and breath sounds normal. No respiratory distress.  GI: Soft. Bowel sounds are normal. She exhibits no distension. There is no tenderness.  Musculoskeletal: Normal range of motion. She exhibits no edema.  Neurological: She is alert.  Skin: Skin is warm and dry.  Psychiatric: She has a normal mood and affect.  ASSESSMENT/ PLAN:  Dementia No change in status; presently not taking medications will monitor her status  Manic depressive disorder Is stable will continue her invega; paxil; xanax three times daily as needed; and will continue to monitor her status   Diabetes insipidus -  lithium-induced Will continue ddavp 0.2 mg twice daily and 0.1 mg nightly   Unspecified hypothyroidism Will continue synthroid 125 mcg daily   Dyslipidemia Will continue lipitor 10 mg daily   GERD (gastroesophageal reflux disease) Will continue prevacid  Anemia, unspecified Will continue iron and will monitor  Essential hypertension, benign Stable will continue norvasc 5 mg daily    Will check lipids and cmp

## 2012-06-03 ENCOUNTER — Non-Acute Institutional Stay (SKILLED_NURSING_FACILITY): Payer: Medicare Other | Admitting: Adult Health

## 2012-06-03 ENCOUNTER — Encounter: Payer: Self-pay | Admitting: Adult Health

## 2012-06-03 DIAGNOSIS — F319 Bipolar disorder, unspecified: Secondary | ICD-10-CM

## 2012-06-03 DIAGNOSIS — F3289 Other specified depressive episodes: Secondary | ICD-10-CM

## 2012-06-03 DIAGNOSIS — K5900 Constipation, unspecified: Secondary | ICD-10-CM

## 2012-06-03 DIAGNOSIS — E232 Diabetes insipidus: Secondary | ICD-10-CM

## 2012-06-03 DIAGNOSIS — M1990 Unspecified osteoarthritis, unspecified site: Secondary | ICD-10-CM

## 2012-06-03 DIAGNOSIS — E039 Hypothyroidism, unspecified: Secondary | ICD-10-CM

## 2012-06-03 DIAGNOSIS — K219 Gastro-esophageal reflux disease without esophagitis: Secondary | ICD-10-CM

## 2012-06-03 DIAGNOSIS — E785 Hyperlipidemia, unspecified: Secondary | ICD-10-CM | POA: Insufficient documentation

## 2012-06-03 DIAGNOSIS — F329 Major depressive disorder, single episode, unspecified: Secondary | ICD-10-CM

## 2012-06-03 DIAGNOSIS — I10 Essential (primary) hypertension: Secondary | ICD-10-CM

## 2012-06-03 NOTE — Assessment & Plan Note (Signed)
Will continue miralax twice daily; senna s 2 tabs daily

## 2012-06-03 NOTE — Assessment & Plan Note (Addendum)
She is stable will continue her invega 1.5 mg nightly will continue her atarax 25 mg three times daily as needed for anxiety and her trazodone 25 mg nightly for sleep

## 2012-06-03 NOTE — Assessment & Plan Note (Signed)
Will continue her lipitor 10 mg daily

## 2012-06-03 NOTE — Assessment & Plan Note (Signed)
Will continue synthroid 112 mcg daily

## 2012-06-03 NOTE — Assessment & Plan Note (Signed)
Will continue maxzide 25  daily and norvasc 5 mg daily

## 2012-06-03 NOTE — Assessment & Plan Note (Signed)
She is stable will continue ddpv 0.2 mg (2 tabs) twice daily and will continue to monitor her status

## 2012-06-03 NOTE — Assessment & Plan Note (Signed)
Will continue prevacid 30 mg daily

## 2012-06-03 NOTE — Progress Notes (Signed)
Patient ID: [name], female   DOB: [birth year], 78 y.o.   MRN: [id number] FACILITY: [facility]  Allergies  Allergen Reactions  . Lithium   . Penicillins   . Sulfa Antibiotics      Chief Complaint  Patient presents with  . Medical Managment of Chronic Issues    HPI:  She is being seen for management of her chronic illnesses; she is presently stable. She has swelling present in her left ankle the x-ray performed did not demonstrate any fracture or dislocation. She is not presently complaining of pain present does have pain upon palpation present. Is able to move ankle she does not remember any injury present. I have spoken with her daughter; both of [place] feel as though she has had a slight twist of her ankle in the recent past; which did not cause her any pain at the time. Will treat conservatively and will monitor her status    Past Medical History  Diagnosis Date  . Thyroid cancer   . Ulcer     chronic  . Depression   . Vitamin D deficiency   . Anxiety   . Malaise and fatigue   . Osteoarthritis   . Kidney disease   . Pneumonia   . Dementia   . Alzheimer disease   . Hypothyroidism   . Bipolar 1 disorder   . Dehydration   . Hypertension   . Diabetes mellitus     Past Surgical History  Procedure Laterality Date  . Esophagogastroduodenoscopy  02/23/2011    Procedure: ESOPHAGOGASTRODUODENOSCOPY (EGD);  Surgeon: Theda Belfast, MD;  Location: [facility] ENDOSCOPY;  Service: Endoscopy;  Laterality: N/A;    VITAL SIGNS BP 132/70  Pulse 75  Ht 5\' 4"  (1.626 m)  Wt 153 lb (69.4 kg)  BMI 26.25 kg/m2   Patient's Medications  New Prescriptions   No medications on file  Previous Medications   ALPRAZOLAM (XANAX) 0.25 MG TABLET    Take 0.25 mg by mouth 3 (three) times daily as needed. anxiety   AMILORIDE (MIDAMOR) 5 MG TABLET    Take 1 tablet (5 mg total) by mouth daily.   AMLODIPINE (NORVASC) 5 MG TABLET    Take 5 mg by mouth daily.   ATORVASTATIN (LIPITOR) 10 MG TABLET    Take  10 mg by mouth at bedtime.    CALCIUM-VITAMIN D (OSCAL WITH D) 500-200 MG-UNIT PER TABLET    Take 2 tablets by mouth daily.    CHOLECALCIFEROL (VITAMIN D) 1000 UNITS TABLET    Take 1,000 Units by mouth daily.   CLOBETASOL (TEMOVATE) 0.05 % EXTERNAL SOLUTION    Apply 1 application topically 2 (two) times daily.   DESMOPRESSIN (DDAVP) 0.2 MG TABLET    Take 0.2 mg by mouth 2 (two) times daily. Take 2 tabs twice daily   DICLOFENAC SODIUM (VOLTAREN) 1 % GEL    Apply 1 application topically 4 (four) times daily. Apply 4 grams to left shoulder and left knee    HYDROCODONE-ACETAMINOPHEN (NORCO/VICODIN) 5-325 MG PER TABLET    Take 1 tablet by mouth every 6 (six) hours as needed for pain.   HYDROXYZINE (ATARAX/VISTARIL) 25 MG TABLET    Take 25 mg by mouth 3 (three) times daily as needed. anxiety   LANSOPRAZOLE (PREVACID) 30 MG CAPSULE    Take 30 mg by mouth 2 (two) times daily.   LEVOTHYROXINE (SYNTHROID, LEVOTHROID) 125 MCG TABLET    Take 112 mcg by mouth daily.    MULTIPLE VITAMIN (MULTIVITAMIN) TABLET  Take 1 tablet by mouth daily.     PALIPERIDONE (INVEGA) 3 MG 24 HR TABLET    Take 1.5 mg by mouth at bedtime.    PAROXETINE (PAXIL) 20 MG TABLET    Take 20 mg by mouth every morning.   POLYETHYLENE GLYCOL (MIRALAX / GLYCOLAX) PACKET    Take 17 g by mouth 2 (two) times daily.   SENNOSIDES-DOCUSATE SODIUM (SENOKOT-S) 8.6-50 MG TABLET    Take 2 tablets by mouth at bedtime.   TRAZODONE (DESYREL) 50 MG TABLET    Take 25 mg by mouth at bedtime.    TRIAMTERENE-HYDROCHLOROTHIAZIDE (MAXZIDE-25) 37.5-25 MG PER TABLET    Take 1 tablet by mouth daily.  Modified Medications   No medications on file  Discontinued Medications   DESMOPRESSIN (DDAVP) 0.1 MG TABLET    Take 0.2 mg by mouth 2 (two) times daily. Take 2 tabs twice daily   DULOXETINE (CYMBALTA) 30 MG CAPSULE    Take 30 mg by mouth daily.   FERROUS SULFATE 325 (65 FE) MG TABLET    Take 1 tablet (325 mg total) by mouth 3 (three) times daily with meals.    HYDROCODONE-ACETAMINOPHEN (VICODIN) 5-500 MG PER TABLET    Take 1 tablet by mouth every 6 (six) hours as needed. Pain     SIGNIFICANT DIAGNOSTIC EXAMS  03/15/2009- CT Head: Age related cerebral atrophy, ventriculomegaly and periventricular white matter disease. no acute intracranial findings or mass lesions. 09-11-10: chest x-ray: . No acute cardiopulmonary disease. 2. Moderate hiatal hernia. 3. Extensive degenerative changes of the shoulders. 04-12-11: dexa: t score: -2.4 04-12-11: mammogram: negative  09-12-11 CT head: no acute intracranial abnormality, mass, lesions, or hydrocephalus. 09-13-11: ct of abdomen and pelvis: no acute process or explanation for blood in stool/anemia. cholelithiasis possible. constipation; moderate chronic hiatal hernia.  11-22-11: bone density: t score 0.3 06-02-12: left ankle x-ray: moderate diffuse osteopenia; mild to moderate degenerative joint space narrowing; mild degenerative irregularity at the medial malleolus; mild soft tissue swelling seen diffusely; there is fusion seen at the midfoot and hindfoot with old traumatic and postsurgical deformity seen and orthopedic hardware in place. No radiographic evidence for ostoemyelitis.   LAB REVIEWED:  07-02-11: glucose 125; bun 32; creat 1.02; k+ 5.2; na++ 149 08/31/11 CBC wbc 7.8, Hgb 7.2, Hct 276.1, plt 269 09-05-11 CBC wbc 7.0, Hgb 7.8, Hct 28.0, plt 351 9-4-13wbc 13.8; hgb 12.9; hct 31.4; plt 356 glucose 107; bun 27; creat 1.3; k+ 4.4; na++ 145   Akl phos 127, bilirubin 0.6, AST 60, ALT 56, total protein 7.9, albumin 3.8, TSH 1.043, Free T3 2.2, Free T4 1.27 9-11/13 CBC wbc 6.3, Hgb 9.3, Hct 29.8, plt 262 11-22-11: wbc 6.0; hgb 11.9; hct 35.6; mcv 85.3; plt 202; glucose 133; bun 30; creat 1.12; k+ 4.5 ;na++ 146 iron 47; tibc 233; ferritin 72 12-12-11: glucose 76; bun 21; creat 1.11; k+ 4.7; na++ 141 12-31-11: hgb a1c 6.9  02-18-12: wbc 5.8; hgb 13.5 ;hct 40.1; mcv 90.9; plt 228; glucose 108; bun 22; creat 1.08; k+ 4.5  ; na++ 152; liver normal albumin 3.7 02-26-12: wbc 6.4; hgb 13.6; hct 40.7; mcv 90.6; plt 203 04-14-12: glucose 151; bun 36; creat 1.28; k+ 3.9; na++147; liver normal albumin 4.2 Chol 288; ldl 176; trig 315     Review of Systems  Constitutional: Negative for malaise/fatigue.  Respiratory: Negative for cough and shortness of breath.   Cardiovascular: Negative for chest pain and palpitations.       Left ankle is swollen  Gastrointestinal:  Negative for heartburn, abdominal pain and constipation.  Musculoskeletal: Negative for myalgias and joint pain.  Skin: Negative.   Neurological: Negative for headaches.  Psychiatric/Behavioral: Negative for depression. The patient is not nervous/anxious and does not have insomnia.      Physical Exam  Constitutional: She appears well-developed and well-nourished.  Neck: Neck supple. No thyromegaly present.  Cardiovascular: Normal rate, regular rhythm and intact distal pulses.   Respiratory: Effort normal and breath sounds normal.  GI: Soft. Bowel sounds are normal. There is no tenderness.  Musculoskeletal: Normal range of motion.  1+ left ankle swelling present has mild pain to palpation; is able to move ankle without difficulty  Neurological: She is alert.  Skin: Skin is warm and dry.  Psychiatric: She has a normal mood and affect.       ASSESSMENT/ PLAN:  Diabetes insipidus -  lithium-induced She is stable will continue ddpv 0.2 mg (2 tabs) twice daily and will continue to monitor her status  Depressive disorder, not elsewhere classified Is stable will continue paxil 20 mg daily and xanax 0.25 mg three times daily as needed for anxiety  GERD (gastroesophageal reflux disease) Will continue prevacid 30 mg daily   Unspecified constipation Will continue miralax twice daily; senna s 2 tabs daily   Essential hypertension, benign Will continue maxzide 25  daily and norvasc 5 mg daily   Unspecified hypothyroidism Will continue synthroid  112 mcg daily   Dyslipidemia Will continue her lipitor 10 mg daily   Manic depressive disorder She is stable will continue her invega 1.5 mg nightly will continue her atarax 25 mg three times daily as needed for anxiety and her trazodone 25 mg nightly for sleep   Osteoarthritis Will continue the voltaren gel four times daily to her left shoulder and left knee and her vicodin 5/325 mg every 6 hours as needed will continue to monitor her status; for her swollen left ankle will use an ace wrap daily and will monitor her status    Will check hgb a1c next draw

## 2012-06-03 NOTE — Assessment & Plan Note (Signed)
Will continue the voltaren gel four times daily to her left shoulder and left knee and her vicodin 5/325 mg every 6 hours as needed will continue to monitor her status; for her swollen left ankle will use an ace wrap daily and will monitor her status

## 2012-06-03 NOTE — Assessment & Plan Note (Signed)
Is stable will continue paxil 20 mg daily and xanax 0.25 mg three times daily as needed for anxiety

## 2012-06-19 DIAGNOSIS — D631 Anemia in chronic kidney disease: Secondary | ICD-10-CM | POA: Insufficient documentation

## 2012-06-19 NOTE — Assessment & Plan Note (Signed)
Will continue lipitor 10 mg daily  

## 2012-06-19 NOTE — Assessment & Plan Note (Signed)
Will continue prevacid

## 2012-06-19 NOTE — Assessment & Plan Note (Signed)
Is stable will continue her invega; paxil; xanax three times daily as needed; and will continue to monitor her status

## 2012-06-19 NOTE — Assessment & Plan Note (Signed)
Stable will continue norvasc 5 mg daily

## 2012-06-19 NOTE — Assessment & Plan Note (Signed)
Will continue ddavp 0.2 mg twice daily and 0.1 mg nightly

## 2012-06-19 NOTE — Assessment & Plan Note (Signed)
Will continue iron and will monitor

## 2012-06-19 NOTE — Assessment & Plan Note (Signed)
No change in status; presently not taking medications will monitor her status

## 2012-06-19 NOTE — Assessment & Plan Note (Signed)
Will continue synthroid 125 mcg daily

## 2012-07-11 ENCOUNTER — Other Ambulatory Visit: Payer: Self-pay

## 2012-07-11 ENCOUNTER — Emergency Department (HOSPITAL_COMMUNITY)
Admission: EM | Admit: 2012-07-11 | Discharge: 2012-07-11 | Disposition: A | Discharge status: Home / Self Care | Payer: Medicare Other | Attending: Emergency Medicine | Admitting: Emergency Medicine

## 2012-07-11 ENCOUNTER — Emergency Department (HOSPITAL_COMMUNITY): Payer: Medicare Other

## 2012-07-11 ENCOUNTER — Encounter (HOSPITAL_COMMUNITY): Payer: Self-pay | Admitting: Emergency Medicine

## 2012-07-11 DIAGNOSIS — Z8739 Personal history of other diseases of the musculoskeletal system and connective tissue: Secondary | ICD-10-CM | POA: Insufficient documentation

## 2012-07-11 DIAGNOSIS — Z8701 Personal history of pneumonia (recurrent): Secondary | ICD-10-CM | POA: Insufficient documentation

## 2012-07-11 DIAGNOSIS — Z79899 Other long term (current) drug therapy: Secondary | ICD-10-CM | POA: Insufficient documentation

## 2012-07-11 DIAGNOSIS — R451 Restlessness and agitation: Secondary | ICD-10-CM

## 2012-07-11 DIAGNOSIS — Z862 Personal history of diseases of the blood and blood-forming organs and certain disorders involving the immune mechanism: Secondary | ICD-10-CM | POA: Insufficient documentation

## 2012-07-11 DIAGNOSIS — F3289 Other specified depressive episodes: Secondary | ICD-10-CM | POA: Insufficient documentation

## 2012-07-11 DIAGNOSIS — F329 Major depressive disorder, single episode, unspecified: Secondary | ICD-10-CM | POA: Insufficient documentation

## 2012-07-11 DIAGNOSIS — F0280 Dementia in other diseases classified elsewhere without behavioral disturbance: Secondary | ICD-10-CM | POA: Insufficient documentation

## 2012-07-11 DIAGNOSIS — F411 Generalized anxiety disorder: Secondary | ICD-10-CM | POA: Insufficient documentation

## 2012-07-11 DIAGNOSIS — F29 Unspecified psychosis not due to a substance or known physiological condition: Secondary | ICD-10-CM | POA: Insufficient documentation

## 2012-07-11 DIAGNOSIS — Z85850 Personal history of malignant neoplasm of thyroid: Secondary | ICD-10-CM | POA: Insufficient documentation

## 2012-07-11 DIAGNOSIS — E119 Type 2 diabetes mellitus without complications: Secondary | ICD-10-CM | POA: Insufficient documentation

## 2012-07-11 DIAGNOSIS — Z872 Personal history of diseases of the skin and subcutaneous tissue: Secondary | ICD-10-CM | POA: Insufficient documentation

## 2012-07-11 DIAGNOSIS — IMO0002 Reserved for concepts with insufficient information to code with codable children: Secondary | ICD-10-CM | POA: Insufficient documentation

## 2012-07-11 DIAGNOSIS — G309 Alzheimer's disease, unspecified: Secondary | ICD-10-CM | POA: Insufficient documentation

## 2012-07-11 DIAGNOSIS — F0390 Unspecified dementia without behavioral disturbance: Secondary | ICD-10-CM | POA: Insufficient documentation

## 2012-07-11 DIAGNOSIS — F319 Bipolar disorder, unspecified: Secondary | ICD-10-CM | POA: Insufficient documentation

## 2012-07-11 DIAGNOSIS — Z880 Allergy status to penicillin: Secondary | ICD-10-CM | POA: Insufficient documentation

## 2012-07-11 DIAGNOSIS — I10 Essential (primary) hypertension: Secondary | ICD-10-CM | POA: Insufficient documentation

## 2012-07-11 DIAGNOSIS — Z8639 Personal history of other endocrine, nutritional and metabolic disease: Secondary | ICD-10-CM | POA: Insufficient documentation

## 2012-07-11 DIAGNOSIS — E039 Hypothyroidism, unspecified: Secondary | ICD-10-CM | POA: Insufficient documentation

## 2012-07-11 LAB — RAPID URINE DRUG SCREEN, HOSP PERFORMED
Amphetamines: NOT DETECTED
Benzodiazepines: NOT DETECTED
Opiates: NOT DETECTED
Tetrahydrocannabinol: NOT DETECTED

## 2012-07-11 LAB — URINALYSIS, ROUTINE W REFLEX MICROSCOPIC
Bilirubin Urine: NEGATIVE
Hgb urine dipstick: NEGATIVE
Ketones, ur: NEGATIVE mg/dL
Protein, ur: NEGATIVE mg/dL
Urobilinogen, UA: 1 mg/dL (ref 0.0–1.0)

## 2012-07-11 LAB — CBC
MCH: 30.4 pg (ref 26.0–34.0)
Platelets: 269 10*3/uL (ref 150–400)
RBC: 4.57 MIL/uL (ref 3.87–5.11)
WBC: 10.3 10*3/uL (ref 4.0–10.5)

## 2012-07-11 LAB — TROPONIN I: Troponin I: <0.3 ng/mL (ref <0.30)

## 2012-07-11 LAB — COMPREHENSIVE METABOLIC PANEL
ALT: 15 U/L (ref 0–35)
AST: 23 U/L (ref 0–37)
Albumin: 3.4 g/dL — ABNORMAL LOW (ref 3.5–5.2)
CO2: 28 mEq/L (ref 19–32)
Calcium: 10 mg/dL (ref 8.4–10.5)
Chloride: 97 mEq/L (ref 96–112)
GFR calc non Af Amer: 42 mL/min — ABNORMAL LOW (ref >90)
Sodium: 139 mEq/L (ref 135–145)

## 2012-07-11 MED ORDER — SODIUM CHLORIDE 0.9 % IV BOLUS (SEPSIS)
1000.0000 mL | Freq: Once | INTRAVENOUS | Status: AC
  Administered 2012-07-11: 1000 mL via INTRAVENOUS

## 2012-07-11 NOTE — ED Notes (Signed)
[facility] called for ride home  

## 2012-07-11 NOTE — ED Notes (Signed)
Pt daughter [name] wants to be called with status [phone]

## 2012-07-11 NOTE — ED Notes (Signed)
Pt from [facility] vis EMS- Daughter reported to EMS that pt has had a change in mental status since yesterday. Pt has hx of bipolar, alzheimers, but usually calm and quiet. Since yesterday, pt has been upset, panicking, confused and talking about things that aren't real.

## 2012-07-11 NOTE — ED Notes (Signed)
Pt from [facility], daughter at bedside and reports that pt mental status has changed since yesterday. Daughter reports normal is quiet and calm. Yesterday and today, daughter reports anxiety, talking "out of her head" with more intensity today. Daughter adds that pt is having visual disturbances. Pt has hx of Alzheimer's and bipolar. Pt calm and cooperative but not making sense when she talks

## 2012-07-11 NOTE — ED Notes (Signed)
[id number]<AV> Expected date:<BR> Expected time:<BR> Means of arrival:<BR> Comments:<BR> ems- elderly, not acting her norm

## 2012-07-11 NOTE — ED Provider Notes (Signed)
History    CSN: [id number] Arrival date & time 07/11/12  1219  First MD Initiated Contact with Patient 07/11/12 1318     Chief Complaint  Patient presents with  . Altered Mental Status  . psych eval    5 caveat dementia history is obtained the patient's daughter, for records, the patient (Consider location/radiation/quality/duration/timing/severity/associated sxs/prior Treatment) HPI patient more agitated since yesterday.. Patient states "I don't feel well" she is unable to further elaborate on her symptoms. Daughter states she's had similar symptoms in the past, with urinary tract infections. Further history [facility]: Patient was agitated today., Attempted, bad,takingb clothes off,. Patient normally calm and quiet. No treatment prior to coming Past Medical History  Diagnosis Date  . Thyroid cancer   . Ulcer     chronic  . Depression   . Vitamin D deficiency   . Anxiety   . Malaise and fatigue   . Osteoarthritis   . Kidney disease   . Pneumonia   . Dementia   . Alzheimer disease   . Hypothyroidism   . Bipolar 1 disorder   . Dehydration   . Hypertension   . Diabetes mellitus   diabetes insipidus Past Surgical History  Procedure Laterality Date  . Esophagogastroduodenoscopy  02/23/2011    Procedure: ESOPHAGOGASTRODUODENOSCOPY (EGD);  Surgeon: Theda Belfast, MD;  Location: [facility] ENDOSCOPY;  Service: Endoscopy;  Laterality: N/A;  . Abdominal hysterectomy    . Foot surgery    . Retinal detachment surgery    . Thyroidectomy     No family history on file. History  Substance Use Topics  . Smoking status: Never Smoker   . Smokeless tobacco: Not on file  . Alcohol Use: No   OB History   Grav Para Term Preterm Abortions TAB SAB Ect Mult Living                 Review of Systems  Unable to perform ROS: Dementia  Constitutional: Negative.   HENT: Negative.   Respiratory: Negative.   Cardiovascular: Negative.   Gastrointestinal: Negative.    Musculoskeletal: Negative.   Skin: Negative.   Neurological: Negative.   Psychiatric/Behavioral: Negative.     Allergies  Lithium; Penicillins; and Sulfa antibiotics  Home Medications   Current Outpatient Rx  Name  Route  Sig  Dispense  Refill  . ALPRAZolam (XANAX) 0.25 MG tablet   Oral   Take 0.25 mg by mouth 3 (three) times daily as needed. anxiety         . amLODipine (NORVASC) 5 MG tablet   Oral   Take 5 mg by mouth daily.         Marland Kitchen atorvastatin (LIPITOR) 10 MG tablet   Oral   Take 10 mg by mouth at bedtime.          . calcium-vitamin D (OSCAL WITH D) 500-200 MG-UNIT per tablet   Oral   Take 2 tablets by mouth daily.          . cholecalciferol (VITAMIN D) 1000 UNITS tablet   Oral   Take 1,000 Units by mouth daily.         Marland Kitchen desmopressin (DDAVP) 0.2 MG tablet   Oral   Take 0.2 mg by mouth 2 (two) times daily. Take 2 tabs twice daily         . diclofenac sodium (VOLTAREN) 1 % GEL   Topical   Apply 1 application topically 4 (four) times daily. Apply 4 grams to  left shoulder and left knee          . HYDROcodone-acetaminophen (NORCO/VICODIN) 5-325 MG per tablet   Oral   Take 1 tablet by mouth every 6 (six) hours as needed for pain.         . hydrOXYzine (ATARAX/VISTARIL) 25 MG tablet   Oral   Take 25 mg by mouth 3 (three) times daily as needed. anxiety         . lansoprazole (PREVACID) 30 MG capsule   Oral   Take 30 mg by mouth 2 (two) times daily.         Marland Kitchen levothyroxine (SYNTHROID, LEVOTHROID) 112 MCG tablet   Oral   Take 112 mcg by mouth daily before breakfast.         . Multiple Vitamin (MULTIVITAMIN) tablet   Oral   Take 1 tablet by mouth daily.           . paliperidone (INVEGA) 3 MG 24 hr tablet   Oral   Take 1.5 mg by mouth at bedtime.          Marland Kitchen PARoxetine (PAXIL) 20 MG tablet   Oral   Take 20 mg by mouth every morning.         . polyethylene glycol (MIRALAX / GLYCOLAX) packet   Oral   Take 17 g by mouth 2  (two) times daily.         . sennosides-docusate sodium (SENOKOT-S) 8.6-50 MG tablet   Oral   Take 2 tablets by mouth at bedtime.         . traZODone (DESYREL) 50 MG tablet   Oral   Take 25 mg by mouth at bedtime.          . triamterene-hydrochlorothiazide (MAXZIDE-25) 37.5-25 MG per tablet   Oral   Take 1 tablet by mouth daily.          BP 97/72  Pulse 70  Temp(Src) 98.5 F (36.9 C) (Oral)  Resp 16  SpO2 93% Physical Exam  Nursing note and vitals reviewed. Constitutional: She appears well-developed and well-nourished.  HENT:  Head: Normocephalic and atraumatic.  Mucous membranes dry  Eyes: Conjunctivae are normal. Pupils are equal, round, and reactive to light.  Neck: Neck supple. No tracheal deviation present. No thyromegaly present.  Cardiovascular: Normal rate and regular rhythm.   No murmur heard. Pulmonary/Chest: Effort normal and breath sounds normal.  Abdominal: Soft. Bowel sounds are normal. She exhibits no distension. There is no tenderness.  Genitourinary:  Rectum soft brown stool normal tone no gross blood  Musculoskeletal: Normal range of motion. She exhibits no edema and no tenderness.  Neurological: She is alert. Coordination normal.  Follow simple commands moves all extremities  Skin: Skin is warm and dry. No rash noted.  Psychiatric: She has a normal mood and affect.    ED Course  Procedures (including critical care time) Labs Reviewed  CBC  ACETAMINOPHEN LEVEL  COMPREHENSIVE METABOLIC PANEL  ETHANOL  SALICYLATE LEVEL  URINE RAPID DRUG SCREEN (HOSP PERFORMED)  URINALYSIS, ROUTINE W REFLEX MICROSCOPIC   No results found. No diagnosis found.  Date: 07/11/2012  Rate: 90  Rhythm: normal sinus rhythm  QRS Axis: left  Intervals: normal  ST/T Wave abnormalities: nonspecific T wave changes  Conduction Disutrbances:none  Narrative Interpretation:   Old EKG Reviewed: No significant change from 09/06/2011 interpreted by me   Results for  orders placed during the hospital encounter of 07/11/12  ACETAMINOPHEN LEVEL      Result  Value Range   Acetaminophen (Tylenol), Serum <15.0  10 - 30 ug/mL  CBC      Result Value Range   WBC 10.3  4.0 - 10.5 K/uL   RBC 4.57  3.87 - 5.11 MIL/uL   Hemoglobin 13.9  12.0 - 15.0 g/dL   HCT 96.2  95.2 - 84.1 %   MCV 92.1  78.0 - 100.0 fL   MCH 30.4  26.0 - 34.0 pg   MCHC 33.0  30.0 - 36.0 g/dL   RDW 32.4  40.1 - 02.7 %   Platelets 269  150 - 400 K/uL  COMPREHENSIVE METABOLIC PANEL      Result Value Range   Sodium 139  135 - 145 mEq/L   Potassium 3.1 (*) 3.5 - 5.1 mEq/L   Chloride 97  96 - 112 mEq/L   CO2 28  19 - 32 mEq/L   Glucose, Bld 131 (*) 70 - 99 mg/dL   BUN 30 (*) 6 - 23 mg/dL   Creatinine, Ser 2.53 (*) 0.50 - 1.10 mg/dL   Calcium 66.4  8.4 - 40.3 mg/dL   Total Protein 7.9  6.0 - 8.3 g/dL   Albumin 3.4 (*) 3.5 - 5.2 g/dL   AST 23  0 - 37 U/L   ALT 15  0 - 35 U/L   Alkaline Phosphatase 111  39 - 117 U/L   Total Bilirubin 0.3  0.3 - 1.2 mg/dL   GFR calc non Af Amer 42 (*) >90 mL/min   GFR calc Af Amer 49 (*) >90 mL/min  ETHANOL      Result Value Range   Alcohol, Ethyl (B) <11  0 - 11 mg/dL  SALICYLATE LEVEL      Result Value Range   Salicylate Lvl <2.0 (*) 2.8 - 20.0 mg/dL  URINE RAPID DRUG SCREEN (HOSP PERFORMED)      Result Value Range   Opiates NONE DETECTED  NONE DETECTED   Cocaine NONE DETECTED  NONE DETECTED   Benzodiazepines NONE DETECTED  NONE DETECTED   Amphetamines NONE DETECTED  NONE DETECTED   Tetrahydrocannabinol NONE DETECTED  NONE DETECTED   Barbiturates NONE DETECTED  NONE DETECTED  URINALYSIS, ROUTINE W REFLEX MICROSCOPIC      Result Value Range   Color, Urine YELLOW  YELLOW   APPearance CLEAR  CLEAR   Specific Gravity, Urine 1.012  1.005 - 1.030   pH 6.5  5.0 - 8.0   Glucose, UA NEGATIVE  NEGATIVE mg/dL   Hgb urine dipstick NEGATIVE  NEGATIVE   Bilirubin Urine NEGATIVE  NEGATIVE   Ketones, ur NEGATIVE  NEGATIVE mg/dL   Protein, ur NEGATIVE   NEGATIVE mg/dL   Urobilinogen, UA 1.0  0.0 - 1.0 mg/dL   Nitrite NEGATIVE  NEGATIVE   Leukocytes, UA NEGATIVE  NEGATIVE  TROPONIN I      Result Value Range   Troponin I <0.30  <0.30 ng/mL  OCCULT BLOOD, POC DEVICE      Result Value Range   Fecal Occult Bld NEGATIVE  NEGATIVE   Dg Chest 2 View  07/11/2012   *RADIOLOGY REPORT*  Clinical Data: Altered mental status. Hypertension.  CHEST - 2 VIEW  Comparison: 09/06/2011  Findings: There is mild cardiac enlargement.  No pleural effusion or edema identified.  No airspace consolidation noted. There is a moderate-sized hiatal hernia noted.  Surgical clips noted within the expected location of the thyroid bed.  There is advanced osteoarthritis involving both glenohumeral joints.  IMPRESSION:  1.  No acute cardiopulmonary abnormalities.   Original Report Authenticated By: Signa Kell, M.D.   Ct Head Wo Contrast  07/11/2012   *RADIOLOGY REPORT*  Clinical Data: Changes in altered mental status.  CT HEAD WITHOUT CONTRAST  Technique:  Contiguous axial images were obtained from the base of the skull through the vertex without contrast.  Comparison: 09/11/2010  Findings: There is no acute intracranial hemorrhage, infarction, or mass lesion.  There is slight cerebral cortical atrophy, most prominent in the temporal lobes.  There is periventricular white matter lucency consistent with chronic small vessel ischemic disease, most prominent in the frontal lobes, progressed.  No osseous abnormality.  IMPRESSION: No acute abnormality.  Progressive chronic small vessel ischemic changes particularly in the frontal lobes.  Stable atrophy.   Original Report Authenticated By: Francene Boyers, M.D.     450 p.m. patient requesting water. Appears calm. No distress   MDM   No evidence of acute coronary syndrome or infection Return to this facility Dx #1 agitation #2 hypokalemia    Doug Sou, MD 07/11/12 1730

## 2012-07-21 ENCOUNTER — Encounter: Payer: Self-pay | Admitting: Endocrinology

## 2012-07-21 ENCOUNTER — Ambulatory Visit (INDEPENDENT_AMBULATORY_CARE_PROVIDER_SITE_OTHER): Payer: Medicare Other | Admitting: Endocrinology

## 2012-07-21 VITALS — BP 128/76 | HR 99

## 2012-07-21 DIAGNOSIS — E232 Diabetes insipidus: Secondary | ICD-10-CM

## 2012-07-21 DIAGNOSIS — E039 Hypothyroidism, unspecified: Secondary | ICD-10-CM

## 2012-07-21 DIAGNOSIS — E785 Hyperlipidemia, unspecified: Secondary | ICD-10-CM

## 2012-07-21 DIAGNOSIS — E559 Vitamin D deficiency, unspecified: Secondary | ICD-10-CM

## 2012-07-21 LAB — COMPREHENSIVE METABOLIC PANEL
AST: 23 U/L (ref 0–37)
Albumin: 3.7 g/dL (ref 3.5–5.2)
BUN: 25 mg/dL — ABNORMAL HIGH (ref 6–23)
Calcium: 9.5 mg/dL (ref 8.4–10.5)
Chloride: 101 mEq/L (ref 96–112)
Creatinine, Ser: 1.1 mg/dL (ref 0.4–1.2)
Glucose, Bld: 137 mg/dL — ABNORMAL HIGH (ref 70–99)
Potassium: 3.6 mEq/L (ref 3.5–5.1)

## 2012-07-21 LAB — TSH: TSH: 3.33 u[IU]/mL (ref 0.35–5.50)

## 2012-07-21 LAB — T4, FREE: Free T4: 1.14 ng/dL (ref 0.60–1.60)

## 2012-07-21 NOTE — Patient Instructions (Addendum)
Will call orders when labs available

## 2012-07-21 NOTE — Progress Notes (Signed)
Patient ID: [name], female   DOB: 1934/12/26, 78 y.o.   MRN: [id number]  History of Present Illness:  DIABETES insipidus: She has had this for about 2 years. It is associated with increased thirst Not clear this is nephrogenous or central. Urine osmolality has been somewhat low compared to serum osmolality consistently. Difficult to assess her fluid balance since there is no history of her fluid intake or urination.  She is still complaining of thirst  and difficult to assess her urine output  Urine osmolality was 238 in 12/13. urine specific gravity is consistently 1010.  Her treatment has been a trial of Maxzide which did not appear to be helpful and also DDAVP. Her dosage was increased to 5 tablets on the last visit However her sodium in the emergency room recently was evaluated was normal at 139 but has been at high levels persistently before   VITAMIN D deficiency: Previous results showed the following level Lab: Vitamin D 25(OH) 24  VIT D 25(OH) 23.7 L 30.0-100.0 - ng/mL       Hypothyroidism:   Her dose has been adjusted periodically, has had a reduction in dosage on the last visit when TSH was 0.32  Difficult to assess symptomsShe is now on 112 mcg levothyroxine and TSH pending  Hyperlipidemia:  Patient here for followup for Mild hyperlipidemia which is long-standing. She has good medication adherence, her LDL last time was 107 After being changed to Lipitor.        Medication List       This list is accurate as of: 07/21/12 11:51 AM.  Always use your most recent med list.               ALPRAZolam 0.25 MG tablet  Commonly known as:  XANAX  Take 0.25 mg by mouth 3 (three) times daily as needed. anxiety     amLODipine 5 MG tablet  Commonly known as:  NORVASC  Take 5 mg by mouth daily.     atorvastatin 10 MG tablet  Commonly known as:  LIPITOR  Take 10 mg by mouth at bedtime.     calcium-vitamin D 500-200 MG-UNIT per tablet  Commonly known as:  OSCAL WITH D   Take 2 tablets by mouth daily.     cholecalciferol 1000 UNITS tablet  Commonly known as:  VITAMIN D  Take 1,000 Units by mouth daily.     clobetasol cream 0.05 %  Commonly known as:  TEMOVATE     desmopressin 0.2 MG tablet  Commonly known as:  DDAVP  Take 0.2 mg by mouth 2 (two) times daily. Take 2 tabs twice daily     diclofenac sodium 1 % Gel  Commonly known as:  VOLTAREN  - Apply 1 application topically 4 (four) times daily. Apply 4 grams to left shoulder and left knee  -      HYDROcodone-acetaminophen 5-325 MG per tablet  Commonly known as:  NORCO/VICODIN  Take 1 tablet by mouth every 6 (six) hours as needed for pain.     hydrOXYzine 25 MG tablet  Commonly known as:  ATARAX/VISTARIL  Take 25 mg by mouth 3 (three) times daily as needed. anxiety     lansoprazole 30 MG capsule  Commonly known as:  PREVACID  Take 30 mg by mouth 2 (two) times daily.     levothyroxine 112 MCG tablet  Commonly known as:  SYNTHROID, LEVOTHROID  Take 112 mcg by mouth daily before breakfast.     MAXZIDE-25 37.5-25 MG  per tablet  Generic drug:  triamterene-hydrochlorothiazide  Take 1 tablet by mouth daily.     multivitamin tablet  Take 1 tablet by mouth daily.     paliperidone 3 MG 24 hr tablet  Commonly known as:  INVEGA  Take 1.5 mg by mouth at bedtime.     PARoxetine 20 MG tablet  Commonly known as:  PAXIL  Take 20 mg by mouth every morning.     polyethylene glycol packet  Commonly known as:  MIRALAX / GLYCOLAX  Take 17 g by mouth 2 (two) times daily.     sennosides-docusate sodium 8.6-50 MG tablet  Commonly known as:  SENOKOT-S  Take 2 tablets by mouth at bedtime.     traZODone 50 MG tablet  Commonly known as:  DESYREL  Take 25 mg by mouth at bedtime.        Allergies:  Allergies  Allergen Reactions  . Lithium   . Penicillins   . Sulfa Antibiotics     Past Medical History  Diagnosis Date  . Thyroid cancer   . Ulcer     chronic  . Depression   . Vitamin D  deficiency   . Anxiety   . Malaise and fatigue   . Osteoarthritis   . Kidney disease   . Pneumonia   . Dementia   . Alzheimer disease   . Hypothyroidism   . Bipolar 1 disorder   . Dehydration   . Hypertension   . Diabetes mellitus     Past Surgical History  Procedure Laterality Date  . Esophagogastroduodenoscopy  02/23/2011    Procedure: ESOPHAGOGASTRODUODENOSCOPY (EGD);  Surgeon: Theda Belfast, MD;  Location: [facility] ENDOSCOPY;  Service: Endoscopy;  Laterality: N/A;  . Abdominal hysterectomy    . Foot surgery    . Retinal detachment surgery    . Thyroidectomy      No family history on file.  Social History:  reports that she has never smoked. She does not have any smokeless tobacco history on file. She reports that she does not drink alcohol or use illicit drugs.  REVIEW of systems: She has had persistent and severe depression along with dementia She is unable to walk and is in a wheelchair She has a history of gastroesophageal reflux History of osteoarthritis History of constipation No history of coronary artery disease   BP 128/76  Pulse 99  SpO2 98%  Her mucous membranes appear to be moist No pedal edema present  Assessment/Plan:  Diabetes insipidus: She probably has better results with higher dose of DDAVP and last sodium was 139. Difficult to assess symptoms and she has persistent thirst and not clear if some of this is related to medications on psychogenic Also her urine was relatively concentrated at 1012 on the last evaluation Hypothyroidism: TSH to be checked Hypokalemia: She had a low level in the ER and will need to recheck this and supplement if needed Hypertension: This is mild and well-controlled Vitamin D deficiency: She  needs to be on supplement, currently vitamin D level in the system and unable to order this per Medicare guidelines. Will order on the next visit  Office Visit on 07/21/2012  Component Date Value Range Status  . Sodium 07/21/2012 140   135 - 145 mEq/L Final  . Potassium 07/21/2012 3.6  3.5 - 5.1 mEq/L Final  . Chloride 07/21/2012 101  96 - 112 mEq/L Final  . CO2 07/21/2012 27  19 - 32 mEq/L Final  . Glucose, Bld 07/21/2012 137*  70 - 99 mg/dL Final  . BUN 60/45/4098 25* 6 - 23 mg/dL Final  . Creatinine, Ser 07/21/2012 1.1  0.4 - 1.2 mg/dL Final  . Total Bilirubin 07/21/2012 0.4  0.3 - 1.2 mg/dL Final  . Alkaline Phosphatase 07/21/2012 94  39 - 117 U/L Final  . AST 07/21/2012 23  0 - 37 U/L Final  . ALT 07/21/2012 15  0 - 35 U/L Final  . Total Protein 07/21/2012 8.1  6.0 - 8.3 g/dL Final  . Albumin 11/91/4782 3.7  3.5 - 5.2 g/dL Final  . Calcium 95/62/1308 9.5  8.4 - 10.5 mg/dL Final  . GFR 65/78/4696 49.21* >60.00 mL/min Final  . TSH 07/21/2012 3.33  0.35 - 5.50 uIU/mL Final  . Free T4 07/21/2012 1.14  0.60 - 1.60 ng/dL Final

## 2012-07-30 ENCOUNTER — Non-Acute Institutional Stay (SKILLED_NURSING_FACILITY): Payer: Medicare Other | Admitting: Internal Medicine

## 2012-07-30 DIAGNOSIS — E039 Hypothyroidism, unspecified: Secondary | ICD-10-CM

## 2012-07-30 DIAGNOSIS — M1990 Unspecified osteoarthritis, unspecified site: Secondary | ICD-10-CM

## 2012-07-30 DIAGNOSIS — F319 Bipolar disorder, unspecified: Secondary | ICD-10-CM

## 2012-07-30 DIAGNOSIS — K219 Gastro-esophageal reflux disease without esophagitis: Secondary | ICD-10-CM

## 2012-07-30 DIAGNOSIS — E232 Diabetes insipidus: Secondary | ICD-10-CM

## 2012-07-30 DIAGNOSIS — I10 Essential (primary) hypertension: Secondary | ICD-10-CM

## 2012-07-30 NOTE — Progress Notes (Signed)
Patient ID: [name], female   DOB: April 20, 1934, 78 y.o.   MRN: [id number] Location:  Location:  [facility] Provider:  Gwenith Spitz. Renato Gails, D.O., C.M.D.  Code Status:  Full code Chief Complaint: med mgt chronic diseases HPI:  78 yo female with h/o dementia, severe longstanding major depression, diabetes insipidus from prior lithium therapy, failure to thrive with severe protein calorie malnutrition, arthritis, osteoporosis, and vit D deficiency was seen for medical mgt of chronic disease.  She is unchanged from her baseline today.    Review of Systems:  Review of Systems  Constitutional: Positive for malaise/fatigue. Negative for fever and chills.  HENT: Negative for congestion.   Eyes: Negative for blurred vision.  Respiratory: Negative for shortness of breath.   Cardiovascular: Negative for chest pain and leg swelling.  Gastrointestinal: Negative for abdominal pain and constipation.  Genitourinary: Negative for dysuria.  Musculoskeletal: Negative for falls and myalgias.  Skin: Negative for rash.  Neurological: Positive for weakness. Negative for dizziness and loss of consciousness.  Endo/Heme/Allergies: Bruises/bleeds easily.  Psychiatric/Behavioral: Positive for depression and memory loss. Negative for suicidal ideas.     Medications: Patient's Medications  New Prescriptions   No medications on file  Previous Medications   ALPRAZOLAM (XANAX) 0.25 MG TABLET    Take 0.25 mg by mouth 3 (three) times daily as needed. anxiety   AMLODIPINE (NORVASC) 5 MG TABLET    Take 5 mg by mouth daily.   ATORVASTATIN (LIPITOR) 10 MG TABLET    Take 10 mg by mouth at bedtime.    CALCIUM-VITAMIN D (OSCAL WITH D) 500-200 MG-UNIT PER TABLET    Take 2 tablets by mouth daily.    CHOLECALCIFEROL (VITAMIN D) 1000 UNITS TABLET    Take 1,000 Units by mouth daily.   CLOBETASOL CREAM (TEMOVATE) 0.05 %       DESMOPRESSIN (DDAVP) 0.2 MG TABLET    Take 0.2 mg by mouth 2 (two) times daily. Take 2  tabs twice daily   DICLOFENAC SODIUM (VOLTAREN) 1 % GEL    Apply 1 application topically 4 (four) times daily. Apply 4 grams to left shoulder and left knee    HYDROCODONE-ACETAMINOPHEN (NORCO/VICODIN) 5-325 MG PER TABLET    Take 1 tablet by mouth every 6 (six) hours as needed for pain.   HYDROXYZINE (ATARAX/VISTARIL) 25 MG TABLET    Take 25 mg by mouth 3 (three) times daily as needed. anxiety   LANSOPRAZOLE (PREVACID) 30 MG CAPSULE    Take 30 mg by mouth 2 (two) times daily.   LEVOTHYROXINE (SYNTHROID, LEVOTHROID) 112 MCG TABLET    Take 112 mcg by mouth daily before breakfast.   MULTIPLE VITAMIN (MULTIVITAMIN) TABLET    Take 1 tablet by mouth daily.     PALIPERIDONE (INVEGA) 3 MG 24 HR TABLET    Take 1.5 mg by mouth at bedtime.    PAROXETINE (PAXIL) 20 MG TABLET    Take 20 mg by mouth every morning.   POLYETHYLENE GLYCOL (MIRALAX / GLYCOLAX) PACKET    Take 17 g by mouth 2 (two) times daily.   SENNOSIDES-DOCUSATE SODIUM (SENOKOT-S) 8.6-50 MG TABLET    Take 2 tablets by mouth at bedtime.   TRAZODONE (DESYREL) 50 MG TABLET    Take 25 mg by mouth at bedtime.    TRIAMTERENE-HYDROCHLOROTHIAZIDE (MAXZIDE-25) 37.5-25 MG PER TABLET    Take 1 tablet by mouth daily.  Modified Medications   No medications on file  Discontinued Medications   No medications on file  Physical Exam: There were no vitals filed for this visit. Physical Exam  Constitutional: No distress.  HENT:  Head: Normocephalic.  Cardiovascular: Normal rate, regular rhythm, normal heart sounds and intact distal pulses.   Pulmonary/Chest: Effort normal and breath sounds normal. No respiratory distress. She has no wheezes.  Abdominal: Soft. Bowel sounds are normal. She exhibits no distension and no mass. There is no tenderness.  Musculoskeletal: Normal range of motion.  Neurological: She is alert.  Confused, always says she feels badly and is depressed and has abdominal pain  Skin: Skin is warm and dry.  Psychiatric:  Flat affect  unchanged     Labs reviewed: 06/04/12:  hba1c 7.8 07/14/12:  UA negative  Basic Metabolic Panel:  Recent Labs  78/29/56  09/08/11 0556 07/11/12 1240 07/21/12 1210  NA 142  < > 147* 139 140  K 3.9  < > 4.5 3.1* 3.6  CL 107  < > 116* 97 101  CO2 25  < > 23 28 27   GLUCOSE 121*  < > 84 131* 137*  BUN 22  < > 15 30* 25*  CREATININE 0.90  < > 0.95 1.21* 1.1  CALCIUM 9.1  < > 8.6 10.0 9.5  MG 2.2  --   --   --   --   PHOS 3.2  --   --   --   --   < > = values in this interval not displayed.  Liver Function Tests:  Recent Labs  09/07/11 0825 07/11/12 1240 07/21/12 1210  AST 17 23 23   ALT 8 15 15   ALKPHOS 84 111 94  BILITOT 0.4 0.3 0.4  PROT 6.8 7.9 8.1  ALBUMIN 3.1* 3.4* 3.7    CBC:  Recent Labs  09/06/11 1959 09/07/11 09/07/11 0825  09/07/11 2232 09/08/11 0556 07/11/12 1240  WBC 7.1 7.4 5.7  --   --   --  10.3  NEUTROABS 4.9 5.0  --   --   --   --   --   HGB 7.3* 7.8* 9.1*  < > 8.8* 8.7* 13.9  HCT 27.2* 28.7* 32.3*  < > 29.9* 30.6* 42.1  MCV 62.0* 62.0* 66.7*  --   --   --  92.1  PLT 320 291 PLATELET CLUMPS NOTED ON SMEAR, COUNT APPEARS ADEQUATE  --   --   --  269  < > = values in this interval not displayed.  Significant Diagnostic Results:  06/02/12 left ankle xrays with moderate diffuse osteopenia, mild to moderate degenerative joint space narrowing, mild degenerative irregularity at the medial malleolus, mild soft tissue swelling seen diffusely, fusion midfoot and hindfoot with old traumatic and postsurgical deformity seen and orthopedic hardware in place, no osteomyelitis  06/04/12 venous doppler negative for dvt  Assessment/Plan 1. Diabetes insipidus -stable with ddavp  2. Manic depressive disorder -difficult to manage--cont paxil, trazodone for sleep also, invega for psychosis -sees outpatient psychiatrist for this outside of the snf  3. Essential hypertension, benign -bp controlled with current therapy  4. Osteoarthritis -cont voltaren gel  5.  GERD (gastroesophageal reflux disease) -cont prevacid  6. Hypothyroidism -cont synthroid and cont to monitor tsh  Goals of care: full code  Labs/tests ordered:  Cbc, bmp next draw

## 2012-08-08 ENCOUNTER — Telehealth: Payer: Self-pay | Admitting: *Deleted

## 2012-08-08 NOTE — Telephone Encounter (Signed)
Tried getting in touch with patient's daughter with results, but no answer

## 2012-08-08 NOTE — Telephone Encounter (Signed)
Message copied by Hermenia Bers on Fri Aug 08, 2012  4:53 PM ------      Message from: Reather Littler      Created: Tue Jul 22, 2012  9:46 AM       Sodium and thyroid levels okay, please let the daughter know this ------

## 2012-08-18 ENCOUNTER — Telehealth: Payer: Self-pay | Admitting: *Deleted

## 2012-08-18 NOTE — Telephone Encounter (Signed)
Daughter is aware and asks that a copy be faxed to Dr. Jennelle Human, fax number [phone], fax has been sent already

## 2012-08-18 NOTE — Telephone Encounter (Signed)
Message copied by Hermenia Bers on Mon Aug 18, 2012  2:09 PM ------      Message from: Reather Littler      Created: Tue Jul 22, 2012  9:46 AM       Sodium and thyroid levels okay, please let the daughter know this ------

## 2012-08-26 ENCOUNTER — Encounter: Payer: Self-pay | Admitting: Internal Medicine

## 2012-09-19 ENCOUNTER — Non-Acute Institutional Stay (SKILLED_NURSING_FACILITY): Payer: Medicare Other | Admitting: Nurse Practitioner

## 2012-09-19 DIAGNOSIS — E559 Vitamin D deficiency, unspecified: Secondary | ICD-10-CM

## 2012-09-19 DIAGNOSIS — E232 Diabetes insipidus: Secondary | ICD-10-CM

## 2012-09-19 DIAGNOSIS — F0390 Unspecified dementia without behavioral disturbance: Secondary | ICD-10-CM

## 2012-09-19 DIAGNOSIS — F319 Bipolar disorder, unspecified: Secondary | ICD-10-CM

## 2012-09-19 DIAGNOSIS — I10 Essential (primary) hypertension: Secondary | ICD-10-CM

## 2012-09-19 DIAGNOSIS — E039 Hypothyroidism, unspecified: Secondary | ICD-10-CM

## 2012-09-21 ENCOUNTER — Encounter: Payer: Self-pay | Admitting: Nurse Practitioner

## 2012-09-21 NOTE — Progress Notes (Signed)
Patient ID: [name], female   DOB: 1934-07-21, 78 y.o.   MRN: [id number]   PCP: Bufford Spikes, DO   Allergies  Allergen Reactions  . Lithium   . Penicillins   . Sulfa Antibiotics     Chief Complaint  Patient presents with  . Medical Managment of Chronic Issues    HPI:  78 year old female with a pmh of diabetes insipidus, thyroid disorder, depression, GERD, constipation, dyslipidemia and manic depressive disorder is seen today for routine follow up. Pt is a poor historian due to advanced dementia but does not have any complaints and nursing is without concerns at this time.   Diabetes insipidus- on ddpv 0.2 mg (2 tabs) twice daily and follows with endocrine Depressive disorder, not elsewhere classified stable will continue paxil 20 mg daily and xanax 0.25 mg three times daily as needed for anxiety GERD (gastroesophageal reflux disease) prevacid 30 mg daily  Unspecified constipation miralax twice daily; senna s 2 tabs daily  Essential hypertension, benign  Stable on maxzide 25  daily and norvasc 5 mg daily  Unspecified hypothyroidism on synthroid 112 mcg daily  Dyslipidemia on lipitor 10 mg daily  Manic depressive disorder currently stable on invega 1.5 mg nightly will continue her atarax 25 mg three times daily as needed for anxiety and her trazodone 25 mg nightly for sleep  Osteoarthritis uses brace for left ankle; stable on  vicodin 5/325 mg every 6 hours as needed  Review of Systems:  Unable to obtain due to dementia   Past Medical History  Diagnosis Date  . Thyroid cancer   . Ulcer     chronic  . Depression   . Vitamin D deficiency   . Anxiety   . Malaise and fatigue   . Osteoarthritis   . Kidney disease   . Pneumonia   . Dementia   . Alzheimer disease   . Hypothyroidism   . Bipolar 1 disorder   . Dehydration   . Hypertension   . Diabetes mellitus    Past Surgical History  Procedure Laterality Date  . Esophagogastroduodenoscopy  02/23/2011    Procedure:  ESOPHAGOGASTRODUODENOSCOPY (EGD);  Surgeon: Theda Belfast, MD;  Location: [facility] ENDOSCOPY;  Service: Endoscopy;  Laterality: N/A;  . Abdominal hysterectomy    . Foot surgery    . Retinal detachment surgery    . Thyroidectomy     Social History:   reports that she has never smoked. She does not have any smokeless tobacco history on file. She reports that she does not drink alcohol or use illicit drugs.  No family history on file.  Medications: Patient's Medications  New Prescriptions   No medications on file  Previous Medications   ALPRAZOLAM (XANAX) 0.25 MG TABLET    Take 0.25 mg by mouth 3 (three) times daily as needed. anxiety   AMLODIPINE (NORVASC) 5 MG TABLET    Take 5 mg by mouth daily.   ATORVASTATIN (LIPITOR) 10 MG TABLET    Take 10 mg by mouth at bedtime.    CALCIUM-VITAMIN D (OSCAL WITH D) 500-200 MG-UNIT PER TABLET    Take 2 tablets by mouth daily.    CHOLECALCIFEROL (VITAMIN D) 1000 UNITS TABLET    Take 1,000 Units by mouth daily.   CLOBETASOL CREAM (TEMOVATE) 0.05 %       DESMOPRESSIN (DDAVP) 0.2 MG TABLET    Take 0.2 mg by mouth 2 (two) times daily. Take 2 tabs twice daily   DICLOFENAC SODIUM (VOLTAREN) 1 %  GEL    Apply 1 application topically 4 (four) times daily. Apply 4 grams to left shoulder and left knee    HYDROCODONE-ACETAMINOPHEN (NORCO/VICODIN) 5-325 MG PER TABLET    Take 1 tablet by mouth every 6 (six) hours as needed for pain.   HYDROXYZINE (ATARAX/VISTARIL) 25 MG TABLET    Take 10 mg by mouth every 12 (twelve) hours as needed. anxiety   LANSOPRAZOLE (PREVACID) 30 MG CAPSULE    Take 30 mg by mouth 2 (two) times daily.   LEVOTHYROXINE (SYNTHROID, LEVOTHROID) 112 MCG TABLET    Take 112 mcg by mouth daily before breakfast.   MULTIPLE VITAMIN (MULTIVITAMIN) TABLET    Take 1 tablet by mouth daily.     PALIPERIDONE (INVEGA) 3 MG 24 HR TABLET    Take 1.5 mg by mouth at bedtime.    PAROXETINE (PAXIL) 20 MG TABLET    Take 20 mg by mouth every morning.   POLYETHYLENE  GLYCOL (MIRALAX / GLYCOLAX) PACKET    Take 17 g by mouth 2 (two) times daily.   SENNOSIDES-DOCUSATE SODIUM (SENOKOT-S) 8.6-50 MG TABLET    Take 2 tablets by mouth at bedtime.   TRAZODONE (DESYREL) 50 MG TABLET    Take 25 mg by mouth at bedtime.    TRIAMTERENE-HYDROCHLOROTHIAZIDE (MAXZIDE-25) 37.5-25 MG PER TABLET    Take 1 tablet by mouth daily.  Modified Medications   No medications on file  Discontinued Medications   No medications on file     Physical Exam:  Filed Vitals:   09/19/12 1005  BP: 132/70  Pulse: 80  Temp: 98.2 F (36.8 C)  Resp: 20  Weight: 158 lb (71.668 kg)    GENERAL APPEARANCE: Alert, conversant. Appropriately groomed. No acute distress.  SKIN: No diaphoresis rash, or wounds HEAD: Normocephalic, atraumatic  EYES: Conjunctiva/lids clear. Pupils round, reactive. EOMs intact.  EARS: External exam WNL, Hearing grossly normal.  NOSE: No deformity or discharge.  MOUTH/THROAT: Lips w/o lesions. Mouth and throat normal. Tongue moist, w/o lesion.  NECK: No thyroid tenderness, enlargement or nodule  RESPIRATORY: Breathing is even, unlabored. Lung sounds are clear   CARDIOVASCULAR: Heart RRR no murmurs, rubs or gallops. No peripheral edema.  ARTERIAL: radial pulse 2+ GASTROINTESTINAL: Abdomen is soft, non-tender, not distended w/ normal bowel sounds.  GENITOURINARY: Bladder non tender, not distended  MUSCULOSKELETAL: No abnormal joints or musculature NEUROLOGIC: Oriented to self. Moves all extremities no tremor. PSYCHIATRIC: no behavioral issues   Labs reviewed: Basic Metabolic Panel:  Recent Labs  13/08/65 1240 07/21/12 1210  NA 139 140  K 3.1* 3.6  CL 97 101  CO2 28 27  GLUCOSE 131* 137*  BUN 30* 25*  CREATININE 1.21* 1.1  CALCIUM 10.0 9.5   Liver Function Tests:  Recent Labs  07/11/12 1240 07/21/12 1210  AST 23 23  ALT 15 15  ALKPHOS 111 94  BILITOT 0.3 0.4  PROT 7.9 8.1  ALBUMIN 3.4* 3.7   No results found for this basename: LIPASE,  AMYLASE,  in the last 8760 hours No results found for this basename: AMMONIA,  in the last 8760 hours CBC:  Recent Labs  07/11/12 1240  WBC 10.3  HGB 13.9  HCT 42.1  MCV 92.1  PLT 269   Cardiac Enzymes:  Recent Labs  07/11/12 1240  TROPONINI <0.30   BNP: No components found with this basename: POCBNP,  CBG: No results found for this basename: GLUCAP,  in the last 8760 hours   SIGNIFICANT DIAGNOSTIC EXAMS  03/15/2009- CT  Head: Age related cerebral atrophy, ventriculomegaly and periventricular white matter disease. no acute intracranial findings or mass lesions. 09-11-10: chest x-ray: . No acute cardiopulmonary disease. 2. Moderate hiatal hernia. 3. Extensive degenerative changes of the shoulders. 04-12-11: dexa: t score: -2.4 04-12-11: mammogram: negative   09-12-11  CT head: no acute intracranial abnormality, mass, lesions, or hydrocephalus. 09-13-11: ct of abdomen and pelvis: no acute process or explanation for blood in stool/anemia. cholelithiasis possible. constipation; moderate chronic hiatal hernia.   11-22-11: bone density: t score 0.3 06-02-12: left ankle x-ray: moderate diffuse osteopenia; mild to moderate degenerative joint space narrowing; mild degenerative irregularity at the medial malleolus; mild soft tissue swelling seen diffusely; there is fusion seen at the midfoot and hindfoot with old traumatic and postsurgical deformity seen and orthopedic hardware in place. No radiographic evidence for ostoemyelitis.   LAB REVIEWED:   07-02-11: glucose 125; bun 32; creat 1.02; k+ 5.2; na++ 149 08/31/11            CBC wbc 7.8, Hgb 7.2, Hct 276.1, plt 269 09-05-11            CBC wbc 7.0, Hgb 7.8, Hct 28.0, plt 351 9-4-13wbc 13.8; hgb 12.9; hct 31.4; plt 356 glucose 107; bun 27; creat 1.3; k+ 4.4; na++ 145    Akl phos 127, bilirubin 0.6, AST 60, ALT 56, total protein 7.9, albumin 3.8, TSH 1.043, Free T3 2.2, Free T4 1.27 9-11/13            CBC wbc 6.3, Hgb 9.3, Hct 29.8, plt  262 11-22-11: wbc 6.0; hgb 11.9; hct 35.6; mcv 85.3; plt 202; glucose 133; bun 30; creat 1.12; k+ 4.5 ;na++ 146 iron 47; tibc 233; ferritin 72 12-12-11: glucose 76; bun 21; creat 1.11; k+ 4.7; na++ 141 12-31-11: hgb a1c 6.9  02-18-12: wbc 5.8; hgb 13.5 ;hct 40.1; mcv 90.9; plt 228; glucose 108; bun 22; creat 1.08; k+ 4.5 ; na++ 152; liver normal albumin 3.7 02-26-12: wbc 6.4; hgb 13.6; hct 40.7; mcv 90.6; plt 203 04-14-12: glucose 151; bun 36; creat 1.28; k+ 3.9; na++147; liver normal albumin 4.2 Chol 288; ldl 176; trig 315      Assessment/Plan 1. Dementia Pt remains stable in current living environment will cont current medications  2. Diabetes insipidus -  lithium-induced Labs stable; conts to follow up with endocrine  3. Unspecified hypothyroidism Will follow up tsh  4. Unspecified vitamin D deficiency Will cont vit D  5. Essential hypertension, benign Patient is stable; continue current regimen. Will monitor and make changes as necessary.  6. Manic depressive disorder Remains stable on current medications; will follow up A1c

## 2012-10-15 ENCOUNTER — Non-Acute Institutional Stay (SKILLED_NURSING_FACILITY): Payer: Medicare Other | Admitting: Internal Medicine

## 2012-10-15 DIAGNOSIS — E232 Diabetes insipidus: Secondary | ICD-10-CM

## 2012-10-15 DIAGNOSIS — E039 Hypothyroidism, unspecified: Secondary | ICD-10-CM

## 2012-10-15 DIAGNOSIS — M1990 Unspecified osteoarthritis, unspecified site: Secondary | ICD-10-CM

## 2012-10-15 DIAGNOSIS — F0390 Unspecified dementia without behavioral disturbance: Secondary | ICD-10-CM

## 2012-10-15 DIAGNOSIS — D62 Acute posthemorrhagic anemia: Secondary | ICD-10-CM

## 2012-10-15 DIAGNOSIS — I10 Essential (primary) hypertension: Secondary | ICD-10-CM

## 2012-10-15 NOTE — Progress Notes (Signed)
Patient ID: [name], female   DOB: 1934/06/04, 78 y.o.   MRN: [id number] Location:  Location:  [facility] Provider:  Gwenith Spitz. Renato Gails, D.O., C.M.D.  Code Status:  DNR  Chief Complaint  Patient presents with  . Medical Managment of Chronic Issues  . Diabetes Mellitus    diabetic shoe request    HPI:  78 yo white female with h/o manic depressive disorder, dementia, htn, DMII, diabetes insipidus seen for medical mgt of her chronic diseases and due to request for diabetic shoes.  I have completed the paperwork for this more than once, and her records have been faxed to the supplier, but, for some reason, there remains a delay and her daughter is concerned.  She also requires special inserts with ankle supports.    When seen, pt c/o being depressed which she usually does even when she is smiling and jovial-appearing.  She denied pain.  She was resting during naptime.    Review of Systems:  Review of Systems  Constitutional: Negative for fever and chills.  Eyes: Negative for blurred vision.  Respiratory: Negative for shortness of breath.   Cardiovascular: Negative for chest pain.  Gastrointestinal: Negative for abdominal pain.  Genitourinary: Negative for dysuria.  Musculoskeletal: Negative for falls.  Neurological: Negative for dizziness and headaches.  Endo/Heme/Allergies: Negative for polydipsia.  Psychiatric/Behavioral: Positive for depression and memory loss.    Medications: Patient's Medications  New Prescriptions   No medications on file  Previous Medications   ALPRAZOLAM (XANAX) 0.25 MG TABLET    Take 0.25 mg by mouth 3 (three) times daily as needed. anxiety   AMLODIPINE (NORVASC) 5 MG TABLET    Take 5 mg by mouth daily.   ATORVASTATIN (LIPITOR) 10 MG TABLET    Take 10 mg by mouth at bedtime.    CALCIUM-VITAMIN D (OSCAL WITH D) 500-200 MG-UNIT PER TABLET    Take 2 tablets by mouth daily.    CHOLECALCIFEROL (VITAMIN D) 1000 UNITS TABLET    Take 1,000  Units by mouth daily.   CLOBETASOL CREAM (TEMOVATE) 0.05 %       DESMOPRESSIN (DDAVP) 0.2 MG TABLET    Take 0.2 mg by mouth 2 (two) times daily. Take 2 tabs twice daily   DICLOFENAC SODIUM (VOLTAREN) 1 % GEL    Apply 1 application topically 4 (four) times daily. Apply 4 grams to left shoulder and left knee    HYDROCODONE-ACETAMINOPHEN (NORCO/VICODIN) 5-325 MG PER TABLET    Take 1 tablet by mouth every 6 (six) hours as needed for pain.   HYDROXYZINE (ATARAX/VISTARIL) 25 MG TABLET    Take 10 mg by mouth every 12 (twelve) hours as needed. anxiety   LANSOPRAZOLE (PREVACID) 30 MG CAPSULE    Take 30 mg by mouth 2 (two) times daily.   LEVOTHYROXINE (SYNTHROID, LEVOTHROID) 112 MCG TABLET    Take 112 mcg by mouth daily before breakfast.   MULTIPLE VITAMIN (MULTIVITAMIN) TABLET    Take 1 tablet by mouth daily.     PALIPERIDONE (INVEGA) 3 MG 24 HR TABLET    Take 1.5 mg by mouth at bedtime.    PAROXETINE (PAXIL) 20 MG TABLET    Take 20 mg by mouth every morning.   POLYETHYLENE GLYCOL (MIRALAX / GLYCOLAX) PACKET    Take 17 g by mouth 2 (two) times daily.   SENNOSIDES-DOCUSATE SODIUM (SENOKOT-S) 8.6-50 MG TABLET    Take 2 tablets by mouth at bedtime.   TRAZODONE (DESYREL) 50 MG TABLET  Take 25 mg by mouth at bedtime.    TRIAMTERENE-HYDROCHLOROTHIAZIDE (MAXZIDE-25) 37.5-25 MG PER TABLET    Take 1 tablet by mouth daily.  Modified Medications   No medications on file  Discontinued Medications   No medications on file    Physical Exam: There were no vitals filed for this visit. Physical Exam  Nursing note and vitals reviewed. Constitutional: She appears well-developed and well-nourished.  HENT:  Head: Normocephalic and atraumatic.  Eyes:  Wears glasses  Cardiovascular: Normal rate, regular rhythm, normal heart sounds and intact distal pulses.   Pulmonary/Chest: Effort normal and breath sounds normal. No respiratory distress.  Abdominal: Soft. Bowel sounds are normal. She exhibits no distension.  There is no tenderness.  Musculoskeletal: Normal range of motion. She exhibits no edema and no tenderness.  Uses wheelchair  Neurological: She is alert.  Skin: There is pallor.     Labs reviewed: Basic Metabolic Panel:  Recent Labs  16/10/96 1240 07/21/12 1210  NA 139 140  K 3.1* 3.6  CL 97 101  CO2 28 27  GLUCOSE 131* 137*  BUN 30* 25*  CREATININE 1.21* 1.1  CALCIUM 10.0 9.5    Liver Function Tests:  Recent Labs  07/11/12 1240 07/21/12 1210  AST 23 23  ALT 15 15  ALKPHOS 111 94  BILITOT 0.3 0.4  PROT 7.9 8.1  ALBUMIN 3.4* 3.7    CBC:  Recent Labs  07/11/12 1240  WBC 10.3  HGB 13.9  HCT 42.1  MCV 92.1  PLT 269    Assessment/Plan 1. Essential hypertension, benign At goal with current therapy  2. Diabetes insipidus -  lithium-induced Avoid lithium Cont ddavp F/u with Dr. Lucianne Muss as planned  3. Unspecified hypothyroidism -due to h/o thyroid cancer s/p treatment -monitor tsh  4. Acute blood loss anemia -blood counts normalized with iron replacement  5. Dementia Moderate stages Requires assistance with bathing,dressing, able to feed herself  6. Osteoarthritis Controlled on current therapy   Family/ staff Communication: advised staff to notify [name] that all notes and prescriptions for diabetic shoes had been sent by medical records Goals of care: DNR Labs/tests ordered:  None ordered today

## 2012-10-17 ENCOUNTER — Encounter: Payer: Self-pay | Admitting: Internal Medicine

## 2012-10-21 ENCOUNTER — Ambulatory Visit: Payer: Medicare Other | Admitting: Endocrinology

## 2012-12-08 ENCOUNTER — Non-Acute Institutional Stay (SKILLED_NURSING_FACILITY): Payer: Medicare Other | Admitting: Nurse Practitioner

## 2012-12-08 DIAGNOSIS — E232 Diabetes insipidus: Secondary | ICD-10-CM

## 2012-12-08 DIAGNOSIS — M1990 Unspecified osteoarthritis, unspecified site: Secondary | ICD-10-CM

## 2012-12-08 DIAGNOSIS — I10 Essential (primary) hypertension: Secondary | ICD-10-CM

## 2012-12-08 DIAGNOSIS — E039 Hypothyroidism, unspecified: Secondary | ICD-10-CM

## 2012-12-08 DIAGNOSIS — K5900 Constipation, unspecified: Secondary | ICD-10-CM

## 2012-12-08 DIAGNOSIS — F319 Bipolar disorder, unspecified: Secondary | ICD-10-CM

## 2012-12-08 DIAGNOSIS — D649 Anemia, unspecified: Secondary | ICD-10-CM

## 2012-12-08 DIAGNOSIS — E559 Vitamin D deficiency, unspecified: Secondary | ICD-10-CM

## 2012-12-08 DIAGNOSIS — E785 Hyperlipidemia, unspecified: Secondary | ICD-10-CM

## 2012-12-08 DIAGNOSIS — F0390 Unspecified dementia without behavioral disturbance: Secondary | ICD-10-CM

## 2012-12-08 NOTE — Progress Notes (Signed)
Patient ID: [name], female   DOB: 02/03/1934, 78 y.o.   MRN: [id number]    Nursing Home Location:  [facility]   Place of Service: SNF (31)  PCP: REED, TIFFANY, DO  Allergies  Allergen Reactions  . Lithium   . Penicillins   . Sulfa Antibiotics     Chief Complaint  Patient presents with  . Medical Managment of Chronic Issues    HPI:  78 year old female with a pmh of diabetes insipidus, thyroid disorder, depression, GERD, constipation, dyslipidemia and manic depressive disorder is seen today for routine follow up. Pt is a poor historian due to advanced dementia and can not contribute to HPI/ROS but does not have any complaints and nursing is without concerns at this time.  Diabetes insipidus- on ddpv 0.2 mg (2 tabs) twice daily and follows with endocrine  Depressive disorder, not elsewhere classified stable will continue paxil 20 mg daily and xanax 0.25 mg three times daily as needed for anxiety no changes in behaviors GERD (gastroesophageal reflux disease) prevacid 30 mg daily  Unspecified constipation miralax twice daily; senna s 2 tabs daily  Essential hypertension, benign Stable on maxzide 25 daily and norvasc 5 mg daily  Unspecified hypothyroidism on synthroid 112 mcg daily  Dyslipidemia on lipitor 10 mg daily  Manic depressive disorder currently stable no behaviors noted on invega 1.5 mg nightly will continue her atarax 25 mg three times daily as needed for anxiety and her trazodone 25 mg nightly for sleep  Osteoarthritis uses brace for left ankle; stable on vicodin 5/325 mg every 6 hours as needed  Review of Systems:  Unable to obtain due to dementia   Past Medical History  Diagnosis Date  . Thyroid cancer   . Ulcer     chronic  . Depression   . Vitamin D deficiency   . Anxiety   . Malaise and fatigue   . Osteoarthritis   . Kidney disease   . Pneumonia   . Dementia   . Alzheimer disease   . Hypothyroidism   . Bipolar 1 disorder   .  Dehydration   . Hypertension   . Diabetes mellitus    Past Surgical History  Procedure Laterality Date  . Esophagogastroduodenoscopy  02/23/2011    Procedure: ESOPHAGOGASTRODUODENOSCOPY (EGD);  Surgeon: Theda Belfast, MD;  Location: [facility] ENDOSCOPY;  Service: Endoscopy;  Laterality: N/A;  . Abdominal hysterectomy    . Foot surgery    . Retinal detachment surgery    . Thyroidectomy     Social History:   reports that she has never smoked. She does not have any smokeless tobacco history on file. She reports that she does not drink alcohol or use illicit drugs.  No family history on file.  Medications: Patient's Medications  New Prescriptions   No medications on file  Previous Medications   ALPRAZOLAM (XANAX) 0.25 MG TABLET    Take 0.25 mg by mouth 3 (three) times daily as needed. anxiety   AMLODIPINE (NORVASC) 5 MG TABLET    Take 5 mg by mouth daily.   ATORVASTATIN (LIPITOR) 10 MG TABLET    Take 10 mg by mouth at bedtime.    CALCIUM-VITAMIN D (OSCAL WITH D) 500-200 MG-UNIT PER TABLET    Take 2 tablets by mouth daily.    CHOLECALCIFEROL (VITAMIN D) 1000 UNITS TABLET    Take 1,000 Units by mouth daily.   CLOBETASOL CREAM (TEMOVATE) 0.05 %       DESMOPRESSIN (DDAVP) 0.2  MG TABLET    Take 0.2 mg by mouth 2 (two) times daily. Take 2 tabs twice daily   DICLOFENAC SODIUM (VOLTAREN) 1 % GEL    Apply 1 application topically 4 (four) times daily. Apply 4 grams to left shoulder and left knee    HYDROCODONE-ACETAMINOPHEN (NORCO/VICODIN) 5-325 MG PER TABLET    Take 1 tablet by mouth every 6 (six) hours as needed for pain.   HYDROXYZINE (ATARAX/VISTARIL) 25 MG TABLET    Take 10 mg by mouth every 12 (twelve) hours as needed. anxiety   LANSOPRAZOLE (PREVACID) 30 MG CAPSULE    Take 30 mg by mouth 2 (two) times daily.   LEVOTHYROXINE (SYNTHROID, LEVOTHROID) 112 MCG TABLET    Take 112 mcg by mouth daily before breakfast.   MULTIPLE VITAMIN (MULTIVITAMIN) TABLET    Take 1 tablet by mouth daily.      PALIPERIDONE (INVEGA) 3 MG 24 HR TABLET    Take 1.5 mg by mouth at bedtime.    PAROXETINE (PAXIL) 20 MG TABLET    Take 20 mg by mouth every morning.   POLYETHYLENE GLYCOL (MIRALAX / GLYCOLAX) PACKET    Take 17 g by mouth 2 (two) times daily.   SENNOSIDES-DOCUSATE SODIUM (SENOKOT-S) 8.6-50 MG TABLET    Take 2 tablets by mouth at bedtime.   TRAZODONE (DESYREL) 50 MG TABLET    Take 25 mg by mouth at bedtime.    TRIAMTERENE-HYDROCHLOROTHIAZIDE (MAXZIDE-25) 37.5-25 MG PER TABLET    Take 1 tablet by mouth daily.  Modified Medications   No medications on file  Discontinued Medications   No medications on file     Physical Exam: Physical Exam  Nursing note and vitals reviewed. Constitutional: She appears well-developed and well-nourished.  HENT:  Head: Normocephalic and atraumatic.  Mouth/Throat: Oropharynx is clear and moist. No oropharyngeal exudate.  Eyes:  Wears glasses  Neck: Normal range of motion. Neck supple.  Cardiovascular: Normal rate, regular rhythm, normal heart sounds and intact distal pulses.   Pulmonary/Chest: Effort normal and breath sounds normal. No respiratory distress.  Abdominal: Soft. Bowel sounds are normal. She exhibits no distension. There is no tenderness.  Musculoskeletal: Normal range of motion. She exhibits no edema and no tenderness.  Uses wheelchair  Neurological: She is alert.  Skin: Skin is warm and dry.    Filed Vitals:   12/08/12 1007  BP: 116/68  Pulse: 80  Temp: 97.8 F (36.6 C)  Resp: 18      Labs reviewed: Basic Metabolic Panel:  Recent Labs  91/47/82 1240 07/21/12 1210  NA 139 140  K 3.1* 3.6  CL 97 101  CO2 28 27  GLUCOSE 131* 137*  BUN 30* 25*  CREATININE 1.21* 1.1  CALCIUM 10.0 9.5   Liver Function Tests:  Recent Labs  07/11/12 1240 07/21/12 1210  AST 23 23  ALT 15 15  ALKPHOS 111 94  BILITOT 0.3 0.4  PROT 7.9 8.1  ALBUMIN 3.4* 3.7   No results found for this basename: LIPASE, AMYLASE,  in the last 8760  hours No results found for this basename: AMMONIA,  in the last 8760 hours CBC:  Recent Labs  07/11/12 1240  WBC 10.3  HGB 13.9  HCT 42.1  MCV 92.1  PLT 269   Cardiac Enzymes:  Recent Labs  07/11/12 1240  TROPONINI <0.30   BNP: No components found with this basename: POCBNP,  CBG: No results found for this basename: GLUCAP,  in the last 8760 hours TSH:  Recent  Labs  07/21/12 1210  TSH 3.33   SIGNIFICANT DIAGNOSTIC EXAMS  03/15/2009- CT Head: Age related cerebral atrophy, ventriculomegaly and periventricular white matter disease. no acute intracranial findings or mass lesions.  09-11-10: chest x-ray: . No acute cardiopulmonary disease. 2. Moderate hiatal hernia. 3. Extensive degenerative changes of the shoulders.  04-12-11: dexa: t score: -2.4  04-12-11: mammogram: negative  09-12-11 CT head: no acute intracranial abnormality, mass, lesions, or hydrocephalus.  09-13-11: ct of abdomen and pelvis: no acute process or explanation for blood in stool/anemia. cholelithiasis possible. constipation; moderate chronic hiatal hernia.  11-22-11: bone density: t score 0.3  06-02-12: left ankle x-ray: moderate diffuse osteopenia; mild to moderate degenerative joint space narrowing; mild degenerative irregularity at the medial malleolus; mild soft tissue swelling seen diffusely; there is fusion seen at the midfoot and hindfoot with old traumatic and postsurgical deformity seen and orthopedic hardware in place. No radiographic evidence for ostoemyelitis.   LAB REVIEWED:  07-02-11: glucose 125; bun 32; creat 1.02; k+ 5.2; na++ 149  08/31/11 CBC wbc 7.8, Hgb 7.2, Hct 276.1, plt 269  09-05-11 CBC wbc 7.0, Hgb 7.8, Hct 28.0, plt 351  9-4-13wbc 13.8; hgb 12.9; hct 31.4; plt 356 glucose 107; bun 27; creat 1.3; k+ 4.4; na++ 145  Akl phos 127, bilirubin 0.6, AST 60, ALT 56, total protein 7.9, albumin 3.8,  TSH 1.043, Free T3 2.2, Free T4 1.27  9-11/13 CBC wbc 6.3, Hgb 9.3, Hct 29.8, plt 262  11-22-11: wbc  6.0; hgb 11.9; hct 35.6; mcv 85.3; plt 202; glucose 133; bun 30; creat 1.12; k+ 4.5 ;na++ 146  iron 47; tibc 233; ferritin 72  12-12-11: glucose 76; bun 21; creat 1.11; k+ 4.7; na++ 141  12-31-11: hgb a1c 6.9  02-18-12: wbc 5.8; hgb 13.5 ;hct 40.1; mcv 90.9; plt 228; glucose 108; bun 22; creat 1.08; k+ 4.5 ;  na++ 152; liver normal albumin 3.7  02-26-12: wbc 6.4; hgb 13.6; hct 40.7; mcv 90.6; plt 203  04-14-12: glucose 151; bun 36; creat 1.28; k+ 3.9; na++147; liver normal albumin 4.2  Chol 288; ldl 176; trig 315  08-06-12 wbc 8.8, rbc 4.46, hgb 13.4, hct 39.2 Sodium 143, potassium 3.4, glucose 134, Cr 1.05, BUN 24 09-22-12 TSH 2.44 hgb A1c 7.3   Assessment/Plan 1. Essential hypertension, benign Patient is stable; continue current regimen. Will monitor and make changes as necessary.  2. Unspecified constipation Remains stable on current medications  3. Unspecified hypothyroidism TSH normal; will cont current dose  4. Diabetes insipidus -  lithium-induced conts on ddpv 0.2 mg (2 tabs) twice daily and follows with endocrine  5. Dementia Stable in current environment   6. Osteoarthritis Stable on current medications  7. Anemia, unspecified Cbc reviewed; anemia stable  8. Unspecified vitamin D deficiency Will follow up vit d level with next labs  9. Manic Depressive disorder Mood has been stable; no issues per staff; cont current medications  10. Hyperlipidemia Elevated in April; will follow up fasting lipids at this time.   Labs- follow up cmp and vit d level and fasting lipids

## 2012-12-31 ENCOUNTER — Non-Acute Institutional Stay (SKILLED_NURSING_FACILITY): Payer: Medicare Other | Admitting: Internal Medicine

## 2012-12-31 DIAGNOSIS — R410 Disorientation, unspecified: Secondary | ICD-10-CM

## 2012-12-31 DIAGNOSIS — F0390 Unspecified dementia without behavioral disturbance: Secondary | ICD-10-CM

## 2012-12-31 DIAGNOSIS — R404 Transient alteration of awareness: Secondary | ICD-10-CM

## 2012-12-31 DIAGNOSIS — F319 Bipolar disorder, unspecified: Secondary | ICD-10-CM

## 2012-12-31 DIAGNOSIS — E232 Diabetes insipidus: Secondary | ICD-10-CM

## 2012-12-31 NOTE — Progress Notes (Signed)
Patient ID: [name], female   DOB: [birth year], 78 y.o.   MRN: [id number]  Location:  [name] Provider:  Gwenith Farmer. [name], D.O., C.M.D.  Code Status: full code  Chief Complaint  Patient presents with  . Acute Visit    increased lethargy and not swallowing pills    HPI:  78 yo female with h/o progressing dementia, depression, diabetes insipidus, hypothyroidism and osteoporosis was seen for an acute visit at the request of her daughter, [name].  [name] has noticed that over the past week, [name] has become more lethargic.  Apparently, she has been holding her pills in her mouth instead of swallowing them.  I discussed this with her nurse who says she gives them in yogurt.  When seen, pt's meds were in her mouth and when she spoke dry pieces of the tablets were coming out of her mouth.  We decided to crush the pills that can be crushed and make adjustments if needed to others.    Review of Systems:  Review of Systems  Constitutional: Positive for malaise/fatigue. Negative for fever and chills.  HENT: Negative for congestion.   Eyes: Negative for blurred vision.  Respiratory: Negative for shortness of breath.   Cardiovascular: Negative for chest pain.  Gastrointestinal: Negative for nausea, vomiting, abdominal pain, diarrhea and constipation.  Genitourinary: Negative for dysuria, urgency, frequency and flank pain.  Musculoskeletal: Negative for falls and myalgias.  Neurological: Positive for weakness. Negative for dizziness, focal weakness and headaches.  Psychiatric/Behavioral: Positive for depression and memory loss.       Pt always says, I feel bad, I am depressed.    Medications: Patient's Medications  New Prescriptions   No medications on file  Previous Medications   ALPRAZOLAM (XANAX) 0.25 MG TABLET    Take 0.25 mg by mouth 3 (three) times daily as needed. anxiety   AMLODIPINE (NORVASC) 5 MG TABLET    Take 5 mg by mouth daily.   ATORVASTATIN (LIPITOR) 10 MG  TABLET    Take 10 mg by mouth at bedtime.    CALCIUM-VITAMIN D (OSCAL WITH D) 500-200 MG-UNIT PER TABLET    Take 2 tablets by mouth daily.    CHOLECALCIFEROL (VITAMIN D) 1000 UNITS TABLET    Take 1,000 Units by mouth daily.   CLOBETASOL CREAM (TEMOVATE) 0.05 %       DESMOPRESSIN (DDAVP) 0.2 MG TABLET    Take 0.2 mg by mouth 2 (two) times daily. Take 2 tabs twice daily   DICLOFENAC SODIUM (VOLTAREN) 1 % GEL    Apply 1 application topically 4 (four) times daily. Apply 4 grams to left shoulder and left knee    HYDROCODONE-ACETAMINOPHEN (NORCO/VICODIN) 5-325 MG PER TABLET    Take 1 tablet by mouth every 6 (six) hours as needed for pain.   HYDROXYZINE (ATARAX/VISTARIL) 25 MG TABLET    Take 10 mg by mouth every 12 (twelve) hours as needed. anxiety   LANSOPRAZOLE (PREVACID) 30 MG CAPSULE    Take 30 mg by mouth 2 (two) times daily.   LEVOTHYROXINE (SYNTHROID, LEVOTHROID) 112 MCG TABLET    Take 112 mcg by mouth daily before breakfast.   MULTIPLE VITAMIN (MULTIVITAMIN) TABLET    Take 1 tablet by mouth daily.     PALIPERIDONE (INVEGA) 3 MG 24 HR TABLET    Take 1.5 mg by mouth at bedtime.    PAROXETINE (PAXIL) 20 MG TABLET    Take 20 mg by mouth every morning.   POLYETHYLENE GLYCOL (MIRALAX /  GLYCOLAX) PACKET    Take 17 g by mouth 2 (two) times daily.   SENNOSIDES-DOCUSATE SODIUM (SENOKOT-S) 8.6-50 MG TABLET    Take 2 tablets by mouth at bedtime.   TRAZODONE (DESYREL) 50 MG TABLET    Take 25 mg by mouth at bedtime.    TRIAMTERENE-HYDROCHLOROTHIAZIDE (MAXZIDE-25) 37.5-25 MG PER TABLET    Take 1 tablet by mouth daily.  Modified Medications   No medications on file  Discontinued Medications   No medications on file    Physical Exam: Physical Exam  Vitals reviewed. Constitutional: No distress.  Drowsy, but was able to answer questions as appropriately as is baseline, had pills still in her mouth  HENT:  Lips and mouth dry  Eyes: EOM are normal. Pupils are equal, round, and reactive to light.    Cardiovascular: Normal rate, regular rhythm, normal heart sounds and intact distal pulses.   Pulmonary/Chest: Effort normal and breath sounds normal. No respiratory distress.  Abdominal: Soft. Bowel sounds are normal. She exhibits no distension. There is no tenderness.  Genitourinary:  No suprapubic tenderness  Musculoskeletal: Normal range of motion. She exhibits no edema.  Neurological:  Drowsy, able to answer questions  Skin: Skin is warm and dry. There is pallor.     Labs reviewed: Basic Metabolic Panel:  Recent Labs  81/19/14 1240 07/21/12 1210  NA 139 140  K 3.1* 3.6  CL 97 101  CO2 28 27  GLUCOSE 131* 137*  BUN 30* 25*  CREATININE 1.21* 1.1  CALCIUM 10.0 9.5    Liver Function Tests:  Recent Labs  07/11/12 1240 07/21/12 1210  AST 23 23  ALT 15 15  ALKPHOS 111 94  BILITOT 0.3 0.4  PROT 7.9 8.1  ALBUMIN 3.4* 3.7    CBC:  Recent Labs  07/11/12 1240  WBC 10.3  HGB 13.9  HCT 42.1  MCV 92.1  PLT 269   Assessment/Plan 1. Delirium -felt to be due to a combination of receiving tamiflu which is well known to cause delirium and not receiving her medications fully due to pocketing them in her mouth b/c of progression of her dementia -nursing planned to begin crushing crushable meds and request new orders for those that could not be crushed--need to observe resident actually swallowing pills -also ordered cbc, bmp to be obtained stat to rule out infection in wbc, acute renal failure and abnormal sodium levels from missed ddavp---baseline creatinine is 1.05-1.2 range upon review of her prior labs  2. Dementia without behavioral disturbance -is progressing as discussed over the past few years with her daughter -code status had been revised and goals of care changed to a palliative approach, but it seems she is back to full code since her last hospital stay   3. Diabetes insipidus -check bmp to see sodium levels since pt has not been getting her meds  4.  Manic depressive disorder -chronic, on invega b/c of the severity of this, sees outpatient psychiatrist not facility psychiatrist, always says she is depressed even when symptoms are pretty well managed, has probably not been getting this either b/c she is not swallowing the pills  Family/ staff Communication: discussed plan with her daughter who was present during the visit  Goals of care: full code again since last return from the hospital  Labs/tests ordered:  Cbc and bmp next draw to reeval for infection, volume status and Na levels

## 2013-01-04 ENCOUNTER — Encounter: Payer: Self-pay | Admitting: Internal Medicine

## 2013-01-04 ENCOUNTER — Encounter (HOSPITAL_COMMUNITY): Payer: Self-pay

## 2013-01-04 ENCOUNTER — Emergency Department (HOSPITAL_COMMUNITY): Payer: Medicare Other

## 2013-01-04 ENCOUNTER — Inpatient Hospital Stay (HOSPITAL_COMMUNITY)
Admission: EM | Admit: 2013-01-04 | Discharge: 2013-01-09 | DRG: 689 | Disposition: A | Discharge status: Skilled Nursing Facility | Payer: Medicare Other | Attending: Internal Medicine | Admitting: Internal Medicine

## 2013-01-04 DIAGNOSIS — E870 Hyperosmolality and hypernatremia: Secondary | ICD-10-CM | POA: Diagnosis present

## 2013-01-04 DIAGNOSIS — L89109 Pressure ulcer of unspecified part of back, unspecified stage: Secondary | ICD-10-CM | POA: Diagnosis present

## 2013-01-04 DIAGNOSIS — E039 Hypothyroidism, unspecified: Secondary | ICD-10-CM

## 2013-01-04 DIAGNOSIS — A498 Other bacterial infections of unspecified site: Secondary | ICD-10-CM | POA: Diagnosis present

## 2013-01-04 DIAGNOSIS — F3289 Other specified depressive episodes: Secondary | ICD-10-CM

## 2013-01-04 DIAGNOSIS — E860 Dehydration: Secondary | ICD-10-CM

## 2013-01-04 DIAGNOSIS — M810 Age-related osteoporosis without current pathological fracture: Secondary | ICD-10-CM

## 2013-01-04 DIAGNOSIS — Z79899 Other long term (current) drug therapy: Secondary | ICD-10-CM

## 2013-01-04 DIAGNOSIS — Z6827 Body mass index (BMI) 27.0-27.9, adult: Secondary | ICD-10-CM

## 2013-01-04 DIAGNOSIS — F411 Generalized anxiety disorder: Secondary | ICD-10-CM | POA: Diagnosis present

## 2013-01-04 DIAGNOSIS — D638 Anemia in other chronic diseases classified elsewhere: Secondary | ICD-10-CM | POA: Diagnosis present

## 2013-01-04 DIAGNOSIS — D62 Acute posthemorrhagic anemia: Secondary | ICD-10-CM

## 2013-01-04 DIAGNOSIS — Z880 Allergy status to penicillin: Secondary | ICD-10-CM

## 2013-01-04 DIAGNOSIS — L8992 Pressure ulcer of unspecified site, stage 2: Secondary | ICD-10-CM | POA: Diagnosis present

## 2013-01-04 DIAGNOSIS — E785 Hyperlipidemia, unspecified: Secondary | ICD-10-CM

## 2013-01-04 DIAGNOSIS — E440 Moderate protein-calorie malnutrition: Secondary | ICD-10-CM | POA: Diagnosis present

## 2013-01-04 DIAGNOSIS — F319 Bipolar disorder, unspecified: Secondary | ICD-10-CM

## 2013-01-04 DIAGNOSIS — E119 Type 2 diabetes mellitus without complications: Secondary | ICD-10-CM | POA: Diagnosis present

## 2013-01-04 DIAGNOSIS — E876 Hypokalemia: Secondary | ICD-10-CM

## 2013-01-04 DIAGNOSIS — B9620 Unspecified Escherichia coli [E. coli] as the cause of diseases classified elsewhere: Secondary | ICD-10-CM | POA: Insufficient documentation

## 2013-01-04 DIAGNOSIS — N390 Urinary tract infection, site not specified: Secondary | ICD-10-CM

## 2013-01-04 DIAGNOSIS — Z862 Personal history of diseases of the blood and blood-forming organs and certain disorders involving the immune mechanism: Secondary | ICD-10-CM

## 2013-01-04 DIAGNOSIS — E232 Diabetes insipidus: Secondary | ICD-10-CM

## 2013-01-04 DIAGNOSIS — E559 Vitamin D deficiency, unspecified: Secondary | ICD-10-CM

## 2013-01-04 DIAGNOSIS — G9341 Metabolic encephalopathy: Secondary | ICD-10-CM | POA: Diagnosis present

## 2013-01-04 DIAGNOSIS — K5900 Constipation, unspecified: Secondary | ICD-10-CM

## 2013-01-04 DIAGNOSIS — C73 Malignant neoplasm of thyroid gland: Secondary | ICD-10-CM | POA: Diagnosis present

## 2013-01-04 DIAGNOSIS — L89152 Pressure ulcer of sacral region, stage 2: Secondary | ICD-10-CM

## 2013-01-04 DIAGNOSIS — M1990 Unspecified osteoarthritis, unspecified site: Secondary | ICD-10-CM

## 2013-01-04 DIAGNOSIS — E890 Postprocedural hypothyroidism: Secondary | ICD-10-CM

## 2013-01-04 DIAGNOSIS — T438X5A Adverse effect of other psychotropic drugs, initial encounter: Secondary | ICD-10-CM | POA: Diagnosis present

## 2013-01-04 DIAGNOSIS — F0390 Unspecified dementia without behavioral disturbance: Secondary | ICD-10-CM

## 2013-01-04 DIAGNOSIS — R4182 Altered mental status, unspecified: Secondary | ICD-10-CM

## 2013-01-04 DIAGNOSIS — I10 Essential (primary) hypertension: Secondary | ICD-10-CM | POA: Diagnosis present

## 2013-01-04 DIAGNOSIS — D649 Anemia, unspecified: Secondary | ICD-10-CM

## 2013-01-04 DIAGNOSIS — N179 Acute kidney failure, unspecified: Secondary | ICD-10-CM | POA: Diagnosis present

## 2013-01-04 DIAGNOSIS — F329 Major depressive disorder, single episode, unspecified: Secondary | ICD-10-CM

## 2013-01-04 DIAGNOSIS — G9340 Encephalopathy, unspecified: Secondary | ICD-10-CM

## 2013-01-04 DIAGNOSIS — K219 Gastro-esophageal reflux disease without esophagitis: Secondary | ICD-10-CM

## 2013-01-04 LAB — URINE MICROSCOPIC-ADD ON

## 2013-01-04 LAB — URINALYSIS, ROUTINE W REFLEX MICROSCOPIC
Bilirubin Urine: NEGATIVE
Glucose, UA: NEGATIVE mg/dL
Hgb urine dipstick: NEGATIVE
Ketones, ur: NEGATIVE mg/dL
Nitrite: POSITIVE — AB
Protein, ur: NEGATIVE mg/dL
Specific Gravity, Urine: 1.013 (ref 1.005–1.030)
Urobilinogen, UA: 1 mg/dL (ref 0.0–1.0)
pH: 6.5 (ref 5.0–8.0)

## 2013-01-04 LAB — CBC WITH DIFFERENTIAL/PLATELET
Basophils Absolute: 0 10*3/uL (ref 0.0–0.1)
Basophils Relative: 0 % (ref 0–1)
Eosinophils Absolute: 0 10*3/uL (ref 0.0–0.7)
Eosinophils Relative: 1 % (ref 0–5)
HCT: 42.8 % (ref 36.0–46.0)
Hemoglobin: 13.8 g/dL (ref 12.0–15.0)
Lymphocytes Relative: 8 % — ABNORMAL LOW (ref 12–46)
Lymphs Abs: 0.6 10*3/uL — ABNORMAL LOW (ref 0.7–4.0)
MCH: 29.3 pg (ref 26.0–34.0)
MCHC: 32.2 g/dL (ref 30.0–36.0)
MCV: 90.9 fL (ref 78.0–100.0)
Monocytes Absolute: 0.7 10*3/uL (ref 0.1–1.0)
Monocytes Relative: 9 % (ref 3–12)
Neutro Abs: 6.6 10*3/uL (ref 1.7–7.7)
Neutrophils Relative %: 82 % — ABNORMAL HIGH (ref 43–77)
Platelets: 335 10*3/uL (ref 150–400)
RBC: 4.71 MIL/uL (ref 3.87–5.11)
RDW: 14.5 % (ref 11.5–15.5)
WBC: 8 10*3/uL (ref 4.0–10.5)

## 2013-01-04 LAB — COMPREHENSIVE METABOLIC PANEL
ALT: 7 U/L (ref 0–35)
AST: 15 U/L (ref 0–37)
Albumin: 3.2 g/dL — ABNORMAL LOW (ref 3.5–5.2)
Alkaline Phosphatase: 89 U/L (ref 39–117)
BUN: 25 mg/dL — ABNORMAL HIGH (ref 6–23)
CO2: 26 mEq/L (ref 19–32)
Calcium: 9 mg/dL (ref 8.4–10.5)
Chloride: 106 mEq/L (ref 96–112)
Creatinine, Ser: 1.28 mg/dL — ABNORMAL HIGH (ref 0.50–1.10)
GFR calc Af Amer: 46 mL/min — ABNORMAL LOW (ref >90)
GFR calc non Af Amer: 40 mL/min — ABNORMAL LOW (ref >90)
Glucose, Bld: 119 mg/dL — ABNORMAL HIGH (ref 70–99)
Potassium: 3.1 mEq/L — ABNORMAL LOW (ref 3.5–5.1)
Sodium: 144 mEq/L (ref 135–145)
Total Bilirubin: 0.5 mg/dL (ref 0.3–1.2)
Total Protein: 7.7 g/dL (ref 6.0–8.3)

## 2013-01-04 LAB — TROPONIN I: Troponin I: <0.3 ng/mL (ref <0.30)

## 2013-01-04 LAB — MRSA PCR SCREENING: MRSA by PCR: NEGATIVE

## 2013-01-04 LAB — CG4 I-STAT (LACTIC ACID): Lactic Acid, Venous: 1.29 mmol/L (ref 0.5–2.2)

## 2013-01-04 MED ORDER — ACETAMINOPHEN 325 MG PO TABS
650.0000 mg | ORAL_TABLET | Freq: Four times a day (QID) | ORAL | Status: DC | PRN
  Administered 2013-01-04 – 2013-01-07 (×2): 650 mg via ORAL
  Filled 2013-01-04 (×2): qty 2

## 2013-01-04 MED ORDER — SENNOSIDES-DOCUSATE SODIUM 8.6-50 MG PO TABS
2.0000 | ORAL_TABLET | Freq: Every day | ORAL | Status: DC
  Administered 2013-01-04 – 2013-01-08 (×5): 2 via ORAL
  Filled 2013-01-04 (×9): qty 2

## 2013-01-04 MED ORDER — SODIUM CHLORIDE 0.9 % IV SOLN
Freq: Once | INTRAVENOUS | Status: AC
  Administered 2013-01-04: 20 mL/h via INTRAVENOUS

## 2013-01-04 MED ORDER — POTASSIUM CHLORIDE 10 MEQ/100ML IV SOLN
10.0000 meq | INTRAVENOUS | Status: AC
  Administered 2013-01-04 (×4): 10 meq via INTRAVENOUS
  Filled 2013-01-04 (×4): qty 100

## 2013-01-04 MED ORDER — ONDANSETRON HCL 4 MG/2ML IJ SOLN: 4.0000 mg | Freq: Four times a day (QID) | INTRAMUSCULAR | Status: DC | PRN

## 2013-01-04 MED ORDER — SODIUM CHLORIDE 0.9 % IV SOLN: INTRAVENOUS | Status: DC

## 2013-01-04 MED ORDER — DEXTROSE 5 % IV SOLN
1.0000 g | Freq: Once | INTRAVENOUS | Status: AC
  Administered 2013-01-04: 1 g via INTRAVENOUS
  Filled 2013-01-04: qty 10

## 2013-01-04 MED ORDER — PALIPERIDONE ER 3 MG PO TB24: 1.5000 mg | ORAL_TABLET | Freq: Every day | ORAL | Status: DC

## 2013-01-04 MED ORDER — ALPRAZOLAM 0.25 MG PO TABS: 0.2500 mg | ORAL_TABLET | Freq: Three times a day (TID) | ORAL | Status: DC | PRN

## 2013-01-04 MED ORDER — ATORVASTATIN CALCIUM 10 MG PO TABS
10.0000 mg | ORAL_TABLET | Freq: Every day | ORAL | Status: DC
  Administered 2013-01-04 – 2013-01-08 (×5): 10 mg via ORAL
  Filled 2013-01-04 (×6): qty 1

## 2013-01-04 MED ORDER — DEXTROSE 5 % IV SOLN
1.0000 g | INTRAVENOUS | Status: DC
  Administered 2013-01-05 – 2013-01-08 (×4): 1 g via INTRAVENOUS
  Filled 2013-01-04 (×5): qty 10

## 2013-01-04 MED ORDER — TRAZODONE 25 MG HALF TABLET
25.0000 mg | ORAL_TABLET | Freq: Every day | ORAL | Status: DC
  Administered 2013-01-04 – 2013-01-08 (×5): 25 mg via ORAL
  Filled 2013-01-04 (×6): qty 1

## 2013-01-04 MED ORDER — HEPARIN SODIUM (PORCINE) 5000 UNIT/ML IJ SOLN
5000.0000 [IU] | Freq: Three times a day (TID) | INTRAMUSCULAR | Status: DC
  Administered 2013-01-04 – 2013-01-09 (×14): 5000 [IU] via SUBCUTANEOUS
  Filled 2013-01-04 (×17): qty 1

## 2013-01-04 MED ORDER — LATANOPROST 0.005 % OP SOLN
1.0000 [drp] | Freq: Every day | OPHTHALMIC | Status: DC
  Administered 2013-01-04 – 2013-01-08 (×5): 1 [drp] via OPHTHALMIC
  Filled 2013-01-04: qty 2.5

## 2013-01-04 MED ORDER — PANTOPRAZOLE SODIUM 40 MG PO TBEC
40.0000 mg | DELAYED_RELEASE_TABLET | Freq: Every day | ORAL | Status: DC
  Administered 2013-01-04 – 2013-01-09 (×6): 40 mg via ORAL
  Filled 2013-01-04 (×7): qty 1

## 2013-01-04 MED ORDER — SODIUM CHLORIDE 0.9 % IJ SOLN
3.0000 mL | Freq: Two times a day (BID) | INTRAMUSCULAR | Status: DC
Start: 2013-01-04 — End: 2013-01-09
  Administered 2013-01-04 – 2013-01-08 (×5): 3 mL via INTRAVENOUS

## 2013-01-04 MED ORDER — LEVOTHYROXINE SODIUM 112 MCG PO TABS
112.0000 ug | ORAL_TABLET | Freq: Every day | ORAL | Status: DC
  Administered 2013-01-05 – 2013-01-09 (×5): 112 ug via ORAL
  Filled 2013-01-04 (×6): qty 1

## 2013-01-04 MED ORDER — ONDANSETRON HCL 4 MG PO TABS: 4.0000 mg | ORAL_TABLET | Freq: Four times a day (QID) | ORAL | Status: DC | PRN

## 2013-01-04 MED ORDER — PALIPERIDONE ER 1.5 MG PO TB24
1.5000 mg | ORAL_TABLET | Freq: Every day | ORAL | Status: DC
  Administered 2013-01-04 – 2013-01-08 (×5): 1.5 mg via ORAL
  Filled 2013-01-04 (×7): qty 1

## 2013-01-04 MED ORDER — POTASSIUM CHLORIDE IN NACL 20-0.9 MEQ/L-% IV SOLN
1000.0000 mL | INTRAVENOUS | Status: DC
  Administered 2013-01-04 – 2013-01-05 (×2): 1000 mL via INTRAVENOUS
  Filled 2013-01-04 (×5): qty 1000

## 2013-01-04 MED ORDER — PAROXETINE HCL 20 MG PO TABS
20.0000 mg | ORAL_TABLET | Freq: Every day | ORAL | Status: DC
  Administered 2013-01-04 – 2013-01-09 (×6): 20 mg via ORAL
  Filled 2013-01-04 (×7): qty 1

## 2013-01-04 MED ORDER — ACETAMINOPHEN 650 MG RE SUPP: 650.0000 mg | Freq: Four times a day (QID) | RECTAL | Status: DC | PRN

## 2013-01-04 MED ORDER — DESMOPRESSIN ACETATE 0.2 MG PO TABS
0.2000 mg | ORAL_TABLET | Freq: Two times a day (BID) | ORAL | Status: DC
  Administered 2013-01-04 – 2013-01-07 (×6): 0.2 mg via ORAL
  Filled 2013-01-04 (×7): qty 1

## 2013-01-04 MED ORDER — ADULT MULTIVITAMIN W/MINERALS CH
1.0000 | ORAL_TABLET | Freq: Every day | ORAL | Status: DC
  Administered 2013-01-05 – 2013-01-09 (×4): 1 via ORAL
  Filled 2013-01-04 (×5): qty 1

## 2013-01-04 MED ORDER — SODIUM CHLORIDE 0.9 % IV BOLUS (SEPSIS)
1000.0000 mL | Freq: Once | INTRAVENOUS | Status: AC
  Administered 2013-01-04: 1000 mL via INTRAVENOUS

## 2013-01-04 NOTE — ED Notes (Signed)
Bed: ZO10 Expected date: 01/04/13 Expected time: 10:06 AM Means of arrival:  Comments: Lethargic

## 2013-01-04 NOTE — Progress Notes (Signed)
Clinical Social Work Department BRIEF PSYCHOSOCIAL ASSESSMENT 01/04/2013  Patient:  [name], [name]     Account Number:  [id number]     Admit date:  01/04/2013  Clinical Social Worker:  [name]  Date/Time:  01/04/2013 02:47 PM  Referred by:  RN  Date Referred:  01/04/2013 Referred for  Other - See comment   Other Referral:   Pt is 5 year resident of [facility]. Daughter, who is poa for health care and estate, would like to find alternative placement.   Interview type:  Patient Other interview type:   Pt's daughter, [name], was at bedside for interview.    PSYCHOSOCIAL DATA Living Status:  FACILITY Admitted from facility:  [facility] Level of care:  Skilled Nursing Facility Primary support name:  [name] Primary support relationship to patient:  CHILD, ADULT Degree of support available:   strong. [name] is poa for healthcare and estate. [name] present and engaged for interview and well-informed about care options.    CURRENT CONCERNS Current Concerns  Post-Acute Placement   Other Concerns:    SOCIAL WORK ASSESSMENT / PLAN CSW consulted by RN [name] to provide overview of how pt can switch SNFs from outside of hospital.    CSW met with pt and pt's daughter [name]. Pt not oriented during interview. Per daugther, pt is a resident at [facility] in skilled nursing. [name] confirmed.    Daugther states that she wants pt to move to another facility because she is dissatisfied with level of care. States that pt has been resident for over 5 years and that "it's time for a change." CSW provided supportive counseling. CSW provided overview of how change of facility process can be facilitated outside of hospital .CSW answered daugther's question around this process, and provided education as to instances when pt can be placed from hospital (qualifying stay) and when they cannot. Daughter was agreeable and engaged in  interview.    Plan: no other follow-up needed, unless pt has qualifying stay   Assessment/plan status:  Other - See comment Other assessment/ plan:   Should pt end up having a qualifying stay, do not send requests to [facility] and please contact daugther [name].   Information/referral to community resources:   none at this time    PATIENT'S/FAMILY'S RESPONSE TO PLAN OF CARE: Unable to assess pt's response to plan of care. Daugther thanked CSW for her time and concern.    [name], [phone]     ED CSW  4:05pm

## 2013-01-04 NOTE — Progress Notes (Signed)
Report called to Annie, RN.

## 2013-01-04 NOTE — Progress Notes (Signed)
1417 now clean. Call to ED for report.

## 2013-01-04 NOTE — ED Notes (Signed)
Pt's facility nurse, Kriste Basque, states that they have had an outbreak of flu A.  All the residents and the staff have been put on tamiflu prophylactically.  States that all of the residents have been sequestered in their own rooms for 3 wks.  States that she thinks pt is more lethargic because she is depressed from zero socialization and that the tamiflu is making her feel bad.

## 2013-01-04 NOTE — ED Provider Notes (Signed)
Medical screening examination/treatment/procedure(s) were conducted as a shared visit with non-physician practitioner(s) and myself.  I personally evaluated the patient during the encounter.  EKG Interpretation    Date/Time:  Sunday January 04 2013 11:25:59 EST Ventricular Rate:  82 PR Interval:  133 QRS Duration: 96 QT Interval:  408 QTC Calculation: 476 R Axis:   -84 Text Interpretation:  Sinus rhythm Left anterior fascicular block Low voltage, precordial leads Consider right ventricular hypertrophy No significant change was found Confirmed by Violet Cart  MD, Caryn Bee ([id number]) on 01/04/2013 1:02:36 PM            Admit for AMS and UTI. AMS likely secondary to delerium from UTI. Admit. Improving with fluids  Lyanne Co, MD 01/04/13 1505

## 2013-01-04 NOTE — H&P (Addendum)
[facility] History and Physical  Mike Berntsen NFA:[id number] DOB: 06-04-1934 DOA: 01/04/2013   PCP: Bufford Spikes, DO   Chief Complaint: mental status change  HPI:  78 year old female diabetes insipidus, thyroid cancer, bipolar disorder, hypertension, and dementia presents with one-week history of worsening mental status. The patient is unable to provide any history at this time due to her encephalopathy. All of this history is obtained by speaking with the patient's daughter at the bedside. Over the past week, the patient has had declining mental status to the point where there was difficulty waking the patient up this morning. As a result EMS was activated. According to the daughter, the patient has been receiving Tamiflu on what appears to be a prophylactic basis at her nursing facility, [facility]. The patient is on day #11 of 14. According to the daughter, the patient has not been tested for influenza, nor has she been told that she has had influenza. There has been no history of fever, coughing, shortness of breath, vomiting, diarrhea, abdominal pain. The patient's daughter also questions whether the patient has been getting her medications properly as a result of her encephalopathy over the last several days.  In ED, the patient was noted to have potassium 3.1, temperature 99.30F, WBC 8.0, lactic acid 1.29. Troponin was negative. EKG shows sinus rhythm with LAFB. The patient was given 1 L of normal saline after which the patient was a little more alert. She was given 1 dose of ceftriaxone. Assessment/Plan: Acute encephalopathy -Likely due to dehydration and UTI Dehydration -Clinically, patient appears volume depleted -Continue IV fluids -hold maxzide Pyuria -Continue empiric ceftriaxone pending culture data Hypokalemia -Replete -Check magnesium Hypothyroidism -Check TSH -The patient had complete thyroidectomy for thyroid cancer Diabetes insipidus -Continue DDAVP -pt  used to take lithium Hypertension -Hold amlodipine as the patient's blood pressure is soft Bipolar disorder -Continue Invega, trazadone, paxil Stage 2 sacral decubitus ulcer -present at time of admission -wound care nurse consult -not infected on exam     Past Medical History  Diagnosis Date  . Thyroid cancer   . Ulcer     chronic  . Depression   . Vitamin D deficiency   . Anxiety   . Malaise and fatigue   . Osteoarthritis   . Kidney disease   . Pneumonia   . Dementia   . Alzheimer disease   . Hypothyroidism   . Bipolar 1 disorder   . Dehydration   . Hypertension   . Diabetes mellitus    Past Surgical History  Procedure Laterality Date  . Esophagogastroduodenoscopy  02/23/2011    Procedure: ESOPHAGOGASTRODUODENOSCOPY (EGD);  Surgeon: Theda Belfast, MD;  Location: [facility] ENDOSCOPY;  Service: Endoscopy;  Laterality: N/A;  . Abdominal hysterectomy    . Foot surgery    . Retinal detachment surgery    . Thyroidectomy     Social History:  reports that she has never smoked. She does not have any smokeless tobacco history on file. She reports that she does not drink alcohol or use illicit drugs.   Family Hx:  Unobtainable secondary to mental status   Allergies  Allergen Reactions  . Lithium   . Penicillins   . Sulfa Antibiotics       Prior to Admission medications   Medication Sig Start Date End Date Taking? Authorizing Provider  ALPRAZolam (XANAX) 0.25 MG tablet Take 0.25 mg by mouth 3 (three) times daily as needed. anxiety   Yes Historical Provider, MD  amLODipine (NORVASC) 5 MG tablet  Take 5 mg by mouth daily.   Yes Historical Provider, MD  atorvastatin (LIPITOR) 10 MG tablet Take 10 mg by mouth at bedtime.    Yes Historical Provider, MD  bimatoprost (LUMIGAN) 0.03 % ophthalmic solution Place 1 drop into both eyes at bedtime.   Yes Historical Provider, MD  calcium-vitamin D (OSCAL WITH D) 500-200 MG-UNIT per tablet Take 2 tablets by mouth daily.    Yes Historical  Provider, MD  cholecalciferol (VITAMIN D) 1000 UNITS tablet Take 1,000 Units by mouth daily.   Yes Historical Provider, MD  desmopressin (DDAVP) 0.2 MG tablet Take 0.2 mg by mouth 2 (two) times daily. Take 2 tabs twice daily   Yes Historical Provider, MD  diclofenac sodium (VOLTAREN) 1 % GEL Apply 1 application topically 4 (four) times daily. Apply 4 grams to left shoulder and left knee    Yes Historical Provider, MD  hydrOXYzine (ATARAX/VISTARIL) 25 MG tablet Take 10 mg by mouth every 12 (twelve) hours as needed. anxiety   Yes Historical Provider, MD  lansoprazole (PREVACID) 30 MG capsule Take 30 mg by mouth 2 (two) times daily.   Yes Historical Provider, MD  levothyroxine (SYNTHROID, LEVOTHROID) 112 MCG tablet Take 112 mcg by mouth daily before breakfast.   Yes Historical Provider, MD  Multiple Vitamin (MULTIVITAMIN) tablet Take 1 tablet by mouth daily.     Yes Historical Provider, MD  oseltamivir (TAMIFLU) 75 MG capsule Take 75 mg by mouth daily.   Yes Historical Provider, MD  paliperidone (INVEGA) 3 MG 24 hr tablet Take 1.5 mg by mouth at bedtime.  02/24/11  Yes Henderson Cloud, MD  PARoxetine (PAXIL) 20 MG tablet Take 20 mg by mouth every morning.   Yes Historical Provider, MD  polyethylene glycol (MIRALAX / GLYCOLAX) packet Take 17 g by mouth 2 (two) times daily.   Yes Historical Provider, MD  sennosides-docusate sodium (SENOKOT-S) 8.6-50 MG tablet Take 2 tablets by mouth at bedtime.   Yes Historical Provider, MD  traZODone (DESYREL) 50 MG tablet Take 25 mg by mouth at bedtime.    Yes Historical Provider, MD  triamterene-hydrochlorothiazide (MAXZIDE-25) 37.5-25 MG per tablet Take 1 tablet by mouth daily.   Yes Historical Provider, MD  HYDROcodone-acetaminophen (NORCO/VICODIN) 5-325 MG per tablet Take 1 tablet by mouth every 6 (six) hours as needed for pain.    Historical Provider, MD    Review of Systems:  Unobtainable secondary to mental status Physical Exam: Filed Vitals:    01/04/13 1300 01/04/13 1315 01/04/13 1330 01/04/13 1400  BP: 117/68  105/68 113/62  Pulse:      Temp:      TempSrc:      Resp: 17 21 18    SpO2:       General:  Awakens to voice, NAD, nontoxic, pleasant/cooperative Head/Eye: No conjunctival hemorrhage, no icterus, [place]/AT, No nystagmus ENT:  No icterus,  No thrush, dry mucous membranes Neck:  No masses, no lymphadenpathy, no bruits CV:  RRR, no rub, no gallop, no S3 Lung:  CTAB, good air movement, no wheeze, no rhonchi Abdomen: soft/NT, +BS, nondistended, no peritoneal signs Ext: No cyanosis, No rashes, No petechiae, No lymphangitis, No edema   Labs on Admission:  Basic Metabolic Panel:  Recent Labs Lab 01/04/13 1236  NA 144  K 3.1*  CL 106  CO2 26  GLUCOSE 119*  BUN 25*  CREATININE 1.28*  CALCIUM 9.0   Liver Function Tests:  Recent Labs Lab 01/04/13 1236  AST 15  ALT 7  ALKPHOS 89  BILITOT 0.5  PROT 7.7  ALBUMIN 3.2*   No results found for this basename: LIPASE, AMYLASE,  in the last 168 hours No results found for this basename: AMMONIA,  in the last 168 hours CBC:  Recent Labs Lab 01/04/13 1236  WBC 8.0  NEUTROABS 6.6  HGB 13.8  HCT 42.8  MCV 90.9  PLT 335   Cardiac Enzymes:  Recent Labs Lab 01/04/13 1236  TROPONINI <0.30   BNP: No components found with this basename: POCBNP,  CBG: No results found for this basename: GLUCAP,  in the last 168 hours  Radiological Exams on Admission: Dg Chest Port 1 View  01/04/2013   CLINICAL DATA:  Dyspnea  EXAM: PORTABLE CHEST - 1 VIEW  COMPARISON:  PA and lateral chest of July 11, 2012.  FINDINGS: An AP semi-erect portable view of the chest reveals the lungs to be adequately inflated. There is no focal infiltrate. The cardiopericardial silhouette is mildly enlarged though stable. There is stable soft tissue prominence in the right peritracheal region. There is tortuosity of the descending thoracic aorta. There is no pleural effusion or pneumothorax. There are  surgical clips at the base of the neck. There are severe degenerative changes of the left shoulder.  IMPRESSION: There is no evidence of pneumonia. I cannot exclude low-grade compensated CHF in the appropriate clinical setting. When the patient can tolerate the procedure, a PA and lateral chest x-ray with deep inspiration would be of value.   Electronically Signed   By: Terriah Reggio  [place]   On: 01/04/2013 12:51    EKG: Independently reviewed. Sinus with LAFB    Time spent:60 minutes Code Status:   FULL Family Communication:   Daughter at bedside   Antoneo Ghrist, DO  [facility] Pager [phone]  If 7PM-7AM, please contact night-coverage [email or web address] Password [facility] 01/04/2013, 3:07 PM

## 2013-01-04 NOTE — ED Provider Notes (Signed)
CSN: [id number]     Arrival date & time 01/04/13  1009 History   First MD Initiated Contact with Patient 01/04/13 1014     Chief Complaint  Patient presents with  . Fatigue   (Consider location/radiation/quality/duration/timing/severity/associated sxs/prior Treatment) HPI Patient presents emergency department with altered mental status, weakness, and fever.  The patient, apparently has alterations in her mental status, over the last 2 days.  Patient is unable to provide much history to me as she is confused Past Medical History  Diagnosis Date  . Thyroid cancer   . Ulcer     chronic  . Depression   . Vitamin D deficiency   . Anxiety   . Malaise and fatigue   . Osteoarthritis   . Kidney disease   . Pneumonia   . Dementia   . Alzheimer disease   . Hypothyroidism   . Bipolar 1 disorder   . Dehydration   . Hypertension   . Diabetes mellitus    Past Surgical History  Procedure Laterality Date  . Esophagogastroduodenoscopy  02/23/2011    Procedure: ESOPHAGOGASTRODUODENOSCOPY (EGD);  Surgeon: Theda Belfast, MD;  Location: [facility] ENDOSCOPY;  Service: Endoscopy;  Laterality: N/A;  . Abdominal hysterectomy    . Foot surgery    . Retinal detachment surgery    . Thyroidectomy     No family history on file. History  Substance Use Topics  . Smoking status: Never Smoker   . Smokeless tobacco: Not on file  . Alcohol Use: No   OB History   Grav Para Term Preterm Abortions TAB SAB Ect Mult Living                 Review of Systems  level V caveat applies due to altered mental status Allergies  Lithium; Penicillins; and Sulfa antibiotics  Home Medications   Current Outpatient Rx  Name  Route  Sig  Dispense  Refill  . ALPRAZolam (XANAX) 0.25 MG tablet   Oral   Take 0.25 mg by mouth 3 (three) times daily as needed. anxiety         . amLODipine (NORVASC) 5 MG tablet   Oral   Take 5 mg by mouth daily.         Marland Kitchen atorvastatin (LIPITOR) 10 MG tablet   Oral   Take 10 mg by  mouth at bedtime.          . bimatoprost (LUMIGAN) 0.03 % ophthalmic solution   Both Eyes   Place 1 drop into both eyes at bedtime.         . calcium-vitamin D (OSCAL WITH D) 500-200 MG-UNIT per tablet   Oral   Take 2 tablets by mouth daily.          . cholecalciferol (VITAMIN D) 1000 UNITS tablet   Oral   Take 1,000 Units by mouth daily.         Marland Kitchen desmopressin (DDAVP) 0.2 MG tablet   Oral   Take 0.2 mg by mouth 2 (two) times daily. Take 2 tabs twice daily         . diclofenac sodium (VOLTAREN) 1 % GEL   Topical   Apply 1 application topically 4 (four) times daily. Apply 4 grams to left shoulder and left knee          . hydrOXYzine (ATARAX/VISTARIL) 25 MG tablet   Oral   Take 10 mg by mouth every 12 (twelve) hours as needed. anxiety         .  lansoprazole (PREVACID) 30 MG capsule   Oral   Take 30 mg by mouth 2 (two) times daily.         Marland Kitchen levothyroxine (SYNTHROID, LEVOTHROID) 112 MCG tablet   Oral   Take 112 mcg by mouth daily before breakfast.         . Multiple Vitamin (MULTIVITAMIN) tablet   Oral   Take 1 tablet by mouth daily.           Marland Kitchen oseltamivir (TAMIFLU) 75 MG capsule   Oral   Take 75 mg by mouth daily.         . paliperidone (INVEGA) 3 MG 24 hr tablet   Oral   Take 1.5 mg by mouth at bedtime.          Marland Kitchen PARoxetine (PAXIL) 20 MG tablet   Oral   Take 20 mg by mouth every morning.         . polyethylene glycol (MIRALAX / GLYCOLAX) packet   Oral   Take 17 g by mouth 2 (two) times daily.         . sennosides-docusate sodium (SENOKOT-S) 8.6-50 MG tablet   Oral   Take 2 tablets by mouth at bedtime.         . traZODone (DESYREL) 50 MG tablet   Oral   Take 25 mg by mouth at bedtime.          . triamterene-hydrochlorothiazide (MAXZIDE-25) 37.5-25 MG per tablet   Oral   Take 1 tablet by mouth daily.         Marland Kitchen HYDROcodone-acetaminophen (NORCO/VICODIN) 5-325 MG per tablet   Oral   Take 1 tablet by mouth every 6 (six)  hours as needed for pain.          BP 113/62  Pulse 75  Temp(Src) 99.5 F (37.5 C) (Rectal)  Resp 18  SpO2 96% Physical Exam  Nursing note and vitals reviewed. Constitutional: She appears well-developed and well-nourished. No distress.  HENT:  Head: Normocephalic and atraumatic.  Mouth/Throat: Uvula is midline. Mucous membranes are dry.  Eyes: Pupils are equal, round, and reactive to light.  Cardiovascular: Normal rate, regular rhythm and normal heart sounds.  Exam reveals no gallop and no friction rub.   No murmur heard. Pulmonary/Chest: Effort normal and breath sounds normal. No respiratory distress. She has no wheezes.  Abdominal: Soft. Bowel sounds are normal. She exhibits no distension. There is no tenderness.  Neurological: She is alert.  Skin: Skin is warm and dry. No rash noted. No erythema.    ED Course  Procedures (including critical care time) Labs Review Labs Reviewed  CBC WITH DIFFERENTIAL - Abnormal; Notable for the following:    Neutrophils Relative % 82 (*)    Lymphocytes Relative 8 (*)    Lymphs Abs 0.6 (*)    All other components within normal limits  COMPREHENSIVE METABOLIC PANEL - Abnormal; Notable for the following:    Potassium 3.1 (*)    Glucose, Bld 119 (*)    BUN 25 (*)    Creatinine, Ser 1.28 (*)    Albumin 3.2 (*)    GFR calc non Af Amer 40 (*)    GFR calc Af Amer 46 (*)    All other components within normal limits  URINALYSIS, ROUTINE W REFLEX MICROSCOPIC - Abnormal; Notable for the following:    APPearance CLOUDY (*)    Nitrite POSITIVE (*)    Leukocytes, UA LARGE (*)    All other components within normal  limits  URINE MICROSCOPIC-ADD ON - Abnormal; Notable for the following:    Bacteria, UA MANY (*)    All other components within normal limits  URINE CULTURE  TROPONIN I  CG4 I-STAT (LACTIC ACID)   Imaging Review Dg Chest Port 1 View  01/04/2013   CLINICAL DATA:  Dyspnea  EXAM: PORTABLE CHEST - 1 VIEW  COMPARISON:  PA and lateral  chest of July 11, 2012.  FINDINGS: An AP semi-erect portable view of the chest reveals the lungs to be adequately inflated. There is no focal infiltrate. The cardiopericardial silhouette is mildly enlarged though stable. There is stable soft tissue prominence in the right peritracheal region. There is tortuosity of the descending thoracic aorta. There is no pleural effusion or pneumothorax. There are surgical clips at the base of the neck. There are severe degenerative changes of the left shoulder.  IMPRESSION: There is no evidence of pneumonia. I cannot exclude low-grade compensated CHF in the appropriate clinical setting. When the patient can tolerate the procedure, a PA and lateral chest x-ray with deep inspiration would be of value.   Electronically Signed   By: David  [place]   On: 01/04/2013 12:51    EKG Interpretation    Date/Time:  Sunday January 04 2013 11:25:59 EST Ventricular Rate:  82 PR Interval:  133 QRS Duration: 96 QT Interval:  408 QTC Calculation: 476 R Axis:   -84 Text Interpretation:  Sinus rhythm Left anterior fascicular block Low voltage, precordial leads Consider right ventricular hypertrophy No significant change was found Confirmed by CAMPOS  MD, KEVIN ([id number]) on 01/04/2013 1:02:36 PM            MDM   1. UTI (urinary tract infection)   2. Dehydration   3. Altered mental status    patient be admitted to the hospital for further evaluation and care.  I spoke with the, [facility], who will be down to admit the patient.    Carlyle Dolly, PA-C 01/04/13 1501

## 2013-01-04 NOTE — ED Notes (Addendum)
Pt from [place] at [facility] via EMS. Pt acting her normal per facility, but pt daughter feels that pt has been more lethargic and less responsive x5 days and wants pt evaluated. Pt has hx of alzheimers and dementia. Pt in NAD

## 2013-01-05 DIAGNOSIS — D62 Acute posthemorrhagic anemia: Secondary | ICD-10-CM

## 2013-01-05 LAB — BASIC METABOLIC PANEL
CO2: 27 mEq/L (ref 19–32)
Calcium: 8.6 mg/dL (ref 8.4–10.5)
Chloride: 108 mEq/L (ref 96–112)
Creatinine, Ser: 1.24 mg/dL — ABNORMAL HIGH (ref 0.50–1.10)
GFR calc Af Amer: 48 mL/min — ABNORMAL LOW (ref >90)
Potassium: 3.5 mEq/L (ref 3.5–5.1)
Sodium: 143 mEq/L (ref 135–145)

## 2013-01-05 LAB — CBC
MCV: 92.8 fL (ref 78.0–100.0)
Platelets: 329 10*3/uL (ref 150–400)
RBC: 4.14 MIL/uL (ref 3.87–5.11)
WBC: 5.8 10*3/uL (ref 4.0–10.5)

## 2013-01-05 MED ORDER — BOOST / RESOURCE BREEZE PO LIQD
1.0000 | ORAL | Status: DC
  Administered 2013-01-06 – 2013-01-09 (×4): 1 via ORAL

## 2013-01-05 MED ORDER — ENSURE COMPLETE PO LIQD
237.0000 mL | Freq: Two times a day (BID) | ORAL | Status: DC
  Administered 2013-01-05 – 2013-01-09 (×8): 237 mL via ORAL

## 2013-01-05 NOTE — Progress Notes (Signed)
Patient ID: [name], female   DOB: Jan 20, 1934, 78 y.o.   MRN: [id number]  [facility] PROGRESS NOTE  [name] JXB:[id number] DOB: July 23, 1934 DOA: Jan 14, 2013 PCP: Erica Spikes, DO  Brief narrative: 78 year old female diabetes insipidus, thyroid cancer, bipolar disorder, hypertension, and dementia presents with one-week history of worsening mental status. The patient is unable to provide any history due to her encephalopathy. All of this history was obtained by speaking with the patient's daughter at the bedside. Over the past week, the patient has had declining mental status to the point where there was difficulty waking the patient up. As a result EMS was activated. According to the daughter, the patient has been receiving Tamiflu on what appears to be a prophylactic basis at her nursing facility, [name]. The patient on day #11 of 14 on admission date. According to the daughter, the patient has not been tested for influenza, nor has she been told that she has had influenza. There has been no history of fever, coughing, shortness of breath, vomiting, diarrhea, abdominal pain. The patient's daughter also questions whether the patient has been getting her medications properly as a result of her encephalopathy over the last several days.   In ED, the patient was noted to have potassium 3.1, temperature 99.69F, WBC 8.0, lactic acid 1.29. Troponin was negative. EKG shows sinus rhythm with LAFB. The patient was given 1 L of normal saline after which the patient was a little more alert. She was given 1 dose of ceftriaxone.   Assessment/Plan:  Acute encephalopathy  - Likely due to dehydration and UTI  - still relatively lethargic - continue Rocephin day #2 and follow upon urine culture  Dehydration  - Clinically, patient appears volume depleted  - Continue IV fluids  - hold maxzide today as well  Pyuria  - Continue empiric ceftriaxone pending culture data, day #2  Hypokalemia  -  supplemented and within normal limits this AM - BMP in AM Hypothyroidism  - TSH slighlty above target range - continue home medical regimen  Diabetes insipidus  - Continue DDAVP  - pt used to take lithium  Hypertension  - Hold amlodipine as the patient's blood pressure is soft  Bipolar disorder  - Continue Invega, trazadone, paxil  Stage 2 sacral decubitus ulcer  - present at time of admission  - wound care nurse consult  - not infected on exam  Acute renal failure - secondary to pre renal etiology - continue IVF and repeat BMP in AM  Consultants:  None Procedures/Studies:  Dg Chest Port 1 View   01-14-2013   There is no evidence of pneumonia.   Antibiotics:  Rocephin 01/15/2023 -->  Code Status: Full Family Communication: Pt at bedside Disposition Plan: Home when medically stable  HPI/Subjective: No events overnight.   Objective: Filed Vitals:   01-14-2013 1605 2013-01-14 2156 01/05/13 0508 01/05/13 1513  BP: 114/64 129/73 131/76 109/84  Pulse:  69 67 62  Temp: 98.5 F (36.9 C) 98.3 F (36.8 C) 98.2 F (36.8 C) 98.2 F (36.8 C)  TempSrc:  Oral Oral Oral  Resp: 20 20 20 20   Height: 5' (1.524 m)     Weight: 63.5 kg (139 lb 15.9 oz)     SpO2:  99% 99% 99%    Intake/Output Summary (Last 24 hours) at 01/05/13 1639 Last data filed at 01/05/13 1513  Gross per 24 hour  Intake    720 ml  Output      4 ml  Net  716 ml    Exam:   General:  Pt is somnolent, does not follows commands appropriately, not in acute distress  Cardiovascular: Regular rate and rhythm, S1/S2, no murmurs, no rubs, no gallops  Respiratory: Clear to auscultation bilaterally, no wheezing, diminished breath sounds at bases   Abdomen: Soft, non tender, non distended, bowel sounds present, no guarding  Extremities: No edema, pulses DP and PT palpable bilaterally  Data Reviewed: Basic Metabolic Panel:  Recent Labs Lab 01/04/13 1236 01/05/13 0521  NA 144 143  K 3.1* 3.5  CL 106 108   CO2 26 27  GLUCOSE 119* 90  BUN 25* 19  CREATININE 1.28* 1.24*  CALCIUM 9.0 8.6  MG  --  2.3   Liver Function Tests:  Recent Labs Lab 01/04/13 1236  AST 15  ALT 7  ALKPHOS 89  BILITOT 0.5  PROT 7.7  ALBUMIN 3.2*   CBC:  Recent Labs Lab 01/04/13 1236 01/05/13 0521  WBC 8.0 5.8  NEUTROABS 6.6  --   HGB 13.8 12.3  HCT 42.8 38.4  MCV 90.9 92.8  PLT 335 329   Cardiac Enzymes:  Recent Labs Lab 01/04/13 1236  TROPONINI <0.30   CBG:  Recent Labs Lab 01/04/13 1642  GLUCAP 92    Recent Results (from the past 240 hour(s))  URINE CULTURE     Status: None   Collection Time    01/04/13 12:34 PM      Result Value Range Status   Specimen Description URINE, CLEAN CATCH   Final   Special Requests NONE   Final   Culture  Setup Time     Final   Value: 01/04/2013 20:27     Performed at [facility] Count     Final   Value: >=100,000 COLONIES/ML     Performed at [facility]   Culture     Final   Value: ESCHERICHIA COLI     Performed at [facility]   Report Status PENDING   Incomplete  MRSA PCR SCREENING     Status: None   Collection Time    01/04/13  4:42 PM      Result Value Range Status   MRSA by PCR NEGATIVE  NEGATIVE Final   Comment:            The GeneXpert MRSA Assay (FDA     approved for NASAL specimens     only), is one component of a     comprehensive MRSA colonization     surveillance program. It is not     intended to diagnose MRSA     infection nor to guide or     monitor treatment for     MRSA infections.     Scheduled Meds: . atorvastatin  10 mg Oral QHS  . cefTRIAXone  IV  1 g Intravenous Q24H  . desmopressin  0.2 mg Oral BID  . heparin  5,000 Units Subcutaneous Q8H  . latanoprost  1 drop Both Eyes QHS  . levothyroxine  112 mcg Oral QAC breakfast  . Paliperidone  1.5 mg Oral QHS  . pantoprazole  40 mg Oral Daily  . PARoxetine  20 mg Oral Daily  . senna-docusate  2 tablet Oral QHS  . traZODone  25  mg Oral QHS   Continuous Infusions: . 0.9 % NaCl with KCl 20 mEq / L 1,000 mL (01/04/13 1800)   Erica Presto, MD  [facility] Pager [phone]  If 7PM-7AM, please  contact night-coverage [email or web address] Password [facility] 01/05/2013, 4:39 PM   LOS: 1 day

## 2013-01-05 NOTE — Consult Note (Signed)
WOC wound consult note Reason for Consult:Stage II Pressure ulcer on coccyx noted on admission.  Consulted for assessment and suggestions for care. Wound type:Pressure compounded by moisture Pressure Ulcer POA: Yes Measurement:2.5cm x 1cm x 0.2cm Wound ZOX:WRUEA, pink, moist Drainage (amount, consistency, odor) Scant serous Periwound:macerated Dressing procedure/placement/frequency:I agree with the treatment initiated by the nursing staff and have ordered soft silicone foam dressings to this area to be changed routinely twice weekly and PRN.  I will also order a therapeutic mattress with low air loss feature to assist with moisture management. This area should resurface within 30 days if all other risk factors are managed. WOC nursing team will not follow, but will remain available to this patient, the nursing and medical teams.  Please re-consult if needed. Thanks, Ladona Mow, MSN, RN, GNP, [place], CWON-AP [phone])

## 2013-01-05 NOTE — Care Management Note (Signed)
    Page 1 of 1   01/05/2013     2:55:00 PM   CARE MANAGEMENT NOTE 01/05/2013  Patient:  [name], [name]   Account Number:  [id number]  Date Initiated:  01/05/2013  Documentation initiated by:  Lanier Clam  Subjective/Objective Assessment:   78 Y/O F ADMITTED W/AMS.DEHYRDATION,UTI.ZO:XWRUEAV,WUJWJX PRESSURE ULCER.     Action/Plan:   FROM SNF-[facility].   Anticipated DC Date:  01/07/2013   Anticipated DC Plan:  SKILLED NURSING FACILITY      DC Planning Services  CM consult      Choice offered to / List presented to:             Status of service:  In process, will continue to follow Medicare Important Message given?   (If response is "NO", the following Medicare IM given date fields will be blank) Date Medicare IM given:   Date Additional Medicare IM given:    Discharge Disposition:    Per UR Regulation:  Reviewed for med. necessity/level of care/duration of stay  If discussed at Long Length of Stay Meetings, dates discussed:    Comments:  01/05/13 Florina Glas RN,BSN NCM [phone] WOC FOLLOWING.

## 2013-01-05 NOTE — Progress Notes (Signed)
INITIAL NUTRITION ASSESSMENT  DOCUMENTATION CODES Per approved criteria  -Not Applicable   INTERVENTION: Provide Ensure Complete BID Provide Resource breeze once daily Continue Multivitamin with minerals daily  NUTRITION DIAGNOSIS: Predicted suboptimal energy intake related to AMS and weight loss as evidenced by pt's chart and 12% weight loss in less than 4 months.   Goal: Pt to meet >/= 90% of their estimated nutrition needs   Monitor:  PO intake Weight Labs  Reason for Assessment: Low Braden  78 y.o. female  Admitting Dx: Acute encephalopathy  ASSESSMENT: 78 year old female diabetes insipidus, thyroid cancer, bipolar disorder, hypertension, and dementia presents with one-week history of worsening mental status. Per nursing notes, pt is eating 25% of meals today and ate 60% of dinner last night. Pt asleep at time of visit, no family at bedside. No nutritional supplements listed in paper chart or H&P. Pt seen by RD August 2013 and pt was receiving TwoCal nutritional supplement TID at that time. Pt at nutritional risk due to age, stage 2 pressure ulcer, and acute encephalopathy.   Height: Ht Readings from Last 1 Encounters:  01/04/13 5' (1.524 m)    Weight: Wt Readings from Last 1 Encounters:  01/04/13 139 lb 15.9 oz (63.5 kg)    Ideal Body Weight: 100 lbs  % Ideal Body Weight: 139%  Wt Readings from Last 10 Encounters:  01/04/13 139 lb 15.9 oz (63.5 kg)  09/19/12 158 lb (71.668 kg)  06/03/12 153 lb (69.4 kg)  04/11/12 154 lb (69.854 kg)  09/06/11 150 lb 9.2 oz (68.3 kg)  02/21/11 133 lb 1.6 oz (60.374 kg)  02/21/11 133 lb 1.6 oz (60.374 kg)  02/01/11 127 lb (57.607 kg)    Usual Body Weight: 153-158 lbs  % Usual Body Weight: 91%  BMI:  Body mass index is 27.34 kg/(m^2).  Estimated Nutritional Needs: Kcal: 1450-1650 Protein: 70-80 grams Fluid: 1.7 L/day  Skin: stage 2 pressure ulcer on sacrum  Diet Order: Cardiac  EDUCATION NEEDS: -No education  needs identified at this time   Intake/Output Summary (Last 24 hours) at 01/05/13 1305 Last data filed at 01/05/13 1254  Gross per 24 hour  Intake    720 ml  Output      2 ml  Net    718 ml    Last BM: 12/28   Labs:   Recent Labs Lab 01/04/13 1236 01/05/13 0521  NA 144 143  K 3.1* 3.5  CL 106 108  CO2 26 27  BUN 25* 19  CREATININE 1.28* 1.24*  CALCIUM 9.0 8.6  MG  --  2.3  GLUCOSE 119* 90    CBG (last 3)   Recent Labs  01/04/13 1642  GLUCAP 92    Scheduled Meds: . atorvastatin  10 mg Oral QHS  . cefTRIAXone (ROCEPHIN)  IV  1 g Intravenous Q24H  . desmopressin  0.2 mg Oral BID  . heparin  5,000 Units Subcutaneous Q8H  . latanoprost  1 drop Both Eyes QHS  . levothyroxine  112 mcg Oral QAC breakfast  . multivitamin with minerals  1 tablet Oral Daily  . Paliperidone  1.5 mg Oral QHS  . pantoprazole  40 mg Oral Daily  . PARoxetine  20 mg Oral Daily  . senna-docusate  2 tablet Oral QHS  . sodium chloride  3 mL Intravenous Q12H  . traZODone  25 mg Oral QHS    Continuous Infusions: . 0.9 % NaCl with KCl 20 mEq / L 1,000 mL (01/04/13 1800)  Past Medical History  Diagnosis Date  . Thyroid cancer   . Ulcer     chronic  . Depression   . Vitamin D deficiency   . Anxiety   . Malaise and fatigue   . Osteoarthritis   . Kidney disease   . Pneumonia   . Dementia   . Alzheimer disease   . Hypothyroidism   . Bipolar 1 disorder   . Dehydration   . Hypertension   . Diabetes mellitus     Past Surgical History  Procedure Laterality Date  . Esophagogastroduodenoscopy  02/23/2011    Procedure: ESOPHAGOGASTRODUODENOSCOPY (EGD);  Surgeon: Theda Belfast, MD;  Location: [facility] ENDOSCOPY;  Service: Endoscopy;  Laterality: N/A;  . Abdominal hysterectomy    . Foot surgery    . Retinal detachment surgery    . Thyroidectomy      Ian Malkin RD, LDN Inpatient Clinical Dietitian Pager: [phone] After Hours Pager: [phone]

## 2013-01-06 LAB — CBC
HCT: 39.8 % (ref 36.0–46.0)
MCH: 29.5 pg (ref 26.0–34.0)
MCHC: 31.7 g/dL (ref 30.0–36.0)
MCV: 93.2 fL (ref 78.0–100.0)
Platelets: 330 10*3/uL (ref 150–400)
RBC: 4.27 MIL/uL (ref 3.87–5.11)
RDW: 14.6 % (ref 11.5–15.5)
WBC: 5.7 10*3/uL (ref 4.0–10.5)

## 2013-01-06 LAB — URINE CULTURE

## 2013-01-06 LAB — BASIC METABOLIC PANEL
BUN: 11 mg/dL (ref 6–23)
CO2: 24 mEq/L (ref 19–32)
Calcium: 9 mg/dL (ref 8.4–10.5)
Chloride: 116 mEq/L — ABNORMAL HIGH (ref 96–112)
Creatinine, Ser: 0.97 mg/dL (ref 0.50–1.10)
GFR calc non Af Amer: 55 mL/min — ABNORMAL LOW (ref >90)
Sodium: 151 mEq/L — ABNORMAL HIGH (ref 137–147)

## 2013-01-06 MED ORDER — POTASSIUM CHLORIDE IN NACL 20-0.9 MEQ/L-% IV SOLN
1000.0000 mL | INTRAVENOUS | Status: DC
  Administered 2013-01-06: 22:00:00 1000 mL via INTRAVENOUS
  Filled 2013-01-06: qty 1000

## 2013-01-06 NOTE — Progress Notes (Signed)
Patient ID: [name], female   DOB: Sep 01, 1934, 78 y.o.   MRN: [id number]  [facility] PROGRESS NOTE  [name] WUJ:[id number] DOB: 04-21-1934 DOA: 01/04/2013 PCP: Bufford Spikes, DO  Brief narrative:  78 year old female diabetes insipidus, thyroid cancer, bipolar disorder, hypertension, and dementia presents with one-week history of worsening mental status. The patient is unable to provide any history due to her encephalopathy. All of this history was obtained by speaking with the patient's daughter at the bedside. Over the past week, the patient has had declining mental status to the point where there was difficulty waking the patient up. As a result EMS was activated. According to the daughter, the patient has been receiving Tamiflu on what appears to be a prophylactic basis at her nursing facility, [facility]. The patient on day #11 of 14 on admission date. According to the daughter, the patient has not been tested for influenza, nor has she been told that she has had influenza. There has been no history of fever, coughing, shortness of breath, vomiting, diarrhea, abdominal pain. The patient's daughter also questions whether the patient has been getting her medications properly as a result of her encephalopathy over the last several days.   In ED, the patient was noted to have potassium 3.1, temperature 99.2F, WBC 8.0, lactic acid 1.29. Troponin was negative. EKG shows sinus rhythm with LAFB. The patient was given 1 L of normal saline after which the patient was a little more alert. She was given 1 dose of ceftriaxone.   Assessment/Plan:  Acute encephalopathy  - Likely due to dehydration and UTI  - pt more alert this AM and follows some commands  - continue Rocephin day #23 and follow upon urine culture  Dehydration  - Clinically, patient appears volume depleted  - Continue IV fluids  - hold maxzide today as well  Pyuria  - Continue empiric ceftriaxone pending culture data,  day #3 Hypokalemia  - supplemented and within normal limits this AM  - BMP in AM  Hypothyroidism  - TSH slighlty above target range  - continue home medical regimen  Diabetes insipidus  - Continue DDAVP  - pt used to take lithium  Hypertension  - Hold amlodipine for now and may be able to resume in AM Bipolar disorder  - Continue Invega, trazadone, paxil  Stage 2 sacral decubitus ulcer  - present at time of admission  - wound care nurse assistance appreciated  - not infected on exam  Acute renal failure  - secondary to pre renal etiology  - continue IVF as Cr is trending down and is WLN this AM Hypernatremia - will encourage PO intake - repeat BMP in AM  Consultants:  None Procedures/Studies:  Dg Chest Port 1 View 01/04/2013 There is no evidence of pneumonia.  Antibiotics:  Rocephin 12/28 -->  Code Status: Full  Family Communication: Pt at bedside  Disposition Plan: Home when medically stable  HPI/Subjective: No events overnight.   Objective: Filed Vitals:   01/05/13 1513 01/05/13 2112 01/06/13 0437 01/06/13 1430  BP: 109/84 110/85 129/75 124/66  Pulse: 62 67 79 101  Temp: 98.2 F (36.8 C) 98.3 F (36.8 C) 97.2 F (36.2 C) 97.7 F (36.5 C)  TempSrc: Oral Oral Oral Oral  Resp: 20 20 20 18   Height:      Weight:      SpO2: 99% 99% 98% 97%    Intake/Output Summary (Last 24 hours) at 01/06/13 1940 Last data filed at 01/06/13 1858  Gross per  24 hour  Intake 2973.75 ml  Output      0 ml  Net 2973.75 ml    Exam:   General:  Pt is alert, follows commands appropriately, not in acute distress  Cardiovascular: Regular rhythm, tachycardic, no rubs, no gallops  Respiratory: Clear to auscultation bilaterally, no wheezing, diminished breath sounds at bases   Abdomen: Soft, non tender, non distended, bowel sounds present, no guarding  Extremities: No edema, pulses DP and PT palpable bilaterally  Neuro: Grossly nonfocal  Data Reviewed: Basic Metabolic  Panel:  Recent Labs Lab 01/04/13 1236 01/05/13 0521 01/06/13 0523  NA 144 143 151*  K 3.1* 3.5 4.0  CL 106 108 116*  CO2 26 27 24   GLUCOSE 119* 90 118*  BUN 25* 19 11  CREATININE 1.28* 1.24* 0.97  CALCIUM 9.0 8.6 9.0  MG  --  2.3  --    Liver Function Tests:  Recent Labs Lab 01/04/13 1236  AST 15  ALT 7  ALKPHOS 89  BILITOT 0.5  PROT 7.7  ALBUMIN 3.2*   CBC:  Recent Labs Lab 01/04/13 1236 01/05/13 0521 01/06/13 0523  WBC 8.0 5.8 5.7  NEUTROABS 6.6  --   --   HGB 13.8 12.3 12.6  HCT 42.8 38.4 39.8  MCV 90.9 92.8 93.2  PLT 335 329 330   Cardiac Enzymes:  Recent Labs Lab 01/04/13 1236  TROPONINI <0.30   CBG:  Recent Labs Lab 01/04/13 1642  GLUCAP 92    Recent Results (from the past 240 hour(s))  URINE CULTURE     Status: None   Collection Time    01/04/13 12:34 PM      Result Value Range Status   Specimen Description URINE, CLEAN CATCH   Final   Special Requests NONE   Final   Culture  Setup Time     Final   Value: 01/04/2013 20:27     Performed at [facility] Count     Final   Value: >=100,000 COLONIES/ML     Performed at [facility]   Culture     Final   Value: ESCHERICHIA COLI     Performed at [facility]   Report Status 01/06/2013 FINAL   Final   Organism ID, Bacteria ESCHERICHIA COLI   Final  MRSA PCR SCREENING     Status: None   Collection Time    01/04/13  4:42 PM      Result Value Range Status   MRSA by PCR NEGATIVE  NEGATIVE Final   Comment:            The GeneXpert MRSA Assay (FDA     approved for NASAL specimens     only), is one component of a     comprehensive MRSA colonization     surveillance program. It is not     intended to diagnose MRSA     infection nor to guide or     monitor treatment for     MRSA infections.     Scheduled Meds: . atorvastatin  10 mg Oral QHS  . cefTRIAXone  IV  1 g Intravenous Q24H  . desmopressin  0.2 mg Oral BID  . heparin  5,000 Units  Subcutaneous Q8H  . latanoprost  1 drop Both Eyes QHS  . levothyroxine  112 mcg Oral QAC breakfast  . Paliperidone  1.5 mg Oral QHS  . pantoprazole  40 mg Oral Daily  . PARoxetine  20 mg  Oral Daily  . senna-docusate  2 tablet Oral QHS  . traZODone  25 mg Oral QHS   Continuous Infusions: . 0.9 % NaCl with KCl 20 mEq / L 1,000 mL (01/05/13 2301)   Debbora Presto, MD  [facility] Pager [phone]  If 7PM-7AM, please contact night-coverage [email or web address] Password [email or web address] 01/06/2013, 7:40 PM   LOS: 2 days

## 2013-01-06 NOTE — Progress Notes (Signed)
CSW met with patient's daughter at bedside. Daughter would like CSW to see if patient can get into [facility] upon discharge. CSW to fax patient's information there.  Ranyah Groeneveld C. Lisl Slingerland MSW, LCSW [phone]

## 2013-01-07 DIAGNOSIS — E870 Hyperosmolality and hypernatremia: Secondary | ICD-10-CM

## 2013-01-07 LAB — CBC
HCT: 34.9 % — ABNORMAL LOW (ref 36.0–46.0)
MCH: 29.2 pg (ref 26.0–34.0)
MCHC: 30.7 g/dL (ref 30.0–36.0)
MCV: 95.1 fL (ref 78.0–100.0)
Platelets: 325 10*3/uL (ref 150–400)
RBC: 3.67 MIL/uL — ABNORMAL LOW (ref 3.87–5.11)
RDW: 15.1 % (ref 11.5–15.5)

## 2013-01-07 LAB — BASIC METABOLIC PANEL
BUN: 10 mg/dL (ref 6–23)
BUN: 12 mg/dL (ref 6–23)
CO2: 22 mEq/L (ref 19–32)
Calcium: 8.7 mg/dL (ref 8.4–10.5)
Calcium: 8.8 mg/dL (ref 8.4–10.5)
Chloride: 117 mEq/L — ABNORMAL HIGH (ref 96–112)
Creatinine, Ser: 1.03 mg/dL (ref 0.50–1.10)
Creatinine, Ser: 0.97 mg/dL (ref 0.50–1.10)
GFR calc Af Amer: 60 mL/min — ABNORMAL LOW (ref >90)
GFR calc Af Amer: 64 mL/min — ABNORMAL LOW (ref >90)
GFR calc non Af Amer: 51 mL/min — ABNORMAL LOW (ref >90)
GFR calc non Af Amer: 55 mL/min — ABNORMAL LOW (ref >90)
Potassium: 3.7 mEq/L (ref 3.7–5.3)

## 2013-01-07 MED ORDER — DESMOPRESSIN ACETATE 0.2 MG PO TABS
0.4000 mg | ORAL_TABLET | Freq: Two times a day (BID) | ORAL | Status: DC
  Administered 2013-01-07 – 2013-01-09 (×4): 0.4 mg via ORAL
  Filled 2013-01-07 (×5): qty 2

## 2013-01-07 MED ORDER — DEXTROSE 5 % IV SOLN
INTRAVENOUS | Status: DC
  Administered 2013-01-07 – 2013-01-08 (×2): via INTRAVENOUS

## 2013-01-07 MED ORDER — DESMOPRESSIN ACETATE 0.2 MG PO TABS
0.2000 mg | ORAL_TABLET | ORAL | Status: DC
  Administered 2013-01-07 – 2013-01-08 (×2): 0.2 mg via ORAL
  Filled 2013-01-07 (×3): qty 1

## 2013-01-07 NOTE — Progress Notes (Signed)
Report received from H. Holderness, RN.  Assessment unchanged. Josephine Rudnick B  

## 2013-01-07 NOTE — Progress Notes (Signed)
Patient had not voided since this am.  She has been very lethargic today more so than yesterday.  Arousable but lethargic. I bladder scanned her which yielded between 450-550 cc. MD was paged. Orders to insert FC.  14 French catheter inserted without difficulty.  Yellow cloudy urine returned.

## 2013-01-07 NOTE — Evaluation (Signed)
Physical Therapy Evaluation Patient Details Name: [name] MRN: [id number] DOB: [birth year] Today's Date: 01/07/2013 Time: 7829-5621 PT Time Calculation (min): 13 min  PT Assessment / Plan / Recommendation History of Present Illness  78 yo female admiited with acute encephalopathy. Hx of pressure ulcers-coccyx, dementia, osteoporosis, bipolar d/o. Pt is longterm resident at SNF.   Clinical Impression  On eval, pt required +2 assist for bed mobility. Unable to attempt standing due to poor sitting balance, impaired cognition, and lethargy. Recommend SNF at discharge.     PT Assessment  Patient needs continued PT services (trial of PT)    Follow Up Recommendations  SNF    Does the patient have the potential to tolerate intense rehabilitation      Barriers to Discharge        Equipment Recommendations  None recommended by PT    Recommendations for Other Services     Frequency Min 2X/week    Precautions / Restrictions Precautions Precautions: Fall Restrictions Weight Bearing Restrictions: No   Pertinent Vitals/Pain Pt denied pain when asked-unsure if this is reliable.       Mobility  Bed Mobility Bed Mobility: Rolling Right;Rolling Left;Supine to Sit;Sit to Supine Rolling Right: 1: +1 Total assist Rolling Left: 1: +1 Total assist Supine to Sit: 1: +2 Total assist Supine to Sit: Patient Percentage: 0% Sit to Supine: 1: +2 Total assist Sit to Supine: Patient Percentage: 0% Details for Bed Mobility Assistance: Pt able to reach over for handrail with hand-over-hand guidance. Utilized bedpad for positioning.     Exercises     PT Diagnosis: Difficulty walking;Generalized weakness;Altered mental status  PT Problem List: Decreased strength;Decreased range of motion;Decreased activity tolerance;Decreased balance;Decreased mobility;Decreased cognition;Decreased knowledge of use of DME;Decreased safety awareness PT Treatment Interventions: Functional mobility  training;Therapeutic activities;Therapeutic exercise;Balance training;Patient/family education     PT Goals(Current goals can be found in the care plan section) Acute Rehab PT Goals Patient Stated Goal: none stated PT Goal Formulation: Patient unable to participate in goal setting Time For Goal Achievement: 01/21/13 Potential to Achieve Goals: Poor  Visit Information  Last PT Received On: 01/07/13 Assistance Needed: +2 History of Present Illness: 78 yo female admiited with acute encephalopathy. Hx of pressure ulcers-coccyx, dementia, osteoporosis, bipolar d/o. Pt is longterm resident at SNF.        Prior Functioning  Home Living Family/patient expects to be discharged to:: Skilled nursing facility Prior Function Level of Independence: Needs assistance Communication Communication: Expressive difficulties    Cognition  Cognition Arousal/Alertness: Lethargic Behavior During Therapy: Flat affect Overall Cognitive Status: Difficult to assess Difficult to assess due to: Impaired communication    Extremity/Trunk Assessment Upper Extremity Assessment Upper Extremity Assessment: Generalized weakness Lower Extremity Assessment Lower Extremity Assessment: Generalized weakness Cervical / Trunk Assessment Cervical / Trunk Assessment: Kyphotic   Balance Balance Balance Assessed: Yes Static Sitting Balance Static Sitting - Balance Support: No upper extremity supported;Feet supported;Feet unsupported Static Sitting - Level of Assistance: 2: Max assist Static Sitting - Comment/# of Minutes: Pt unable to sit unsupported. Multimodal cues  for hand placement. LOB repeatedly in posterior direction. Pt kept head flexed and eyes closed mostly.   End of Session PT - End of Session Patient left: in bed;with call bell/phone within reach;with bed alarm set  GP     Rebeca Alert, MPT Pager: [phone]

## 2013-01-07 NOTE — Progress Notes (Signed)
[facility] PROGRESS NOTE  [name] ZOX:[id number] DOB: December 26, 1934 DOA: 01/04/2013 PCP: Bufford Spikes, DO  Brief narrative:  78 year old female diabetes insipidus, thyroid cancer, bipolar disorder, hypertension, and dementia presents with one-week history of worsening mental status. The patient is unable to provide any history due to her encephalopathy. All of this history was obtained by speaking with the patient's daughter at the bedside. Over the past week, the patient has had declining mental status to the point where there was difficulty waking the patient up. As a result EMS was activated. According to the daughter, the patient has been receiving Tamiflu on what appears to be a prophylactic basis at her nursing facility, [facility]. The patient on day #11 of 14 on admission date. According to the daughter, the patient has not been tested for influenza, nor has she been told that she has had influenza. There has been no history of fever, coughing, shortness of breath, vomiting, diarrhea, abdominal pain. The patient's daughter also questions whether the patient has been getting her medications properly as a result of her encephalopathy over the last several days.   In ED, the patient was noted to have potassium 3.1, temperature 99.31F, WBC 8.0, lactic acid 1.29. Troponin was negative. EKG shows sinus rhythm with LAFB. The patient was given 1 L of normal saline after which the patient was a little more alert. She was given 1 dose of ceftriaxone.   Assessment/Plan  Acute encephalopathy Likely due to dehydration and UTI although tamiflu has also been known to cause confusion - continue Rocephin  Dehydration patient volume depleted  - Continue IV fluids  - hold maxzide again today   Hypernatremia likely multifactorial from dehydration, confusion, underlying diabetes insipidus - continue to encourage PO intake now that more alert - start D5W -  Continue DDAVP, but will verify dose  (records indicate she may have been getting 0.4mg  BID)  E. Coli UTI -  Continue ceftriaxone  Hypokalemia resolved with supplementation  Hypothyroidism  - TSH slighlty above target range  - continue home medical regimen and repeat in 4 weeks  Diabetes insipidus  - Continue DDAVP and verify dose - pt used to take lithium   Hypertension BP stable - continue to hold norvasc  Bipolar disorder  - Continue Invega, trazadone, paxil   Stage 2 sacral decubitus ulcer  - present at time of admission  - wound care nurse assistance appreciated   Acute renal failure due to dehydration and resolved with IVF, creatinine trended down from 1.24 to 1.03  Normocytic anemia, likely secondary to marrow suppression from acute  Illness and chornic disease. Hemodilution from IVF. -  Defer to PCP  Diet:  Healthy heart Access:  PIV IVF:  yes Proph:  heparin  Code Status: full Family Communication: patient and family Disposition Plan: pending improvement in sodium   Consultants:  None  Procedures:  CXR  Antibiotics:  Ceftriaxone 12/28  HPI/Subjective:  Responds minimally to questions, but awake and alert  Objective: Filed Vitals:   01/06/13 0437 01/06/13 1430 01/06/13 2230 01/07/13 0516  BP: 129/75 124/66 135/73 132/75  Pulse: 79 101 98 98  Temp: 97.2 F (36.2 C) 97.7 F (36.5 C) 98.8 F (37.1 C) 98.5 F (36.9 C)  TempSrc: Oral Oral Oral Oral  Resp: 20 18 16 18   Height:      Weight:      SpO2: 98% 97% 100% 100%    Intake/Output Summary (Last 24 hours) at 01/07/13 1217 Last data filed at 01/07/13  0900  Gross per 24 hour  Intake   2130 ml  Output      0 ml  Net   2130 ml   Filed Weights   01/04/13 1605  Weight: 63.5 kg (139 lb 15.9 oz)    Exam:   General:  CF, No acute distress  HEENT:  NCAT, MMM  Cardiovascular:  RRR, nl S1, S2 no mrg, 2+ pulses, warm extremities  Respiratory:  CTAB, no increased WOB  Abdomen:   NABS, soft, NT/ND  MSK:   Normal tone  and bulk, no LEE  Neuro:  Grossly intact  Data Reviewed: Basic Metabolic Panel:  Recent Labs Lab 01/04/13 1236 01/05/13 0521 01/06/13 0523 01/07/13 0537  NA 144 143 151* 152*  K 3.1* 3.5 4.0 3.7  CL 106 108 116* 117*  CO2 26 27 24 23   GLUCOSE 119* 90 118* 103*  BUN 25* 19 11 10   CREATININE 1.28* 1.24* 0.97 1.03  CALCIUM 9.0 8.6 9.0 8.7  MG  --  2.3  --   --    Liver Function Tests:  Recent Labs Lab 01/04/13 1236  AST 15  ALT 7  ALKPHOS 89  BILITOT 0.5  PROT 7.7  ALBUMIN 3.2*   No results found for this basename: LIPASE, AMYLASE,  in the last 168 hours No results found for this basename: AMMONIA,  in the last 168 hours CBC:  Recent Labs Lab 01/04/13 1236 01/05/13 0521 01/06/13 0523 01/07/13 0537  WBC 8.0 5.8 5.7 6.0  NEUTROABS 6.6  --   --   --   HGB 13.8 12.3 12.6 10.7*  HCT 42.8 38.4 39.8 34.9*  MCV 90.9 92.8 93.2 95.1  PLT 335 329 330 325   Cardiac Enzymes:  Recent Labs Lab 01/04/13 1236  TROPONINI <0.30   BNP (last 3 results) No results found for this basename: PROBNP,  in the last 8760 hours CBG:  Recent Labs Lab 01/04/13 1642  GLUCAP 92    Recent Results (from the past 240 hour(s))  URINE CULTURE     Status: None   Collection Time    01/04/13 12:34 PM      Result Value Range Status   Specimen Description URINE, CLEAN CATCH   Final   Special Requests NONE   Final   Culture  Setup Time     Final   Value: 01/04/2013 20:27     Performed at [facility] Count     Final   Value: >=100,000 COLONIES/ML     Performed at [facility]   Culture     Final   Value: ESCHERICHIA COLI     Performed at [facility]   Report Status 01/06/2013 FINAL   Final   Organism ID, Bacteria ESCHERICHIA COLI   Final  MRSA PCR SCREENING     Status: None   Collection Time    01/04/13  4:42 PM      Result Value Range Status   MRSA by PCR NEGATIVE  NEGATIVE Final   Comment:            The GeneXpert MRSA Assay (FDA      approved for NASAL specimens     only), is one component of a     comprehensive MRSA colonization     surveillance program. It is not     intended to diagnose MRSA     infection nor to guide or     monitor treatment for  MRSA infections.     Studies: No results found.  Scheduled Meds: . atorvastatin  10 mg Oral QHS  . cefTRIAXone (ROCEPHIN)  IV  1 g Intravenous Q24H  . desmopressin  0.2 mg Oral BID  . feeding supplement (ENSURE COMPLETE)  237 mL Oral BID BM  . feeding supplement (RESOURCE BREEZE)  1 Container Oral Q24H  . heparin  5,000 Units Subcutaneous Q8H  . latanoprost  1 drop Both Eyes QHS  . levothyroxine  112 mcg Oral QAC breakfast  . multivitamin with minerals  1 tablet Oral Daily  . Paliperidone  1.5 mg Oral QHS  . pantoprazole  40 mg Oral Daily  . PARoxetine  20 mg Oral Daily  . senna-docusate  2 tablet Oral QHS  . sodium chloride  3 mL Intravenous Q12H  . traZODone  25 mg Oral QHS   Continuous Infusions: . dextrose 75 mL/hr at 01/07/13 4098    Active Problems:   Essential hypertension, benign   Dementia   Diabetes insipidus -  lithium-induced   Acute encephalopathy   Dehydration   UTI (lower urinary tract infection)   Hypokalemia   Hypothyroidism    Time spent: 30 min    Angelis Gates, [facility]  [facility] Pager [phone]. If 7PM-7AM, please contact night-coverage at [email or web address], password [facility] 01/07/2013, 12:17 PM  LOS: 3 days

## 2013-01-08 DIAGNOSIS — E870 Hyperosmolality and hypernatremia: Secondary | ICD-10-CM

## 2013-01-08 LAB — BASIC METABOLIC PANEL
BUN: 11 mg/dL (ref 6–23)
CHLORIDE: 109 meq/L (ref 96–112)
CO2: 22 meq/L (ref 19–32)
Calcium: 8.7 mg/dL (ref 8.4–10.5)
Creatinine, Ser: 0.93 mg/dL (ref 0.50–1.10)
GFR calc non Af Amer: 58 mL/min — ABNORMAL LOW (ref >90)
GFR, EST AFRICAN AMERICAN: 67 mL/min — AB (ref >90)
Glucose, Bld: 104 mg/dL — ABNORMAL HIGH (ref 70–99)
POTASSIUM: 4.4 meq/L (ref 3.7–5.3)
Sodium: 144 mEq/L (ref 137–147)

## 2013-01-08 NOTE — Progress Notes (Signed)
[facility] PROGRESS NOTE  [name] ZOX:[id number] DOB: Jan 28, 1934 DOA: 01/04/2013 PCP: Hollace Kinnier, DO  Brief narrative:  79 year old female diabetes insipidus, thyroid cancer, bipolar disorder, hypertension, and dementia presents with one-week history of worsening mental status. The patient is unable to provide any history due to her encephalopathy. All of this history was obtained by speaking with the patient's daughter at the bedside. Over the past week, the patient has had declining mental status to the point where there was difficulty waking the patient up. As a result EMS was activated. According to the daughter, the patient has been receiving Tamiflu on what appears to be a prophylactic basis at her nursing facility, [facility]. The patient on day #11 of 14 on admission date. According to the daughter, the patient has not been tested for influenza, nor has she been told that she has had influenza. There has been no history of fever, coughing, shortness of breath, vomiting, diarrhea, abdominal pain. The patient's daughter also questions whether the patient has been getting her medications properly as a result of her encephalopathy over the last several days.   In ED, the patient was noted to have potassium 3.1, temperature 99.36F, WBC 8.0, lactic acid 1.29. Troponin was negative. EKG shows sinus rhythm with LAFB. The patient was given 1 L of normal saline after which the patient was a little more alert. She was given 1 dose of ceftriaxone.   Assessment/Plan  Acute encephalopathy Likely due to dehydration and UTI although tamiflu has also been known to cause confusion - continue Rocephin  Dehydration patient volume depleted, resolving - Continue IV fluids  - hold maxzide again today   Complains of nausea, may be secondary to UTI or recent hypernatremia and preventing her from drinking enough to stay hydrated -  Continue antiemetics prn  Hypernatremia likely multifactorial  from dehydration, confusion, underlying diabetes insipidus, resolving with change to DDAVP dose and free water, but still in high normal range and not eating well - continue to encourage PO intake now that more alert - continue D5W -  Continue DDAVP -  Repeat BMP in AM  E. Coli UTI -  Ceftriaxone day 5/5  Hypokalemia resolved with supplementation  Hypothyroidism  - TSH slighlty above target range  - continue home medical regimen and repeat in 4 weeks  Diabetes insipidus  - Continue DDAVP - pt used to take lithium   Hypertension BP stable - continue to hold norvasc  Bipolar disorder  - Continue Invega, trazadone, paxil   Stage 2 sacral decubitus ulcer  - present at time of admission  - wound care nurse assistance appreciated   Acute renal failure due to dehydration and resolved with IVF, creatinine trended down from 1.24 to 0.93  Normocytic anemia, likely secondary to marrow suppression from acute  Illness and chornic disease. Hemodilution from IVF. -  Defer to PCP  Diet:  Healthy heart Access:  PIV IVF:  yes Proph:  heparin  Code Status: full Family Communication: patient and family Disposition Plan: pending improvement in sodium and eating/drinking improved.  High risk of imminent dehydration given current nausea and poor PO intake.  If drinking better today, possible discharge tomorrow.    Consultants:  None  Procedures:  CXR  Antibiotics:  Ceftriaxone 12/28 > 1/1  HPI/Subjective:  Responds to questions today.  Denies pain, SOB, but does not have nausea without vomiting today.  Denies constipation or diarrhea.    Objective: Filed Vitals:   01/07/13 1400 01/07/13 2111 01/08/13 0451  01/08/13 1353  BP: 115/57 111/60 144/76 132/74  Pulse: 61 66 59 61  Temp: 98 F (36.7 C) 98 F (36.7 C) 98 F (36.7 C) 98.2 F (36.8 C)  TempSrc: Oral Oral Oral Oral  Resp: 16 18 18 20   Height:      Weight:      SpO2: 99% 99% 98% 97%    Intake/Output Summary  (Last 24 hours) at 01/08/13 1600 Last data filed at 01/08/13 1300  Gross per 24 hour  Intake 2251.25 ml  Output   1300 ml  Net 951.25 ml   Filed Weights   01/04/13 1605  Weight: 63.5 kg (139 lb 15.9 oz)    Exam:   General:  CF, No acute distress  HEENT:  NCAT, MMM, mild perioral pallor  Cardiovascular:  RRR, nl S1, S2 no mrg, 2+ pulses, warm extremities  Respiratory:  CTAB, no increased WOB  Abdomen:   NABS, soft, NT/ND, but states she feels even more sick to her stomach with palpation of abdomen  MSK:   Normal tone and bulk, no LEE  Neuro:  Grossly intact  Data Reviewed: Basic Metabolic Panel:  Recent Labs Lab 01/05/13 0521 01/06/13 0523 01/07/13 0537 01/07/13 1437 01/08/13 0752  NA 143 151* 152* 149* 144  K 3.5 4.0 3.7 3.7 4.4  CL 108 116* 117* 115* 109  CO2 27 24 23 22 22   GLUCOSE 90 118* 103* 134* 104*  BUN 19 11 10 12 11   CREATININE 1.24* 0.97 1.03 0.97 0.93  CALCIUM 8.6 9.0 8.7 8.8 8.7  MG 2.3  --   --   --   --    Liver Function Tests:  Recent Labs Lab 01/04/13 1236  AST 15  ALT 7  ALKPHOS 89  BILITOT 0.5  PROT 7.7  ALBUMIN 3.2*   No results found for this basename: LIPASE, AMYLASE,  in the last 168 hours No results found for this basename: AMMONIA,  in the last 168 hours CBC:  Recent Labs Lab 01/04/13 1236 01/05/13 0521 01/06/13 0523 01/07/13 0537  WBC 8.0 5.8 5.7 6.0  NEUTROABS 6.6  --   --   --   HGB 13.8 12.3 12.6 10.7*  HCT 42.8 38.4 39.8 34.9*  MCV 90.9 92.8 93.2 95.1  PLT 335 329 330 325   Cardiac Enzymes:  Recent Labs Lab 01/04/13 1236  TROPONINI <0.30   BNP (last 3 results) No results found for this basename: PROBNP,  in the last 8760 hours CBG:  Recent Labs Lab 01/04/13 1642  GLUCAP 92    Recent Results (from the past 240 hour(s))  URINE CULTURE     Status: None   Collection Time    01/04/13 12:34 PM      Result Value Range Status   Specimen Description URINE, CLEAN CATCH   Final   Special Requests  NONE   Final   Culture  Setup Time     Final   Value: 01/04/2013 20:27     Performed at [place]     Final   Value: >=100,000 COLONIES/ML     Performed at [facility]   Culture     Final   Value: ESCHERICHIA COLI     Performed at [facility]   Report Status 01/06/2013 FINAL   Final   Organism ID, Bacteria ESCHERICHIA COLI   Final  MRSA PCR SCREENING     Status: None   Collection Time  01/04/13  4:42 PM      Result Value Range Status   MRSA by PCR NEGATIVE  NEGATIVE Final   Comment:            The GeneXpert MRSA Assay (FDA     approved for NASAL specimens     only), is one component of a     comprehensive MRSA colonization     surveillance program. It is not     intended to diagnose MRSA     infection nor to guide or     monitor treatment for     MRSA infections.     Studies: No results found.  Scheduled Meds: . atorvastatin  10 mg Oral QHS  . cefTRIAXone (ROCEPHIN)  IV  1 g Intravenous Q24H  . desmopressin  0.2 mg Oral Q24H  . desmopressin  0.4 mg Oral BID  . feeding supplement (ENSURE COMPLETE)  237 mL Oral BID BM  . feeding supplement (RESOURCE BREEZE)  1 Container Oral Q24H  . heparin  5,000 Units Subcutaneous Q8H  . latanoprost  1 drop Both Eyes QHS  . levothyroxine  112 mcg Oral QAC breakfast  . multivitamin with minerals  1 tablet Oral Daily  . Paliperidone  1.5 mg Oral QHS  . pantoprazole  40 mg Oral Daily  . PARoxetine  20 mg Oral Daily  . senna-docusate  2 tablet Oral QHS  . sodium chloride  3 mL Intravenous Q12H  . traZODone  25 mg Oral QHS   Continuous Infusions: . dextrose 75 mL/hr at 01/07/13 [id number]    Principal Problem:   Hypernatremia Active Problems:   Essential hypertension, benign   Dementia   Diabetes insipidus -  lithium-induced   Acute encephalopathy   Dehydration   UTI (lower urinary tract infection)   Hypokalemia   Hypothyroidism    Time spent: 30 min    Shenandoah Vandergriff, [place]  [facility] Pager [phone]. If 7PM-7AM, please contact night-coverage at [email or web address], password [facility] 01/08/2013, 4:00 PM  LOS: 4 days

## 2013-01-08 NOTE — Progress Notes (Signed)
On call csw received call from rn that patient may be medically stable for discharge today. Per RN, the fl2 was not on the chart. Per chart review, patient inofrmation was sent to [facility] per family requested, however patient did not received bed offer from this facility. Pt was admitted from [facility] and may return there once medically stable. CSW spoke with RN again, and patient is not medically stable for dsicharge today due to sodium levels. CSW will inform unit csw, who will assist with pt disposition needs tomorrow.   Dorathy Kinsman, LCSW [phone]  ED CSW .01/08/2013 1148am

## 2013-01-08 NOTE — Progress Notes (Signed)
Foley Catheter dcd at 11am per MD Orders. Pt now has been incont x1 of moderate amount urine. Bladder scanned noted with 120cc residual. Will moniter

## 2013-01-09 DIAGNOSIS — E440 Moderate protein-calorie malnutrition: Secondary | ICD-10-CM

## 2013-01-09 DIAGNOSIS — L89152 Pressure ulcer of sacral region, stage 2: Secondary | ICD-10-CM

## 2013-01-09 DIAGNOSIS — E232 Diabetes insipidus: Secondary | ICD-10-CM

## 2013-01-09 DIAGNOSIS — D649 Anemia, unspecified: Secondary | ICD-10-CM

## 2013-01-09 LAB — BASIC METABOLIC PANEL
BUN: 8 mg/dL (ref 6–23)
CALCIUM: 8.7 mg/dL (ref 8.4–10.5)
CHLORIDE: 106 meq/L (ref 96–112)
CO2: 23 meq/L (ref 19–32)
CREATININE: 0.89 mg/dL (ref 0.50–1.10)
GFR calc Af Amer: 71 mL/min — ABNORMAL LOW (ref >90)
GFR calc non Af Amer: 61 mL/min — ABNORMAL LOW (ref >90)
GLUCOSE: 111 mg/dL — AB (ref 70–99)
Potassium: 2.9 mEq/L — CL (ref 3.7–5.3)
SODIUM: 141 meq/L (ref 137–147)

## 2013-01-09 LAB — MAGNESIUM: Magnesium: 1.7 mg/dL (ref 1.5–2.5)

## 2013-01-09 MED ORDER — POTASSIUM CHLORIDE CRYS ER 20 MEQ PO TBCR
40.0000 meq | EXTENDED_RELEASE_TABLET | ORAL | Status: DC
  Administered 2013-01-09: 07:00:00 40 meq via ORAL
  Filled 2013-01-09 (×2): qty 2

## 2013-01-09 MED ORDER — POTASSIUM CHLORIDE ER 10 MEQ PO TBCR: 20.0000 meq | EXTENDED_RELEASE_TABLET | Freq: Every day | ORAL | Status: DC

## 2013-01-09 MED ORDER — POTASSIUM CHLORIDE 10 MEQ/100ML IV SOLN
10.0000 meq | INTRAVENOUS | Status: AC
  Administered 2013-01-09 (×2): 10 meq via INTRAVENOUS
  Filled 2013-01-09 (×2): qty 100

## 2013-01-09 MED ORDER — ENSURE COMPLETE PO LIQD: 237.0000 mL | Freq: Two times a day (BID) | ORAL | Status: DC

## 2013-01-09 MED ORDER — POTASSIUM CHLORIDE CRYS ER 20 MEQ PO TBCR
40.0000 meq | EXTENDED_RELEASE_TABLET | Freq: Two times a day (BID) | ORAL | Status: DC
  Filled 2013-01-09 (×2): qty 2

## 2013-01-09 MED ORDER — MAGNESIUM SULFATE 40 MG/ML IJ SOLN
4.0000 g | Freq: Once | INTRAMUSCULAR | Status: AC
  Administered 2013-01-09: 10:00:00 4 g via INTRAVENOUS
  Filled 2013-01-09: qty 100

## 2013-01-09 MED ORDER — POTASSIUM CHLORIDE CRYS ER 20 MEQ PO TBCR: 40.0000 meq | EXTENDED_RELEASE_TABLET | Freq: Two times a day (BID) | ORAL | Status: DC

## 2013-01-09 MED ORDER — DESMOPRESSIN ACETATE 0.2 MG PO TABS: 0.2000 mg | ORAL_TABLET | Freq: Three times a day (TID) | ORAL | Status: DC

## 2013-01-09 MED ORDER — BOOST / RESOURCE BREEZE PO LIQD: 1.0000 | ORAL | Status: DC

## 2013-01-09 NOTE — Progress Notes (Signed)
CRITICAL VALUE ALERT  Critical value received:  Potassium 2.9  Date of notification:  01/08/13  Time of notification:  0619  Critical value read back:yes  Nurse who received alert:  Virgina Norfolk  MD notified (1st page):  McClung  Time of first page:  0623  MD notified (2nd page):  Time of second page:  Responding MD:  N/A  Time MD responded:  No response via telephone. New orders were placed. Will continue to monitor.

## 2013-01-09 NOTE — Progress Notes (Signed)
Physical Therapy Treatment Patient Details Name: Tahlia Deamer MRN: [id number] DOB: [birth year] Today's Date: 01/09/2013 Time: 0912-0933 PT Time Calculation (min): 21 min  PT Assessment / Plan / Recommendation  History of Present Illness 79 yo female admiited with acute encephalopathy. Hx of pressure ulcers-coccyx, dementia, osteoporosis, bipolar d/o. Pt is longterm resident at SNF.    PT Comments   Able to stand pt and pivot to [facility], recliner. Pt is a high fall risk. Scissors feet with any attempts to walk, pivot-made RN aware of this for when they are assisting pt back to bed.  Recommend SNF.   Follow Up Recommendations  SNF     Does the patient have the potential to tolerate intense rehabilitation     Barriers to Discharge        Equipment Recommendations  None recommended by PT    Recommendations for Other Services    Frequency Min 2X/week   Progress towards PT Goals Progress towards PT goals: Progressing toward goals  Plan Current plan remains appropriate    Precautions / Restrictions Precautions Precautions: Fall Restrictions Weight Bearing Restrictions: No   Pertinent Vitals/Pain Pt denies pain    Mobility  Bed Mobility Bed Mobility: Supine to Sit Supine to Sit: 3: Mod assist Details for Bed Mobility Assistance: Assist for LEs off bed. Utilized bedpad for scooting, positioning.  Transfers Transfers: Sit to Stand;Stand to Sit;Stand Pivot Transfers Sit to Stand: 1: +2 Total assist;From bed;From chair/3-in-1 Sit to Stand: Patient Percentage: 30% Stand to Sit: To chair/3-in-1;1: +2 Total assist Stand to Sit: Patient Percentage: 30% Stand Pivot Transfers: 1: +2 Total assist Stand Pivot Transfers: Patient Percentage: 30% Details for Transfer Assistance: x2. Assist to rise, stabilize, control descent, manevuer with walker. bed>bsc, bsc>recliner. Pt's scissors feet with any attempts at taking steps, pivoting. LOB x 2.  Ambulation/Gait Ambulation/Gait Assistance: Not  tested (comment)    Exercises     PT Diagnosis:    PT Problem List:   PT Treatment Interventions:     PT Goals (current goals can now be found in the care plan section)    Visit Information  Last PT Received On: 01/09/13 Assistance Needed: +2 History of Present Illness: 79 yo female admiited with acute encephalopathy. Hx of pressure ulcers-coccyx, dementia, osteoporosis, bipolar d/o. Pt is longterm resident at SNF.     Subjective Data      Cognition  Cognition Arousal/Alertness: Awake/alert Behavior During Therapy: WFL for tasks assessed/performed Overall Cognitive Status: History of cognitive impairments - at baseline    Balance  Balance Balance Assessed: Yes Static Sitting Balance Static Sitting - Level of Assistance: 4: Min assist Static Sitting - Comment/# of Minutes: LOb x 1 in posterior direction.  Static Standing Balance Static Standing - Balance Support: Bilateral upper extremity supported Static Standing - Level of Assistance: 3: Mod assist Dynamic Standing Balance Dynamic Standing - Balance Support: Bilateral upper extremity supported Dynamic Standing - Level of Assistance: 1: +2 Total assist  End of Session PT - End of Session Activity Tolerance: Patient tolerated treatment well Patient left: in chair;with call bell/phone within reach   GP     Weston Anna, MPT Pager: [phone]

## 2013-01-09 NOTE — Progress Notes (Signed)
CSW called patient's daughter, [name], informed her that the facilities she was interested in are not able to take her mother. She is agreeable to patient returning back to [facility]. Patient cleared for discharge. Packet copied and placed in [place]. Daughter informed of discharge and agreeable. ptar called for transportation. Daughter understands no guarantee of payment.  Aamani Moose C. Meraux MSW, [place]

## 2013-01-09 NOTE — Discharge Summary (Addendum)
Physician Discharge Summary  Rheta Hemmelgarn BMW:[id number] DOB: 02/16/1934 DOA: 01/04/2013  PCP: Hollace Kinnier, DO  Admit date: 01/04/2013 Discharge date: 01/09/2013  Recommendations for Outpatient Follow-up:  1. To SNF for ongoing PT/OT 2. Please repeat BMP in 1 week to ensure potassium and sodium are still wnl 3. Repeat TSH and CBC in 4 weeks.  Adjust Synthroid dose if needed. Further evaluation for anemia if persistent and not already complete. 4. Potassium 79meq daily for 5 days, last day on 01/14/2013, then stop 5. Stop tamiflu and blood pressure medications please 6. Daily blood pressure and if SBP consistently > 140, consider restarting BP medication 7. Recommend stopping medications which contribute to sedation such as narcotics and benzodiazepines.   Discharge Diagnoses:  Principal Problem:   Hypernatremia Active Problems:   Essential hypertension, benign   Dementia   Diabetes insipidus -  lithium-induced   Encephalopathy, metabolic   Dehydration   E. coli UTI   Hypokalemia   Hypothyroidism   Moderate protein-calorie malnutrition   Normocytic anemia   Sacral decubitus ulcer, stage II   Discharge Condition: stable, improved  Diet recommendation: healthy heart  Wt Readings from Last 3 Encounters:  01/04/13 63.5 kg (139 lb 15.9 oz)  09/19/12 71.668 kg (158 lb)  06/03/12 69.4 kg (153 lb)    History of present illness:  79 year old female diabetes insipidus, thyroid cancer, bipolar disorder, hypertension, and dementia presents with one-week history of worsening mental status. The patient is unable to provide any history at this time due to her encephalopathy. All of this history is obtained by speaking with the patient's daughter at the bedside. Over the past week, the patient has had declining mental status to the point where there was difficulty waking the patient up this morning. As a result EMS was activated. According to the daughter, the patient has been receiving  Tamiflu on what appears to be a prophylactic basis at her nursing facility, [facility]. The patient is on day #11 of 14. According to the daughter, the patient has not been tested for influenza, nor has she been told that she has had influenza. There has been no history of fever, coughing, shortness of breath, vomiting, diarrhea, abdominal pain. The patient's daughter also questions whether the patient has been getting her medications properly as a result of her encephalopathy over the last several days.  In ED, the patient was noted to have potassium 3.1, temperature 99.36F, WBC 8.0, lactic acid 1.29. Troponin was negative. EKG shows sinus rhythm with LAFB. The patient was given 1 L of normal saline after which the patient was a little more alert. She was given 1 dose of ceftriaxone.  Hospital Course:   Acute metabolic encephalopathy:  The patient was admitted with urinary tract infection which probably led to her not eating and drinking as well which led to dehydration and hypernatremia. Additionally, she been receiving prophylactic Tamiflu which is also known to cause some confusion. Her Tamiflu was discontinued and she was started on antibiotics for urinary tract infection. She was given free water to address her hypernatremia. Her mental status gradually improved and she is alert, pleasant, interactive on the day of discharge.  Dehydration, likely secondary to poor oral intake and underdosed DDAVP.  She was started on IV fluids and her DDAVP dose which was initially reported as 0.2 mg twice daily was increased to what her actual dose was of 0.4 mg twice daily with 0.2 mg dosed in the mid afternoon.  The pharmacist verified her dose.  With these interventions, her sodium trended down from 152-141 at the period of 3 days.  Urinary tract infection due to Escherichia coli. She completed a five-day course of ceftriaxone. She does not require any further antibiotics.  She complained of some nausea which  was treated with antiemetics and resolved. She is eating well currently.  Hypokalemia, resolved with supplementation but then recurred. Will recommend that she continue potassium supplements for several days upon discharge and have her electrolytes repeated approximately one week. Her magnesium level was also mildly low at 1.7 and she was given several grams of IV magnesium.  Hypothyroidism, her TSH was 4.885.  This may have been mildly elevated because of her acute illness. Recommended this be repeated in approximately 4 weeks. If her level is still not in the range of approximately 1-2, recommend increasing her levothyroxine dose. Her dose was not adjusted during his hospitalization.  Diabetes insipidus, continued DDAVP.  Hypertension, her blood pressure was low normal so her Norvasc and maxide were held. Her blood pressure with probably low secondary to dehydration and urinary tract infection. Despite rehydration, her blood pressure still remains low normal, so her blood pressure medications have been discontinued.  Bipolar disorder, stable.  She continued invega, trazadone, and paxil.  Stage II sacral decubitus ulcer, present at the time of admission. She was evaluated by the wound care nurse.  The wound care nurse recommended continuing a soft silicone foam dressing to the area which be changed twice weekly and as needed. She should have a therapeutic mattress to minimize risk of worsening of this pressure ulcer and development of future pressure ulcers.   Acute renal failure secondary to dehydration. Resolved with IV fluids. Her creatinine trended down from 1.24-0.89.  Normocytic anemia, likely secondary to marrow suppression from acute illness and chronic disease. She may also have been hemodilution from IV fluids. Please repeat CBC in 2-4 weeks, and if she remains anemic, recommend further violation if not already complete.  Moderate protein-calorie malnutrition, she was evaluated by  nutrition who recommended liberalizing her diet and adding supplements  Consultants:  None Procedures:  CXR Antibiotics:  Ceftriaxone 12/28 > 1/1  Discharge Exam: Filed Vitals:   01/08/13 2242  BP: 118/70  Pulse: 81  Temp: 97.5 F (36.4 C)  Resp: 16   Filed Vitals:   01/07/13 2111 01/08/13 0451 01/08/13 1353 01/08/13 2242  BP: 111/60 144/76 132/74 118/70  Pulse: 66 59 61 81  Temp: 98 F (36.7 C) 98 F (36.7 C) 98.2 F (36.8 C) 97.5 F (36.4 C)  TempSrc: Oral Oral Oral Oral  Resp: 18 18 20 16   Height:      Weight:      SpO2: 99% 98% 97% 98%    General: CF, awake, alert, and smiling, No acute distress  HEENT: NCAT, MMM Cardiovascular: RRR, nl S1, S2 no mrg, 2+ pulses, warm extremities  Respiratory: CTAB, no increased WOB  Abdomen: NABS, soft, NT/ND MSK: Normal tone and bulk, no LEE  Neuro: Grossly moves all extremities, 4/5 strength   Discharge Instructions      Discharge Orders   Future Orders Complete By Expires   Call MD for:  difficulty breathing, headache or visual disturbances  As directed    Call MD for:  extreme fatigue  As directed    Call MD for:  hives  As directed    Call MD for:  persistant dizziness or light-headedness  As directed    Call MD for:  persistant nausea and vomiting  As directed    Call MD for:  severe uncontrolled pain  As directed    Call MD for:  temperature >100.4  As directed    Diet general  As directed    Increase activity slowly  As directed        Medication List    STOP taking these medications       ALPRAZolam 0.25 MG tablet  Commonly known as:  XANAX     amLODipine 5 MG tablet  Commonly known as:  NORVASC     HYDROcodone-acetaminophen 5-325 MG per tablet  Commonly known as:  NORCO/VICODIN     MAXZIDE-25 37.5-25 MG per tablet  Generic drug:  triamterene-hydrochlorothiazide     oseltamivir 75 MG capsule  Commonly known as:  TAMIFLU      TAKE these medications       atorvastatin 10 MG tablet   Commonly known as:  LIPITOR  Take 10 mg by mouth at bedtime.     bimatoprost 0.03 % ophthalmic solution  Commonly known as:  LUMIGAN  Place 1 drop into both eyes at bedtime.     calcium-vitamin D 500-200 MG-UNIT per tablet  Commonly known as:  OSCAL WITH D  Take 2 tablets by mouth daily.     cholecalciferol 1000 UNITS tablet  Commonly known as:  VITAMIN D  Take 1,000 Units by mouth daily.     desmopressin 0.2 MG tablet  Commonly known as:  DDAVP  Take 1-2 tablets (0.2-0.4 mg total) by mouth 3 (three) times daily. Take 2 tabs at 10AM, 1 tab at 2PM, and 2 tabs at 10PM     diclofenac sodium 1 % Gel  Commonly known as:  VOLTAREN  - Apply 1 application topically 4 (four) times daily. Apply 4 grams to left shoulder and left knee  -      feeding supplement (ENSURE COMPLETE) Liqd  Take 237 mLs by mouth 2 (two) times daily between meals.     feeding supplement (RESOURCE BREEZE) Liqd  Take 1 Container by mouth daily.     hydrOXYzine 25 MG tablet  Commonly known as:  ATARAX/VISTARIL  Take 10 mg by mouth every 12 (twelve) hours as needed. anxiety     lansoprazole 30 MG capsule  Commonly known as:  PREVACID  Take 30 mg by mouth 2 (two) times daily.     levothyroxine 112 MCG tablet  Commonly known as:  SYNTHROID, LEVOTHROID  Take 112 mcg by mouth daily before breakfast.     multivitamin tablet  Take 1 tablet by mouth daily.     paliperidone 3 MG 24 hr tablet  Commonly known as:  INVEGA  Take 1.5 mg by mouth at bedtime.     PARoxetine 20 MG tablet  Commonly known as:  PAXIL  Take 20 mg by mouth every morning.     polyethylene glycol packet  Commonly known as:  MIRALAX / GLYCOLAX  Take 17 g by mouth 2 (two) times daily.     potassium chloride 10 MEQ tablet  Commonly known as:  K-DUR  Take 2 tablets (20 mEq total) by mouth daily.     sennosides-docusate sodium 8.6-50 MG tablet  Commonly known as:  SENOKOT-S  Take 2 tablets by mouth at bedtime.     traZODone 50 MG  tablet  Commonly known as:  DESYREL  Take 25 mg by mouth at bedtime.       Follow-up Information   Follow up with REED, TIFFANY, DO. Schedule an  appointment as soon as possible for a visit in 2 weeks.   Specialty:  Geriatric Medicine   Contact information:   [address] [place] [place] [address] [phone]        The results of significant diagnostics from this hospitalization (including imaging, microbiology, ancillary and laboratory) are listed below for reference.    Significant Diagnostic Studies: Dg Chest Port 1 View  01/04/2013   CLINICAL DATA:  Dyspnea  EXAM: PORTABLE CHEST - 1 VIEW  COMPARISON:  PA and lateral chest of July 11, 2012.  FINDINGS: An AP semi-erect portable view of the chest reveals the lungs to be adequately inflated. There is no focal infiltrate. The cardiopericardial silhouette is mildly enlarged though stable. There is stable soft tissue prominence in the right peritracheal region. There is tortuosity of the descending thoracic aorta. There is no pleural effusion or pneumothorax. There are surgical clips at the base of the neck. There are severe degenerative changes of the left shoulder.  IMPRESSION: There is no evidence of pneumonia. I cannot exclude low-grade compensated CHF in the appropriate clinical setting. When the patient can tolerate the procedure, a PA and lateral chest x-ray with deep inspiration would be of value.   Electronically Signed   By: David  Martinique   On: 01/04/2013 12:51    Microbiology: Recent Results (from the past 240 hour(s))  URINE CULTURE     Status: None   Collection Time    01/04/13 12:34 PM      Result Value Range Status   Specimen Description URINE, CLEAN CATCH   Final   Special Requests NONE   Final   Culture  Setup Time     Final   Value: 01/04/2013 20:27     Performed at [facility]     Final   Value: >=100,000 COLONIES/ML     Performed at [facility]   Culture     Final   Value:  ESCHERICHIA COLI     Performed at [facility]   Report Status 01/06/2013 FINAL   Final   Organism ID, Bacteria ESCHERICHIA COLI   Final  MRSA PCR SCREENING     Status: None   Collection Time    01/04/13  4:42 PM      Result Value Range Status   MRSA by PCR NEGATIVE  NEGATIVE Final   Comment:            The GeneXpert MRSA Assay (FDA     approved for NASAL specimens     only), is one component of a     comprehensive MRSA colonization     surveillance program. It is not     intended to diagnose MRSA     infection nor to guide or     monitor treatment for     MRSA infections.     Labs: Basic Metabolic Panel:  Recent Labs Lab 01/05/13 0521 01/06/13 0523 01/07/13 0537 01/07/13 1437 01/08/13 0752 01/09/13 0525  NA 143 151* 152* 149* 144 141  K 3.5 4.0 3.7 3.7 4.4 2.9*  CL 108 116* 117* 115* 109 106  CO2 27 24 23 22 22 23   GLUCOSE 90 118* 103* 134* 104* 111*  BUN 19 11 10 12 11 8   CREATININE 1.24* 0.97 1.03 0.97 0.93 0.89  CALCIUM 8.6 9.0 8.7 8.8 8.7 8.7  MG 2.3  --   --   --   --  1.7   Liver Function  Tests:  Recent Labs Lab 01/04/13 1236  AST 15  ALT 7  ALKPHOS 89  BILITOT 0.5  PROT 7.7  ALBUMIN 3.2*   No results found for this basename: LIPASE, AMYLASE,  in the last 168 hours No results found for this basename: AMMONIA,  in the last 168 hours CBC:  Recent Labs Lab 01/04/13 1236 01/05/13 0521 01/06/13 0523 01/07/13 0537  WBC 8.0 5.8 5.7 6.0  NEUTROABS 6.6  --   --   --   HGB 13.8 12.3 12.6 10.7*  HCT 42.8 38.4 39.8 34.9*  MCV 90.9 92.8 93.2 95.1  PLT 335 329 330 325   Cardiac Enzymes:  Recent Labs Lab 01/04/13 1236  TROPONINI <0.30   BNP: BNP (last 3 results) No results found for this basename: PROBNP,  in the last 8760 hours CBG:  Recent Labs Lab 01/04/13 1642  GLUCAP 92    Time coordinating discharge: 45 minutes  Signed:  Kacie Huxtable  [facility] 01/09/2013, 9:56 AM

## 2013-01-13 ENCOUNTER — Encounter: Payer: Self-pay | Admitting: Internal Medicine

## 2013-01-13 ENCOUNTER — Non-Acute Institutional Stay (SKILLED_NURSING_FACILITY): Payer: Medicare Other | Admitting: Internal Medicine

## 2013-01-13 DIAGNOSIS — N179 Acute kidney failure, unspecified: Secondary | ICD-10-CM

## 2013-01-13 DIAGNOSIS — E232 Diabetes insipidus: Secondary | ICD-10-CM

## 2013-01-13 DIAGNOSIS — G9341 Metabolic encephalopathy: Secondary | ICD-10-CM

## 2013-01-13 DIAGNOSIS — B9620 Unspecified Escherichia coli [E. coli] as the cause of diseases classified elsewhere: Secondary | ICD-10-CM

## 2013-01-13 DIAGNOSIS — L89152 Pressure ulcer of sacral region, stage 2: Secondary | ICD-10-CM

## 2013-01-13 DIAGNOSIS — E876 Hypokalemia: Secondary | ICD-10-CM

## 2013-01-13 DIAGNOSIS — D649 Anemia, unspecified: Secondary | ICD-10-CM

## 2013-01-13 DIAGNOSIS — I10 Essential (primary) hypertension: Secondary | ICD-10-CM

## 2013-01-13 DIAGNOSIS — F3130 Bipolar disorder, current episode depressed, mild or moderate severity, unspecified: Secondary | ICD-10-CM

## 2013-01-13 DIAGNOSIS — A498 Other bacterial infections of unspecified site: Secondary | ICD-10-CM

## 2013-01-13 DIAGNOSIS — L89109 Pressure ulcer of unspecified part of back, unspecified stage: Secondary | ICD-10-CM

## 2013-01-13 DIAGNOSIS — F319 Bipolar disorder, unspecified: Secondary | ICD-10-CM

## 2013-01-13 DIAGNOSIS — E039 Hypothyroidism, unspecified: Secondary | ICD-10-CM

## 2013-01-13 DIAGNOSIS — E860 Dehydration: Secondary | ICD-10-CM

## 2013-01-13 DIAGNOSIS — E440 Moderate protein-calorie malnutrition: Secondary | ICD-10-CM

## 2013-01-13 DIAGNOSIS — L8992 Pressure ulcer of unspecified site, stage 2: Secondary | ICD-10-CM

## 2013-01-13 DIAGNOSIS — N390 Urinary tract infection, site not specified: Secondary | ICD-10-CM

## 2013-01-13 NOTE — Assessment & Plan Note (Signed)
Sec to dehydration, resolved;Cr 1.24 to 0.89

## 2013-01-13 NOTE — Assessment & Plan Note (Signed)
Felt secondary to poor po intake and underdosed DDAVP, which dose was corrected to TID - 0.4 , 0.2 and 0.4 mg.

## 2013-01-13 NOTE — Assessment & Plan Note (Signed)
BP was low normal so BP meds held;if SBP.140 need to restart the norvasc ot maxide

## 2013-01-13 NOTE — Assessment & Plan Note (Signed)
Stable-continue invega, trazadone and pxil

## 2013-01-13 NOTE — Assessment & Plan Note (Signed)
Regular diet plus 2 supplements

## 2013-01-13 NOTE — Assessment & Plan Note (Signed)
Was supplemented, corrected then recurred and supplemented again for 5 days on d/c;lkabs should be being drawn for recheck in several days

## 2013-01-13 NOTE — Assessment & Plan Note (Signed)
Repeat CBC in several weeks

## 2013-01-13 NOTE — Assessment & Plan Note (Signed)
Hospital states present on admission, wound nurse here says, no; wound care per routine here

## 2013-01-13 NOTE — Assessment & Plan Note (Signed)
TSH elevated at 4.885; elevated sec to acute illness; to be rechecked 4 weeks with increase n med if still elevated

## 2013-01-13 NOTE — Progress Notes (Signed)
MRN: [id number] Name: [name]  Sex: female Age: 79 y.o. DOB: Apr 08, 1934  PSC #: Ronni Rumble Facility/Room:207B Level Of Care: SNF Provider: Merrilee Seashore D Emergency Contacts: Extended Emergency Contact Information Primary Emergency Contact: Buchanan,[name] Address: [address]          [place], [place] Home Phone: [phone] Work Phone: [phone] Relation: Daughter  Code Status: FULL  Allergies: Lithium; Penicillins; and Sulfa antibiotics  Chief Complaint  Patient presents with  . nursing home admission    HPI: Patient is 79 y.o. female who is being admitted after being hospitalized for MS change, hypernatremia and dehydration.  Past Medical History  Diagnosis Date  . Thyroid cancer   . Ulcer     chronic  . Depression   . Vitamin D deficiency   . Anxiety   . Malaise and fatigue   . Osteoarthritis   . Kidney disease   . Pneumonia   . Dementia   . Alzheimer disease   . Hypothyroidism   . Bipolar 1 disorder   . Dehydration   . Hypertension   . Diabetes mellitus     Past Surgical History  Procedure Laterality Date  . Esophagogastroduodenoscopy  02/23/2011    Procedure: ESOPHAGOGASTRODUODENOSCOPY (EGD);  Surgeon: Theda Belfast, MD;  [facility];  Service: Endoscopy;  Laterality: N/A;  . Abdominal hysterectomy    . Foot surgery    . Retinal detachment surgery    . Thyroidectomy        Medication List       This list is accurate as of: 01/13/13 11:08 AM.  Always use your most recent med list.               atorvastatin 10 MG tablet  Commonly known as:  LIPITOR  Take 10 mg by mouth at bedtime.     bimatoprost 0.03 % ophthalmic solution  Commonly known as:  LUMIGAN  Place 1 drop into both eyes at bedtime.     calcium-vitamin D 500-200 MG-UNIT per tablet  Commonly known as:  OSCAL WITH D  Take 2 tablets by mouth daily.     cholecalciferol 1000 UNITS tablet  Commonly known as:  VITAMIN D  Take  1,000 Units by mouth daily.     desmopressin 0.2 MG tablet  Commonly known as:  DDAVP  Take 1-2 tablets (0.2-0.4 mg total) by mouth 3 (three) times daily. Take 2 tabs at 10AM, 1 tab at 2PM, and 2 tabs at 10PM     diclofenac sodium 1 % Gel  Commonly known as:  VOLTAREN  - Apply 1 application topically 4 (four) times daily. Apply 4 grams to left shoulder and left knee  -      feeding supplement (ENSURE COMPLETE) Liqd  Take 237 mLs by mouth 2 (two) times daily between meals.     feeding supplement (RESOURCE BREEZE) Liqd  Take 1 Container by mouth daily.     hydrOXYzine 25 MG tablet  Commonly known as:  ATARAX/VISTARIL  Take 10 mg by mouth every 12 (twelve) hours as needed. anxiety     lansoprazole 30 MG capsule  Commonly known as:  PREVACID  Take 30 mg by mouth 2 (two) times daily.     levothyroxine 112 MCG tablet  Commonly known as:  SYNTHROID, LEVOTHROID  Take 112 mcg by mouth daily before breakfast.     multivitamin tablet  Take 1 tablet by mouth daily.     paliperidone 3 MG 24 hr  tablet  Commonly known as:  INVEGA  Take 1.5 mg by mouth at bedtime.     PARoxetine 20 MG tablet  Commonly known as:  PAXIL  Take 20 mg by mouth every morning.     polyethylene glycol packet  Commonly known as:  MIRALAX / GLYCOLAX  Take 17 g by mouth 2 (two) times daily.     potassium chloride 10 MEQ tablet  Commonly known as:  K-DUR  Take 2 tablets (20 mEq total) by mouth daily.     sennosides-docusate sodium 8.6-50 MG tablet  Commonly known as:  SENOKOT-S  Take 2 tablets by mouth at bedtime.     traZODone 50 MG tablet  Commonly known as:  DESYREL  Take 25 mg by mouth at bedtime.        No orders of the defined types were placed in this encounter.    Immunization History  Administered Date(s) Administered  . Influenza Whole 10/16/2012  . Pneumococcal Conjugate-13 09/08/2007  . Pneumococcal-Unspecified 02/05/2011  . Tdap 09/08/2007  . Zoster 09/08/2007    History   Substance Use Topics  . Smoking status: Never Smoker   . Smokeless tobacco: Not on file  . Alcohol Use: No    Family history is noncontributory    Review of Systems  DATA OBTAINED: from patient NO C/O FOR ANYTHING;DEMENTIA, NOR SURE IF RELIABLE;NUESING HAS NO CONCERNS GENERAL: Feels well no fevers, fatigue, appetite changes SKIN: No itching, rash or wounds EYES: No eye pain, redness, discharge EARS: No earache, tinnitus, change in hearing NOSE: No congestion, drainage or bleeding  MOUTH/THROAT: No mouth or tooth pain, No sore throat, No difficulty chewing or swallowing  RESPIRATORY: No cough, wheezing, SOB CARDIAC: No chest pain, palpitations, lower extremity edema  GI: No abdominal pain, No N/V/D or constipation, No heartburn or reflux  GU: No dysuria, frequency or urgency, or incontinence  MUSCULOSKELETAL: No unrelieved bone/joint pain NEUROLOGIC: No headache, dizziness or focal weakness PSYCHIATRIC: No overt anxiety or sadness. Sleeps well. No behavior issue.   Filed Vitals:   01/13/13 0957  BP: 134/67  Pulse: 77  Temp: 97.9 F (36.6 C)  Resp: 18    Physical Exam  GENERAL APPEARANCE: Alert, MIN conversant. Appropriately groomed. No acute distress.  SKIN: No diaphoresis rash, HEAD: Normocephalic, atraumatic  EYES: Conjunctiva/lids clear. Pupils round, reactive. EOMs intact.  EARS: External exam WNL, canals clear. Hearing grossly normal.  NOSE: No deformity or discharge.  MOUTH/THROAT: Lips w/o lesions.  RESPIRATORY: Breathing is even, unlabored. Lung sounds are clear   CARDIOVASCULAR: Heart RRR no murmurs, rubs or gallops. No peripheral edema.  GASTROINTESTINAL: Abdomen is soft, non-tender, not distended w/ normal bowel sounds. GENITOURINARY: Bladder non tender, not distended  MUSCULOSKELETAL: No abnormal joints or musculature NEUROLOGIC: Oriented X3. Cranial nerves 2-12 grossly intact. Moves all extremities no tremor. PSYCHIATRIC: Mood and affect appropriate to  situation, no behavioral issues  Patient Active Problem List   Diagnosis Date Noted  . Bipolar 1 disorder, depressed 01/13/2013  . ARF (acute renal failure) 01/13/2013  . Moderate protein-calorie malnutrition 01/09/2013  . Normocytic anemia 01/09/2013  . Sacral decubitus ulcer, stage II 01/09/2013  . Hypernatremia 01/07/2013  . Encephalopathy, metabolic 16/10/9602  . Dehydration 01/04/2013  . E. coli UTI 01/04/2013  . Hypokalemia 01/04/2013  . Hypothyroidism 01/04/2013  . Dementia without behavioral disturbance 12/08/2012  . Unspecified vitamin D deficiency 07/21/2012  . Anemia, unspecified 06/19/2012  . Dyslipidemia 06/03/2012  . Osteoarthritis 06/03/2012  . Manic depressive disorder 04/11/2012  .  Unspecified constipation 04/11/2012  . GERD (gastroesophageal reflux disease) 04/11/2012  . Osteoporosis, unspecified 04/11/2012  . Diabetes insipidus -  lithium-induced 09/07/2011  . History of iron deficiency anemia 09/07/2011  . Acute blood loss anemia 09/06/2011  . Essential hypertension, benign 02/01/2011  . Dementia 02/01/2011  . Depressive disorder, not elsewhere classified 02/01/2011  . Unspecified hypothyroidism 02/01/2011    CBC    Component Value Date/Time   WBC 6.0 01/07/2013 0537   RBC 3.67* 01/07/2013 0537   RBC 4.43 09/06/2011 1955   HGB 10.7* 01/07/2013 0537   HCT 34.9* 01/07/2013 0537   PLT 325 01/07/2013 0537   MCV 95.1 01/07/2013 0537   LYMPHSABS 0.6* 01/04/2013 1236   MONOABS 0.7 01/04/2013 1236   EOSABS 0.0 01/04/2013 1236   BASOSABS 0.0 01/04/2013 1236    CMP     Component Value Date/Time   NA 141 01/09/2013 0525   K 2.9* 01/09/2013 0525   CL 106 01/09/2013 0525   CO2 23 01/09/2013 0525   GLUCOSE 111* 01/09/2013 0525   BUN 8 01/09/2013 0525   CREATININE 0.89 01/09/2013 0525   CALCIUM 8.7 01/09/2013 0525   CALCIUM 9.7 05/11/2008 0533   PROT 7.7 01/04/2013 1236   ALBUMIN 3.2* 01/04/2013 1236   AST 15 01/04/2013 1236   ALT 7 01/04/2013 1236   ALKPHOS 89  01/04/2013 1236   BILITOT 0.5 01/04/2013 1236   GFRNONAA 61* 01/09/2013 0525   GFRAA 71* 01/09/2013 0525    Assessment and Plan  Encephalopathy, metabolic Resolved;felt secondary to UTI, dehydration and hypernatremia; maybe tamiflu for prophylaxis as well  Dehydration Felt secondary to poor po intake and underdosed DDAVP, which dose was corrected to TID - 0.4 , 0.2 and 0.4 mg.  E. coli UTI Treated foir 5 days with rocephin and resolved.  Hypokalemia Was supplemented, corrected then recurred and supplemented again for 5 days on d/c;lkabs should be being drawn for recheck in several days  Hypothyroidism TSH elevated at 4.885; elevated sec to acute illness; to be rechecked 4 weeks with increase n med if still elevated  Diabetes insipidus -  lithium-induced Continue DDAVP at (0.4mg , 0.2 mg and 0.4 mg -TID daily)  Essential hypertension, benign BP was low normal so BP meds held;if SBP.140 need to restart the norvasc ot maxide  Bipolar 1 disorder, depressed Stable-continue invega, trazadone and pxil  Sacral decubitus ulcer, stage II Hospital states present on admission, wound nurse here says, no; wound care per routine here  ARF (acute renal failure) Sec to dehydration, resolved;Cr 1.24 to 0.89  Normocytic anemia Repeat CBC in several weeks  Moderate protein-calorie malnutrition Regular diet plus 2 supplements    Hennie Duos, MD

## 2013-01-13 NOTE — Assessment & Plan Note (Signed)
Continue DDAVP at (0.4mg , 0.2 mg and 0.4 mg -TID daily)

## 2013-01-13 NOTE — Assessment & Plan Note (Signed)
Treated foir 5 days with rocephin and resolved.

## 2013-01-13 NOTE — Assessment & Plan Note (Signed)
Resolved;felt secondary to UTI, dehydration and hypernatremia; maybe tamiflu for prophylaxis as well

## 2013-01-21 ENCOUNTER — Non-Acute Institutional Stay (SKILLED_NURSING_FACILITY): Payer: Medicare Other | Admitting: Internal Medicine

## 2013-01-21 DIAGNOSIS — F3130 Bipolar disorder, current episode depressed, mild or moderate severity, unspecified: Secondary | ICD-10-CM

## 2013-01-21 DIAGNOSIS — F0390 Unspecified dementia without behavioral disturbance: Secondary | ICD-10-CM

## 2013-01-21 DIAGNOSIS — E232 Diabetes insipidus: Secondary | ICD-10-CM

## 2013-01-21 DIAGNOSIS — E440 Moderate protein-calorie malnutrition: Secondary | ICD-10-CM

## 2013-01-21 DIAGNOSIS — F319 Bipolar disorder, unspecified: Secondary | ICD-10-CM

## 2013-01-21 DIAGNOSIS — G9341 Metabolic encephalopathy: Secondary | ICD-10-CM

## 2013-01-21 DIAGNOSIS — E039 Hypothyroidism, unspecified: Secondary | ICD-10-CM

## 2013-01-21 NOTE — Progress Notes (Signed)
Patient ID: [name], female   DOB: 02-13-1934, 79 y.o.   MRN: [id number]  Location:  [facility] Provider:  Rexene Edison. Mariea Clonts, D.O., C.M.D.  Code Status:  Full code  Chief Complaint  Patient presents with  . Acute Visit    decreased mentation    HPI:  79 yo white female long term care resident with manic depression with psychosis, hyperlipidemia, hypothyroidism, and Alzheimer's disease seen for acute visit due to decreased mentation.  Her daughter is concerned that she is more lethargic.  She has missed some medicines and meals b/c she was too sleepy to swallow.    Review of Systems:  Review of Systems  Constitutional: Positive for malaise/fatigue. Negative for fever.  HENT: Negative for congestion.   Respiratory: Negative for shortness of breath.   Cardiovascular: Negative for chest pain.  Gastrointestinal: Negative for abdominal pain and constipation.  Genitourinary: Negative for dysuria and frequency.       Urinary incontinence unchanged  Musculoskeletal: Negative for falls.  Skin: Negative for rash.  Neurological: Negative for dizziness and headaches.  Endo/Heme/Allergies: Bruises/bleeds easily.  Psychiatric/Behavioral: Positive for depression and memory loss. The patient is nervous/anxious. The patient does not have insomnia.     Medications: Patient's Medications  New Prescriptions   CEFUROXIME (CEFTIN) 250 MG TABLET    Take 1 tablet (250 mg total) by mouth 2 (two) times daily with a meal.  Previous Medications   ATORVASTATIN (LIPITOR) 10 MG TABLET    Take 10 mg by mouth at bedtime.    BIMATOPROST (LUMIGAN) 0.03 % OPHTHALMIC SOLUTION    Place 1 drop into both eyes at bedtime.   CALCIUM-VITAMIN D (OSCAL WITH D) 500-200 MG-UNIT PER TABLET    Take 2 tablets by mouth daily.    CHOLECALCIFEROL (VITAMIN D) 1000 UNITS TABLET    Take 1,000 Units by mouth daily.   DESMOPRESSIN (DDAVP) 0.2 MG TABLET    Take 0.2-0.4 mg by mouth 3 (three) times daily. Takes 2  tablets at 10 am 1 tablet at 2 pm and 2 tablets at 10 pm   DICLOFENAC SODIUM (VOLTAREN) 1 % GEL    Apply 1 application topically 4 (four) times daily. Apply 4 grams to left shoulder and left knee    HYDROXYZINE (ATARAX/VISTARIL) 25 MG TABLET    Take 10 mg by mouth every 12 (twelve) hours as needed. anxiety   LANSOPRAZOLE (PREVACID) 30 MG CAPSULE    Take 30 mg by mouth 2 (two) times daily.   LEVOTHYROXINE (SYNTHROID, LEVOTHROID) 125 MCG TABLET    Take 125 mcg by mouth daily before breakfast.   MULTIPLE VITAMIN (MULTIVITAMIN) TABLET    Take 1 tablet by mouth daily.     NUTRITIONAL SUPPLEMENTS (TWOCAL HN) LIQD    Take 120 mLs by mouth 3 (three) times daily.   PALIPERIDONE (INVEGA) 1.5 MG TB24    Take 1 tablet by mouth daily.   PAROXETINE (PAXIL) 20 MG TABLET    Take 20 mg by mouth every morning.   POLYETHYLENE GLYCOL (MIRALAX / GLYCOLAX) PACKET    Take 17 g by mouth 2 (two) times daily.   SENNOSIDES-DOCUSATE SODIUM (SENOKOT-S) 8.6-50 MG TABLET    Take 2 tablets by mouth at bedtime.   TRAZODONE (DESYREL) 50 MG TABLET    Take 25 mg by mouth at bedtime.   Modified Medications   No medications on file  Discontinued Medications   DESMOPRESSIN (DDAVP) 0.2 MG TABLET    Take 1-2 tablets (0.2-0.4 mg total)  by mouth 3 (three) times daily. Take 2 tabs at 10AM, 1 tab at 2PM, and 2 tabs at 10PM   FEEDING SUPPLEMENT, ENSURE COMPLETE, (ENSURE COMPLETE) LIQD    Take 237 mLs by mouth 2 (two) times daily between meals.   FEEDING SUPPLEMENT, RESOURCE BREEZE, (RESOURCE BREEZE) LIQD    Take 1 Container by mouth daily.   LEVOTHYROXINE (SYNTHROID, LEVOTHROID) 112 MCG TABLET    Take 112 mcg by mouth daily before breakfast.   PALIPERIDONE (INVEGA) 3 MG 24 HR TABLET    Take 1.5 mg by mouth at bedtime.    POTASSIUM CHLORIDE (K-DUR) 10 MEQ TABLET    Take 2 tablets (20 mEq total) by mouth daily.    Physical Exam: Filed Vitals:   01/21/13 1150  BP: 137/77  Pulse: 79  Temp: 96.7 F (35.9 C)  Resp: 18  Height: 5\' 4"   (1.626 m)  Weight: 152 lb (68.947 kg)  SpO2: 97%   Physical Exam  Constitutional:  Lethargic, but can be aroused, answers questions, denies anything, but to say "I am depressed" which she always says  Cardiovascular: Normal rate, regular rhythm, normal heart sounds and intact distal pulses.   Pulmonary/Chest: Effort normal and breath sounds normal. No respiratory distress.  Abdominal: Soft. Bowel sounds are normal. She exhibits no distension and no mass. There is no tenderness.  Musculoskeletal: She exhibits no edema and no tenderness.  Skin: Skin is warm and dry.    Labs reviewed: Basic Metabolic Panel:  Recent Labs  01/05/13 0521  01/08/13 0752 01/09/13 0525 05/07/13 1300  NA 143  < > 144 141 149*  K 3.5  < > 4.4 2.9* 4.8  CL 108  < > 109 106 109  CO2 27  < > 22 23 25   GLUCOSE 90  < > 104* 111* 92  BUN 19  < > 11 8 18   CREATININE 1.24*  < > 0.93 0.89 1.07  CALCIUM 8.6  < > 8.7 8.7 9.7  MG 2.3  --   --  1.7  --   < > = values in this interval not displayed.  Liver Function Tests:  Recent Labs  07/21/12 1210 01/04/13 1236 05/07/13 1300  AST 23 15 29   ALT 15 7 9   ALKPHOS 94 89 103  BILITOT 0.4 0.5 0.3  PROT 8.1 7.7 7.2  ALBUMIN 3.7 3.2* 3.4*    CBC:  Recent Labs  01/04/13 1236  01/06/13 0523 01/07/13 0537 05/07/13 1300  WBC 8.0  < > 5.7 6.0 7.2  NEUTROABS 6.6  --   --   --  5.0  HGB 13.8  < > 12.6 10.7* 13.2  HCT 42.8  < > 39.8 34.9* 42.6  MCV 90.9  < > 93.2 95.1 96.8  PLT 335  < > 330 325 265  < > = values in this interval not displayed. 01/16/13:  Na 143, K 3.5, BUN 10, cr 0.98, glucose 83, Ca 10  Assessment/Plan 1. Encephalopathy, metabolic -suspect her Na level may be altered again -staff are not aware of any recent changes to her psychiatric meds to cause this -push po fluids cbc, bmp, prealbumin next draw called to me VS q shift for 3 days TSH in 3 wks Must hydrate her or it will look like she has a UTI even if she does not due to  colonization  2. Diabetes insipidus -was due to prior lithium use -f/u bmp, cont DDAVP  3. Bipolar 1 disorder, depressed -cont invega, paxil, keep  f/u with outside psychiatrist as planned by her daughter--need copies of his notes  4. Dementia without behavioral disturbance -Palliative care consult ([facility]) to help with goals of care--I have had difficulty helping her daughter to understand her mom's disease progression  5. Hypothyroidism -f/u tsh and cont synthroid--keeps getting changed in the hospital when it was just adjusted here so levels are never right  6. Moderate protein-calorie malnutrition -f/u prealbumin as she is developing some edema that appears to be due to her nutritional status  Family/ staff Communication: discussed with nursing staff and pt's daughter, [name]  Goals of care: remains full code, but [name] wants comfort, but then wants her sent to the hospital right away when she has a bad day  Labs/tests ordered:  Cbc, bmp, tsh, prealbumin

## 2013-01-23 ENCOUNTER — Other Ambulatory Visit: Payer: Self-pay | Admitting: Internal Medicine

## 2013-01-23 NOTE — Progress Notes (Signed)
Received call with lab results from 01/21/13:   Na 143, K 4.1, BUN 13, cr 1.01, glucose 111, wbc 7.2, h/h 11.3/36.5, plts 288  Pt also is more awake, hsa been taking her meds when crushed.  Is drinking more liquids, not eating a lot, but is working with [facility]ative [facility].

## 2013-01-26 ENCOUNTER — Encounter: Payer: Self-pay | Admitting: Internal Medicine

## 2013-04-01 ENCOUNTER — Non-Acute Institutional Stay (SKILLED_NURSING_FACILITY): Payer: Medicare Other | Admitting: Internal Medicine

## 2013-04-01 DIAGNOSIS — F3130 Bipolar disorder, current episode depressed, mild or moderate severity, unspecified: Secondary | ICD-10-CM

## 2013-04-01 DIAGNOSIS — F0390 Unspecified dementia without behavioral disturbance: Secondary | ICD-10-CM

## 2013-04-01 DIAGNOSIS — F319 Bipolar disorder, unspecified: Secondary | ICD-10-CM

## 2013-04-01 DIAGNOSIS — E232 Diabetes insipidus: Secondary | ICD-10-CM

## 2013-04-01 DIAGNOSIS — E039 Hypothyroidism, unspecified: Secondary | ICD-10-CM

## 2013-04-01 NOTE — Progress Notes (Signed)
Patient ID: [name], female   DOB: 04-30-1934, 79 y.o.   MRN: [id number]  Location:  [facility] Provider:  Rexene Edison. Mariea Clonts, D.O., C.M.D.  Code Status:  Full code   Chief Complaint  Patient presents with  . Acute Visit    difficulty feeding herself now    HPI:  79 yo female with AD, diabetes insipidus, major depression with psychosis was seen for an acute visit. Difficulty using her hands properly to eat--is holding the utensils with a grip instead of proper form--also did this with her toothbrush when I watched her.  No other neurologic changes. Her daughter, [name], asked that I see her due to concerns that she may have had a stroke causing these changes.  Daughter notes two missed antipsychotic pills last week.  Review of Systems:  Review of Systems  Constitutional: Positive for malaise/fatigue. Negative for fever.  Respiratory: Negative for cough and shortness of breath.   Cardiovascular: Negative for chest pain and leg swelling.  Gastrointestinal: Negative for constipation, blood in stool and melena.  Genitourinary: Negative for dysuria.  Musculoskeletal: Negative for falls.       Lost coordination of hands  Neurological: Positive for focal weakness and weakness.  Psychiatric/Behavioral: Positive for depression and memory loss.    Medications: Patient's Medications  New Prescriptions   No medications on file  Previous Medications   ATORVASTATIN (LIPITOR) 10 MG TABLET    Take 10 mg by mouth at bedtime.    BIMATOPROST (LUMIGAN) 0.03 % OPHTHALMIC SOLUTION    Place 1 drop into both eyes at bedtime.   CALCIUM-VITAMIN D (OSCAL WITH D) 500-200 MG-UNIT PER TABLET    Take 2 tablets by mouth daily.    CHOLECALCIFEROL (VITAMIN D) 1000 UNITS TABLET    Take 1,000 Units by mouth daily.   DESMOPRESSIN (DDAVP) 0.2 MG TABLET    1 by mouth one time a day, 2 by mouth two times a day (Nursing Home Instructions)   DICLOFENAC SODIUM (VOLTAREN) 1 % GEL    Apply 1  application topically 4 (four) times daily. Apply 4 grams to left shoulder and left knee    HYDROXYZINE (ATARAX/VISTARIL) 10 MG TABLET    Take 10 mg by mouth every 12 (twelve) hours. For Anxiety   LANSOPRAZOLE (PREVACID) 30 MG CAPSULE    Take 30 mg by mouth 2 (two) times daily.   LEVOTHYROXINE (SYNTHROID, LEVOTHROID) 125 MCG TABLET    Take 125 mcg by mouth daily before breakfast.   MULTIPLE VITAMIN (MULTIVITAMIN) TABLET    Take 1 tablet by mouth daily.     PALIPERIDONE (INVEGA) 1.5 MG TB24    Take 1 tablet by mouth daily.   PAROXETINE (PAXIL) 20 MG TABLET    Take 20 mg by mouth every morning.   POLYETHYLENE GLYCOL (MIRALAX / GLYCOLAX) PACKET    Take 17 g by mouth 2 (two) times daily.   SENNOSIDES-DOCUSATE SODIUM (SENOKOT-S) 8.6-50 MG TABLET    Take 2 tablets by mouth at bedtime.   TRAZODONE (DESYREL) 50 MG TABLET    Take 25 mg by mouth at bedtime.   Modified Medications   No medications on file  Discontinued Medications   DESMOPRESSIN (DDAVP) 0.2 MG TABLET    Take 1-2 tablets (0.2-0.4 mg total) by mouth 3 (three) times daily. Take 2 tabs at 10AM, 1 tab at 2PM, and 2 tabs at 10PM   FEEDING SUPPLEMENT, ENSURE COMPLETE, (ENSURE COMPLETE) LIQD    Take 237 mLs by mouth 2 (two)  times daily between meals.   FEEDING SUPPLEMENT, RESOURCE BREEZE, (RESOURCE BREEZE) LIQD    Take 1 Container by mouth daily.   HYDROXYZINE (ATARAX/VISTARIL) 25 MG TABLET    Take 10 mg by mouth every 12 (twelve) hours as needed. anxiety   LEVOTHYROXINE (SYNTHROID, LEVOTHROID) 112 MCG TABLET    Take 112 mcg by mouth daily before breakfast.   PALIPERIDONE (INVEGA) 3 MG 24 HR TABLET    Take 1.5 mg by mouth at bedtime.    POTASSIUM CHLORIDE (K-DUR) 10 MEQ TABLET    Take 2 tablets (20 mEq total) by mouth daily.    Physical Exam: Filed Vitals:   10/11/13 1106  BP: 108/84  Pulse: 82  Temp: 98.8 F (37.1 C)  Resp: 18  SpO2: 97%   Physical Exam  Cardiovascular: Normal rate, regular rhythm, normal heart sounds and intact  distal pulses.   Pulmonary/Chest: Effort normal and breath sounds normal. No respiratory distress. She has no rales.  Abdominal: Soft. Bowel sounds are normal. She exhibits no distension and no mass. There is no tenderness.  Musculoskeletal: Normal range of motion.  No decrease in handgrips during exam  Neurological: She is alert.  Psychiatric:  Affect unchanged from usual   Labs reviewed: Basic Metabolic Panel:  Recent Labs  01/05/13 0521  01/08/13 0752 01/09/13 0525 05/07/13 1300  NA 143  < > 144 141 149*  K 3.5  < > 4.4 2.9* 4.8  CL 108  < > 109 106 109  CO2 27  < > 22 23 25   GLUCOSE 90  < > 104* 111* 92  BUN 19  < > 11 8 18   CREATININE 1.24*  < > 0.93 0.89 1.07  CALCIUM 8.6  < > 8.7 8.7 9.7  MG 2.3  --   --  1.7  --   < > = values in this interval not displayed.  Liver Function Tests:  Recent Labs  01/04/13 1236 05/07/13 1300  AST 15 29  ALT 7 9  ALKPHOS 89 103  BILITOT 0.5 0.3  PROT 7.7 7.2  ALBUMIN 3.2* 3.4*    CBC:  Recent Labs  01/04/13 1236  01/06/13 0523 01/07/13 0537 05/07/13 1300  WBC 8.0  < > 5.7 6.0 7.2  NEUTROABS 6.6  --   --   --  5.0  HGB 13.8  < > 12.6 10.7* 13.2  HCT 42.8  < > 39.8 34.9* 42.6  MCV 90.9  < > 93.2 95.1 96.8  PLT 335  < > 330 325 265  < > = values in this interval not displayed.02/13/13:  Glucose 105, BUN 13, cr 0.99, Na 147, K 3.9, Cl 110; wbc 6.4, h/h 11.5/36.1, plts 259  Assessment/Plan 1. Dementia without behavioral disturbance -I suspect her dementia is progressing and this is the reason for her increased difficulty using her utensils -I discussed this with her daughter and educated her on the progression of dementia  2. Bipolar 1 disorder, depressed -has been stable lately with current regimen which hasn't been changed  3. Diabetes insipidus -cont DDAVP  4. Hypothyroidism, unspecified hypothyroidism type -cont current synthroid  Family/ staff Communication: met with her daughter [name] and discussed with  nursing staff  Goals of care: long term care resident, remains full code--this keeps changing

## 2013-05-07 ENCOUNTER — Emergency Department (HOSPITAL_COMMUNITY)
Admission: EM | Admit: 2013-05-07 | Discharge: 2013-05-07 | Disposition: A | Discharge status: Home / Self Care | Payer: Medicare Other | Attending: Emergency Medicine | Admitting: Emergency Medicine

## 2013-05-07 ENCOUNTER — Encounter (HOSPITAL_COMMUNITY): Payer: Self-pay | Admitting: Emergency Medicine

## 2013-05-07 ENCOUNTER — Emergency Department (HOSPITAL_COMMUNITY): Payer: Medicare Other

## 2013-05-07 DIAGNOSIS — Z791 Long term (current) use of non-steroidal anti-inflammatories (NSAID): Secondary | ICD-10-CM | POA: Insufficient documentation

## 2013-05-07 DIAGNOSIS — F411 Generalized anxiety disorder: Secondary | ICD-10-CM | POA: Insufficient documentation

## 2013-05-07 DIAGNOSIS — N390 Urinary tract infection, site not specified: Secondary | ICD-10-CM | POA: Insufficient documentation

## 2013-05-07 DIAGNOSIS — Z872 Personal history of diseases of the skin and subcutaneous tissue: Secondary | ICD-10-CM | POA: Insufficient documentation

## 2013-05-07 DIAGNOSIS — F0280 Dementia in other diseases classified elsewhere without behavioral disturbance: Secondary | ICD-10-CM | POA: Insufficient documentation

## 2013-05-07 DIAGNOSIS — E860 Dehydration: Secondary | ICD-10-CM | POA: Insufficient documentation

## 2013-05-07 DIAGNOSIS — G309 Alzheimer's disease, unspecified: Secondary | ICD-10-CM | POA: Insufficient documentation

## 2013-05-07 DIAGNOSIS — F0281 Dementia in other diseases classified elsewhere with behavioral disturbance: Secondary | ICD-10-CM | POA: Insufficient documentation

## 2013-05-07 DIAGNOSIS — E039 Hypothyroidism, unspecified: Secondary | ICD-10-CM | POA: Insufficient documentation

## 2013-05-07 DIAGNOSIS — F02818 Dementia in other diseases classified elsewhere, unspecified severity, with other behavioral disturbance: Secondary | ICD-10-CM | POA: Insufficient documentation

## 2013-05-07 DIAGNOSIS — F319 Bipolar disorder, unspecified: Secondary | ICD-10-CM | POA: Insufficient documentation

## 2013-05-07 DIAGNOSIS — E119 Type 2 diabetes mellitus without complications: Secondary | ICD-10-CM | POA: Insufficient documentation

## 2013-05-07 DIAGNOSIS — Z880 Allergy status to penicillin: Secondary | ICD-10-CM | POA: Insufficient documentation

## 2013-05-07 DIAGNOSIS — Z85850 Personal history of malignant neoplasm of thyroid: Secondary | ICD-10-CM | POA: Insufficient documentation

## 2013-05-07 DIAGNOSIS — Z8701 Personal history of pneumonia (recurrent): Secondary | ICD-10-CM | POA: Insufficient documentation

## 2013-05-07 DIAGNOSIS — M1990 Unspecified osteoarthritis, unspecified site: Secondary | ICD-10-CM | POA: Insufficient documentation

## 2013-05-07 DIAGNOSIS — E559 Vitamin D deficiency, unspecified: Secondary | ICD-10-CM | POA: Insufficient documentation

## 2013-05-07 DIAGNOSIS — I10 Essential (primary) hypertension: Secondary | ICD-10-CM | POA: Insufficient documentation

## 2013-05-07 DIAGNOSIS — Z79899 Other long term (current) drug therapy: Secondary | ICD-10-CM | POA: Insufficient documentation

## 2013-05-07 LAB — CBC WITH DIFFERENTIAL/PLATELET
BASOS ABS: 0 10*3/uL (ref 0.0–0.1)
BASOS PCT: 0 % (ref 0–1)
EOS ABS: 0.2 10*3/uL (ref 0.0–0.7)
EOS PCT: 3 % (ref 0–5)
HEMATOCRIT: 42.6 % (ref 36.0–46.0)
Hemoglobin: 13.2 g/dL (ref 12.0–15.0)
Lymphocytes Relative: 18 % (ref 12–46)
Lymphs Abs: 1.3 10*3/uL (ref 0.7–4.0)
MCH: 30 pg (ref 26.0–34.0)
MCHC: 31 g/dL (ref 30.0–36.0)
MCV: 96.8 fL (ref 78.0–100.0)
MONO ABS: 0.7 10*3/uL (ref 0.1–1.0)
Monocytes Relative: 9 % (ref 3–12)
Neutro Abs: 5 10*3/uL (ref 1.7–7.7)
Neutrophils Relative %: 70 % (ref 43–77)
PLATELETS: 265 10*3/uL (ref 150–400)
RBC: 4.4 MIL/uL (ref 3.87–5.11)
RDW: 14.9 % (ref 11.5–15.5)
WBC: 7.2 10*3/uL (ref 4.0–10.5)

## 2013-05-07 LAB — URINALYSIS, ROUTINE W REFLEX MICROSCOPIC
Bilirubin Urine: NEGATIVE
GLUCOSE, UA: NEGATIVE mg/dL
Hgb urine dipstick: NEGATIVE
KETONES UR: NEGATIVE mg/dL
NITRITE: NEGATIVE
PH: 7.5 (ref 5.0–8.0)
Protein, ur: NEGATIVE mg/dL
Specific Gravity, Urine: 1.008 (ref 1.005–1.030)
Urobilinogen, UA: 1 mg/dL (ref 0.0–1.0)

## 2013-05-07 LAB — COMPREHENSIVE METABOLIC PANEL
ALBUMIN: 3.4 g/dL — AB (ref 3.5–5.2)
ALT: 9 U/L (ref 0–35)
AST: 29 U/L (ref 0–37)
Alkaline Phosphatase: 103 U/L (ref 39–117)
BUN: 18 mg/dL (ref 6–23)
CALCIUM: 9.7 mg/dL (ref 8.4–10.5)
CO2: 25 mEq/L (ref 19–32)
CREATININE: 1.07 mg/dL (ref 0.50–1.10)
Chloride: 109 mEq/L (ref 96–112)
GFR calc Af Amer: 57 mL/min — ABNORMAL LOW (ref >90)
GFR calc non Af Amer: 49 mL/min — ABNORMAL LOW (ref >90)
Glucose, Bld: 92 mg/dL (ref 70–99)
Potassium: 4.8 mEq/L (ref 3.7–5.3)
SODIUM: 149 meq/L — AB (ref 137–147)
TOTAL PROTEIN: 7.2 g/dL (ref 6.0–8.3)
Total Bilirubin: 0.3 mg/dL (ref 0.3–1.2)

## 2013-05-07 LAB — URINE MICROSCOPIC-ADD ON

## 2013-05-07 LAB — I-STAT CG4 LACTIC ACID, ED: Lactic Acid, Venous: 1.89 mmol/L (ref 0.5–2.2)

## 2013-05-07 LAB — PRO B NATRIURETIC PEPTIDE: Pro B Natriuretic peptide (BNP): 118.8 pg/mL (ref 0–450)

## 2013-05-07 LAB — TROPONIN I: Troponin I: <0.3 ng/mL (ref <0.30)

## 2013-05-07 MED ORDER — SODIUM CHLORIDE 0.9 % IV BOLUS (SEPSIS)
1000.0000 mL | Freq: Once | INTRAVENOUS | Status: AC
  Administered 2013-05-07: 1000 mL via INTRAVENOUS

## 2013-05-07 MED ORDER — CEFUROXIME AXETIL 250 MG PO TABS: 250.0000 mg | ORAL_TABLET | Freq: Two times a day (BID) | ORAL | Status: DC

## 2013-05-07 NOTE — Progress Notes (Signed)
CSW attempted to speak with the patient unsuccessfully because was unresponsive.  The nurse came in to assist with waking the patient and that was unsuccessful.  The nursing staff reports he will present this information back to the doctor for review.     Chesley Noon, MSW, [place], 05/07/2013 Evening Clinical Social Worker [phone]

## 2013-05-07 NOTE — ED Provider Notes (Signed)
CSN: [id number]     Arrival date & time 05/07/13  1238 History   First MD Initiated Contact with Patient 05/07/13 1306     Chief Complaint  Patient presents with  . Altered Mental Status     (Consider location/radiation/quality/duration/timing/severity/associated sxs/prior Treatment) HPI Comments: Patient sent to the ER for evaluation from the nursing home. The patient reportedly has altered mental status. Patient has a history of dementia, but was apparently exhibiting decreased alertness compared to previous. This is sent by EMS. Information provided by anesthesia staff. The patient is confused on arrival, cannot provide any further information. Level V Caveat due to dementia.  Patient is a 79 y.o. female presenting with altered mental status.  Altered Mental Status   Past Medical History  Diagnosis Date  . Thyroid cancer   . Ulcer     chronic  . Depression   . Vitamin D deficiency   . Anxiety   . Malaise and fatigue   . Osteoarthritis   . Kidney disease   . Pneumonia   . Dementia   . Alzheimer disease   . Hypothyroidism   . Bipolar 1 disorder   . Dehydration   . Hypertension   . Diabetes mellitus    Past Surgical History  Procedure Laterality Date  . Esophagogastroduodenoscopy  02/23/2011    Procedure: ESOPHAGOGASTRODUODENOSCOPY (EGD);  Surgeon: Beryle Beams, MD;  Location: [facility] ENDOSCOPY;  Service: Endoscopy;  Laterality: N/A;  . Abdominal hysterectomy    . Foot surgery    . Retinal detachment surgery    . Thyroidectomy     No family history on file. History  Substance Use Topics  . Smoking status: Never Smoker   . Smokeless tobacco: Not on file  . Alcohol Use: No   OB History   Grav Para Term Preterm Abortions TAB SAB Ect Mult Living                 Review of Systems  Unable to perform ROS: Dementia      Allergies  Lithium; Penicillins; and Sulfa antibiotics  Home Medications   Prior to Admission medications   Medication Sig Start Date End Date  Taking? Authorizing Provider  atorvastatin (LIPITOR) 10 MG tablet Take 10 mg by mouth at bedtime.    Yes Historical Provider, MD  bimatoprost (LUMIGAN) 0.03 % ophthalmic solution Place 1 drop into both eyes at bedtime.   Yes Historical Provider, MD  calcium-vitamin D (OSCAL WITH D) 500-200 MG-UNIT per tablet Take 2 tablets by mouth daily.    Yes Historical Provider, MD  cholecalciferol (VITAMIN D) 1000 UNITS tablet Take 1,000 Units by mouth daily.   Yes Historical Provider, MD  desmopressin (DDAVP) 0.2 MG tablet Take 0.2-0.4 mg by mouth 3 (three) times daily. Takes 2 tablets at 10 am 1 tablet at 2 pm and 2 tablets at 10 pm   Yes Historical Provider, MD  diclofenac sodium (VOLTAREN) 1 % GEL Apply 1 application topically 4 (four) times daily. Apply 4 grams to left shoulder and left knee    Yes Historical Provider, MD  lansoprazole (PREVACID) 30 MG capsule Take 30 mg by mouth 2 (two) times daily.   Yes Historical Provider, MD  levothyroxine (SYNTHROID, LEVOTHROID) 125 MCG tablet Take 125 mcg by mouth daily before breakfast.   Yes Historical Provider, MD  Multiple Vitamin (MULTIVITAMIN) tablet Take 1 tablet by mouth daily.     Yes Historical Provider, MD  Nutritional Supplements (TWOCAL HN) LIQD Take 120 mLs by mouth  3 (three) times daily.   Yes Historical Provider, MD  Paliperidone (INVEGA) 1.5 MG TB24 Take 1 tablet by mouth daily.   Yes Historical Provider, MD  PARoxetine (PAXIL) 20 MG tablet Take 20 mg by mouth every morning.   Yes Historical Provider, MD  polyethylene glycol (MIRALAX / GLYCOLAX) packet Take 17 g by mouth 2 (two) times daily.   Yes Historical Provider, MD  sennosides-docusate sodium (SENOKOT-S) 8.6-50 MG tablet Take 2 tablets by mouth at bedtime.   Yes Historical Provider, MD  traZODone (DESYREL) 50 MG tablet Take 25 mg by mouth at bedtime.    Yes Historical Provider, MD  hydrOXYzine (ATARAX/VISTARIL) 25 MG tablet Take 10 mg by mouth every 12 (twelve) hours as needed. anxiety     Historical Provider, MD   BP 159/77  Pulse 81  Temp(Src) 97.8 F (36.6 C) (Oral)  Resp 15  SpO2 93% Physical Exam  Constitutional: She is oriented to person, place, and time. She appears well-developed and well-nourished. No distress.  HENT:  Head: Normocephalic and atraumatic.  Right Ear: Hearing normal.  Left Ear: Hearing normal.  Nose: Nose normal.  Mouth/Throat: Oropharynx is clear and moist. Mucous membranes are dry.  Eyes: Conjunctivae and EOM are normal. Pupils are equal, round, and reactive to light.  Neck: Normal range of motion. Neck supple.  Cardiovascular: Regular rhythm, S1 normal and S2 normal.  Exam reveals no gallop and no friction rub.   No murmur heard. Pulmonary/Chest: Effort normal and breath sounds normal. No respiratory distress. She exhibits no tenderness.  Abdominal: Soft. Normal appearance and bowel sounds are normal. There is no hepatosplenomegaly. There is no tenderness. There is no rebound, no guarding, no tenderness at McBurney's point and negative Murphy's sign. No hernia.  Musculoskeletal: Normal range of motion.  Neurological: She is alert and oriented to person, place, and time. She has normal strength. No cranial nerve deficit or sensory deficit. Coordination normal. GCS eye subscore is 4. GCS verbal subscore is 5. GCS motor subscore is 6.  Skin: Skin is warm, dry and intact. No rash noted. No cyanosis.  Psychiatric: She has a normal mood and affect. Her speech is normal and behavior is normal. Thought content normal.    ED Course  Procedures (including critical care time) Labs Review Labs Reviewed  COMPREHENSIVE METABOLIC PANEL - Abnormal; Notable for the following:    Sodium 149 (*)    Albumin 3.4 (*)    GFR calc non Af Amer 49 (*)    GFR calc Af Amer 57 (*)    All other components within normal limits  URINALYSIS, ROUTINE W REFLEX MICROSCOPIC - Abnormal; Notable for the following:    APPearance CLOUDY (*)    Leukocytes, UA SMALL (*)    All  other components within normal limits  URINE MICROSCOPIC-ADD ON - Abnormal; Notable for the following:    Bacteria, UA MANY (*)    All other components within normal limits  CBC WITH DIFFERENTIAL  PRO B NATRIURETIC PEPTIDE  TROPONIN I  I-STAT CG4 LACTIC ACID, ED    Imaging Review Ct Head Wo Contrast  05/07/2013   CLINICAL DATA:  Unresponsive.  Altered mental status.  Dementia.  EXAM: CT HEAD WITHOUT CONTRAST  TECHNIQUE: Contiguous axial images were obtained from the base of the skull through the vertex without contrast.  COMPARISON:  07/11/2012.  FINDINGS: Generalized atrophy. Chronic microvascular ischemic change. No evidence for acute infarction, hemorrhage, mass lesion, hydrocephalus, or extra-axial fluid. Calvarium intact. Vascular calcification. No sinus  or mastoid fluid of an acute nature.  IMPRESSION: Atrophy and small vessel disease. Similar appearance to priors. No acute intracranial findings.   Electronically Signed   By: Rolla Flatten M.D.   On: 05/07/2013 14:54   Dg Chest Port 1 View  05/07/2013   CLINICAL DATA:  Mental status changes.  EXAM: PORTABLE CHEST - 1 VIEW  COMPARISON:  01/04/2013  FINDINGS: Single view of the chest was obtained. Lungs are clear without airspace disease or edema. Again noted are surgical clips in the lower neck. Heart size is stable and within normal limits. Elevation of the humeral heads suggest rotator cuff disease. In addition, there are degenerative changes at the glenohumeral joints bilaterally. Negative for a pneumothorax.  IMPRESSION: No acute cardiopulmonary disease.   Electronically Signed   By: Markus Daft M.D.   On: 05/07/2013 14:10     EKG Interpretation   Date/Time:  Thursday May 07 2013 13:49:30 EDT Ventricular Rate:  66 PR Interval:  135 QRS Duration: 83 QT Interval:  425 QTC Calculation: 445 R Axis:   -76 Text Interpretation:  Sinus rhythm Left anterior fascicular block Low  voltage, precordial leads Probable anteroseptal infarct,  old Nonspecific T  abnormalities, lateral leads No significant change since last tracing  Confirmed by Ottilie Wigglesworth  MD, [place] [phone]) on 05/07/2013 1:58:10 PM      MDM   Final diagnoses:  Dehydration  UTI (lower urinary tract infection)   Patient presents to the ER at the request of the nursing home staff. Patient has a history of altered mental status secondary to dementia. The patient's physician apparently feels that the patient is dehydrated, requesting IV fluids. I do agree that this appears to be very dry. She was difficult to arouse arrival, but has now become awake and alert. She is confused to place and time, but otherwise appears well. Labs are unremarkable other than a slight hypernatremia. She likely has some urinary tract infection which can be managed with antibiotics. Patient hydrated and sent back to the nursing home.   Orpah Greek, MD 05/07/13 [id number]

## 2013-05-07 NOTE — ED Notes (Signed)
Pt was dressed in the clothes she came in. Pt was also cleaned and had a new brief put on.

## 2013-05-07 NOTE — ED Notes (Signed)
I tried to help the Pt get dressed into the clothes she came in and she refused multiple times.  Nurse notified.   Will ask another tech to make an attempt with me.

## 2013-05-07 NOTE — ED Notes (Signed)
Per EMS: Pt is from [facility]. Per EMS: the facility called EMS due to altered mental status, however staff were unable to advise how the patient is altered. Pt has a history of dementia, fatigue, bipolar, major depressive disorder, and being catatonic. Pt has an order in chart on 05-07-13, "Send to E.R. For labs and 2 liters of fluids and return to facility per Hollace Kinnier, MD."

## 2013-05-07 NOTE — Discharge Instructions (Signed)
Dehydration, Adult °Dehydration is when you lose more fluids from the body than you take in. Vital organs like the kidneys, brain, and heart cannot function without a proper amount of fluids and salt. Any loss of fluids from the body can cause dehydration.  °CAUSES  °· Vomiting. °· Diarrhea. °· Excessive sweating. °· Excessive urine output. °· Fever. °SYMPTOMS  °Mild dehydration °· Thirst. °· Dry lips. °· Slightly dry mouth. °Moderate dehydration °· Very dry mouth. °· Sunken eyes. °· Skin does not bounce back quickly when lightly pinched and released. °· Dark urine and decreased urine production. °· Decreased tear production. °· Headache. °Severe dehydration °· Very dry mouth. °· Extreme thirst. °· Rapid, weak pulse (more than 100 beats per minute at rest). °· Cold hands and feet. °· Not able to sweat in spite of heat and temperature. °· Rapid breathing. °· Blue lips. °· Confusion and lethargy. °· Difficulty being awakened. °· Minimal urine production. °· No tears. °DIAGNOSIS  °Your caregiver will diagnose dehydration based on your symptoms and your exam. Blood and urine tests will help confirm the diagnosis. The diagnostic evaluation should also identify the cause of dehydration. °TREATMENT  °Treatment of mild or moderate dehydration can often be done at home by increasing the amount of fluids that you drink. It is best to drink small amounts of fluid more often. Drinking too much at one time can make vomiting worse. Refer to the home care instructions below. °Severe dehydration needs to be treated at the hospital where you will probably be given intravenous (IV) fluids that contain water and electrolytes. °HOME CARE INSTRUCTIONS  °· Ask your caregiver about specific rehydration instructions. °· Drink enough fluids to keep your urine clear or pale yellow. °· Drink small amounts frequently if you have nausea and vomiting. °· Eat as you normally do. °· Avoid: °· Foods or drinks high in sugar. °· Carbonated  drinks. °· Juice. °· Extremely hot or cold fluids. °· Drinks with caffeine. °· Fatty, greasy foods. °· Alcohol. °· Tobacco. °· Overeating. °· Gelatin desserts. °· Wash your hands well to avoid spreading bacteria and viruses. °· Only take over-the-counter or prescription medicines for pain, discomfort, or fever as directed by your caregiver. °· Ask your caregiver if you should continue all prescribed and over-the-counter medicines. °· Keep all follow-up appointments with your caregiver. °SEEK MEDICAL CARE IF: °· You have abdominal pain and it increases or stays in one area (localizes). °· You have a rash, stiff neck, or severe headache. °· You are irritable, sleepy, or difficult to awaken. °· You are weak, dizzy, or extremely thirsty. °SEEK IMMEDIATE MEDICAL CARE IF:  °· You are unable to keep fluids down or you get worse despite treatment. °· You have frequent episodes of vomiting or diarrhea. °· You have blood or green matter (bile) in your vomit. °· You have blood in your stool or your stool looks black and tarry. °· You have not urinated in 6 to 8 hours, or you have only urinated a small amount of very dark urine. °· You have a fever. °· You faint. °MAKE SURE YOU:  °· Understand these instructions. °· Will watch your condition. °· Will get help right away if you are not doing well or get worse. °Document Released: 12/25/2004 Document Revised: 03/19/2011 Document Reviewed: 08/14/2010 °ExitCare® Patient Information ©2014 [employer]. °Urinary Tract Infection °Urinary tract infections (UTIs) can develop anywhere along your urinary tract. Your urinary tract is your body's drainage system for removing wastes and extra water.   Your urinary tract includes two kidneys, two ureters, a bladder, and a urethra. Your kidneys are a pair of bean-shaped organs. Each kidney is about the size of your fist. They are located below your ribs, one on each side of your spine. °CAUSES °Infections are caused by microbes, which are  microscopic organisms, including fungi, viruses, and bacteria. These organisms are so small that they can only be seen through a microscope. Bacteria are the microbes that most commonly cause UTIs. °SYMPTOMS  °Symptoms of UTIs may vary by age and gender of the patient and by the location of the infection. Symptoms in young women typically include a frequent and intense urge to urinate and a painful, burning feeling in the bladder or urethra during urination. Older women and men are more likely to be tired, shaky, and weak and have muscle aches and abdominal pain. A fever may mean the infection is in your kidneys. Other symptoms of a kidney infection include pain in your back or sides below the ribs, nausea, and vomiting. °DIAGNOSIS °To diagnose a UTI, your caregiver will ask you about your symptoms. Your caregiver also will ask to provide a urine sample. The urine sample will be tested for bacteria and white blood cells. White blood cells are made by your body to help fight infection. °TREATMENT  °Typically, UTIs can be treated with medication. Because most UTIs are caused by a bacterial infection, they usually can be treated with the use of antibiotics. The choice of antibiotic and length of treatment depend on your symptoms and the type of bacteria causing your infection. °HOME CARE INSTRUCTIONS °· If you were prescribed antibiotics, take them exactly as your caregiver instructs you. Finish the medication even if you feel better after you have only taken some of the medication. °· Drink enough water and fluids to keep your urine clear or pale yellow. °· Avoid caffeine, tea, and carbonated beverages. They tend to irritate your bladder. °· Empty your bladder often. Avoid holding urine for long periods of time. °· Empty your bladder before and after sexual intercourse. °· After a bowel movement, women should cleanse from front to back. Use each tissue only once. °SEEK MEDICAL CARE IF:  °· You have back pain. °· You  develop a fever. °· Your symptoms do not begin to resolve within 3 days. °SEEK IMMEDIATE MEDICAL CARE IF:  °· You have severe back pain or lower abdominal pain. °· You develop chills. °· You have nausea or vomiting. °· You have continued burning or discomfort with urination. °MAKE SURE YOU:  °· Understand these instructions. °· Will watch your condition. °· Will get help right away if you are not doing well or get worse. °Document Released: 10/04/2004 Document Revised: 06/26/2011 Document Reviewed: 02/02/2011 °ExitCare® Patient Information ©2014 [employer]. ° °

## 2013-06-12 ENCOUNTER — Encounter: Payer: Self-pay | Admitting: Internal Medicine

## 2013-06-24 ENCOUNTER — Encounter: Payer: Self-pay | Admitting: Internal Medicine

## 2013-06-24 ENCOUNTER — Non-Acute Institutional Stay (SKILLED_NURSING_FACILITY): Payer: Medicare Other | Admitting: Internal Medicine

## 2013-06-24 DIAGNOSIS — E039 Hypothyroidism, unspecified: Secondary | ICD-10-CM

## 2013-06-24 DIAGNOSIS — F0390 Unspecified dementia without behavioral disturbance: Secondary | ICD-10-CM

## 2013-06-24 DIAGNOSIS — E440 Moderate protein-calorie malnutrition: Secondary | ICD-10-CM

## 2013-06-24 DIAGNOSIS — F3130 Bipolar disorder, current episode depressed, mild or moderate severity, unspecified: Secondary | ICD-10-CM

## 2013-06-24 DIAGNOSIS — F319 Bipolar disorder, unspecified: Secondary | ICD-10-CM

## 2013-06-24 DIAGNOSIS — I10 Essential (primary) hypertension: Secondary | ICD-10-CM

## 2013-06-24 DIAGNOSIS — E232 Diabetes insipidus: Secondary | ICD-10-CM

## 2013-06-24 NOTE — Progress Notes (Signed)
Patient ID: [name], female   DOB: February 11, 1934, 79 y.o.   MRN: [id number]  Location:  [facility] Provider:  Rexene Edison. Mariea Clonts, D.O., C.M.D.  Code Status:  Full code  Chief Complaint  Patient presents with  . Medical Management of Chronic Issues    HPI:  79 yo white female with bipolar, Alzheimer's, left foot drop, anemia, failure to thrive and overall decline from her dementia seen for routine visit.  She is having more difficulty swallowing her pills even when crushed.  Review of Systems:  Review of Systems  Constitutional: Positive for weight loss and malaise/fatigue. Negative for fever.  Respiratory: Negative for shortness of breath.   Cardiovascular: Negative for chest pain.  Gastrointestinal: Negative for constipation.  Genitourinary: Negative for dysuria.  Musculoskeletal: Negative for myalgias and falls.  Skin: Negative for rash.  Neurological: Positive for weakness. Negative for dizziness and headaches.  Endo/Heme/Allergies: Bruises/bleeds easily.  Psychiatric/Behavioral: Positive for memory loss. Negative for depression.       Now denies depression    Medications: Patient's Medications  New Prescriptions   No medications on file  Previous Medications   ATORVASTATIN (LIPITOR) 10 MG TABLET    Take 10 mg by mouth at bedtime.    BIMATOPROST (LUMIGAN) 0.03 % OPHTHALMIC SOLUTION    Place 1 drop into both eyes at bedtime.   CALCIUM-VITAMIN D (OSCAL WITH D) 500-200 MG-UNIT PER TABLET    Take 2 tablets by mouth daily.    CHOLECALCIFEROL (VITAMIN D) 1000 UNITS TABLET    Take 1,000 Units by mouth daily.   DESMOPRESSIN (DDAVP) 0.2 MG TABLET    1 by mouth one time a day, 2 by mouth two times a day (Nursing Home Instructions)   DICLOFENAC SODIUM (VOLTAREN) 1 % GEL    Apply 1 application topically 4 (four) times daily. Apply 4 grams to left shoulder and left knee    HYDROXYZINE (ATARAX/VISTARIL) 10 MG TABLET    Take 10 mg by mouth every 12 (twelve) hours. For  Anxiety   LANSOPRAZOLE (PREVACID) 30 MG CAPSULE    Take 30 mg by mouth 2 (two) times daily.   LEVOTHYROXINE (SYNTHROID, LEVOTHROID) 125 MCG TABLET    Take 125 mcg by mouth daily before breakfast.   MULTIPLE VITAMIN (MULTIVITAMIN) TABLET    Take 1 tablet by mouth daily.     PALIPERIDONE (INVEGA) 1.5 MG TB24    Take 1 tablet by mouth daily.   PAROXETINE (PAXIL) 20 MG TABLET    Take 20 mg by mouth every morning.   POLYETHYLENE GLYCOL (MIRALAX / GLYCOLAX) PACKET    Take 17 g by mouth 2 (two) times daily.   SENNOSIDES-DOCUSATE SODIUM (SENOKOT-S) 8.6-50 MG TABLET    Take 2 tablets by mouth at bedtime.   TRAZODONE (DESYREL) 50 MG TABLET    Take 25 mg by mouth at bedtime.   Modified Medications   No medications on file  Discontinued Medications   No medications on file    Physical Exam: Filed Vitals:   06/24/13 1301  BP: 104/74  Pulse: 83  Temp: 98.1 F (36.7 C)  Resp: 17  Height: 5\' 4"  (1.626 m)  Weight: 148 lb (67.132 kg)  SpO2: 96%  Physical Exam  Constitutional: No distress.  Frail, chronically ill appearing white female, is smiling and talking today when asked questions  Cardiovascular: Normal rate, regular rhythm, normal heart sounds and intact distal pulses.   Pulmonary/Chest: Effort normal and breath sounds normal. No respiratory distress.  Abdominal:  Soft. Bowel sounds are normal. She exhibits no distension and no mass. There is no tenderness.  Musculoskeletal:  Brace on left ankle  Neurological: She is alert.  Skin: Skin is warm and dry. There is pallor.  Psychiatric:  Flat affect mostly, did smile a little when spoken to    Labs reviewed: Basic Metabolic Panel:  Recent Labs  01/05/13 0521  01/08/13 0752 01/09/13 0525 05/07/13 1300  NA 143  < > 144 141 149*  K 3.5  < > 4.4 2.9* 4.8  CL 108  < > 109 106 109  CO2 27  < > 22 23 25   GLUCOSE 90  < > 104* 111* 92  BUN 19  < > 11 8 18   CREATININE 1.24*  < > 0.93 0.89 1.07  CALCIUM 8.6  < > 8.7 8.7 9.7  MG 2.3  --    --  1.7  --   < > = values in this interval not displayed.  Liver Function Tests:  Recent Labs  07/21/12 1210 01/04/13 1236 05/07/13 1300  AST 23 15 29   ALT 15 7 9   ALKPHOS 94 89 103  BILITOT 0.4 0.5 0.3  PROT 8.1 7.7 7.2  ALBUMIN 3.7 3.2* 3.4*    CBC:  Recent Labs  01/04/13 1236  01/06/13 0523 01/07/13 0537 05/07/13 1300  WBC 8.0  < > 5.7 6.0 7.2  NEUTROABS 6.6  --   --   --  5.0  HGB 13.8  < > 12.6 10.7* 13.2  HCT 42.8  < > 39.8 34.9* 42.6  MCV 90.9  < > 93.2 95.1 96.8  PLT 335  < > 330 325 265  < > = values in this interval not displayed.   Assessment/Plan 1. Dementia without behavioral disturbance -felt to be Alzheimer's type -is progressing--more difficulty with swallowing and less and less interactive with environment, intake gradually declining -keep discussing more palliative approaches with her daughter [name], but she is having difficulty accepting her mom's decline  2. Bipolar 1 disorder, depressed -cont invega per psychiatry outpatient with whom she follows and trazodone at hs for sleep  3. Diabetes insipidus -cont ddavp -f/u bmp  4. Hypothyroidism, unspecified hypothyroidism type -cont synthroid 134mcg daily, f/u tsh  5. Moderate protein-calorie malnutrition -cont supplements, assistance with meals, crushing pills   6. Essential hypertension, benign -bp at goal without meds at present  Family/ staff Communication:discussed with [name] and nurse Goals of care: remains full code  Labs/tests ordered:  Cbc, bmp, tsh

## 2013-08-26 ENCOUNTER — Non-Acute Institutional Stay (SKILLED_NURSING_FACILITY): Payer: Medicare Other | Admitting: Internal Medicine

## 2013-08-26 ENCOUNTER — Encounter: Payer: Self-pay | Admitting: Internal Medicine

## 2013-08-26 DIAGNOSIS — F319 Bipolar disorder, unspecified: Secondary | ICD-10-CM

## 2013-08-26 DIAGNOSIS — F0280 Dementia in other diseases classified elsewhere without behavioral disturbance: Secondary | ICD-10-CM

## 2013-08-26 DIAGNOSIS — F0390 Unspecified dementia without behavioral disturbance: Secondary | ICD-10-CM

## 2013-08-26 DIAGNOSIS — K5900 Constipation, unspecified: Secondary | ICD-10-CM

## 2013-08-26 DIAGNOSIS — E232 Diabetes insipidus: Secondary | ICD-10-CM

## 2013-08-26 DIAGNOSIS — K5909 Other constipation: Secondary | ICD-10-CM

## 2013-08-26 DIAGNOSIS — G309 Alzheimer's disease, unspecified: Secondary | ICD-10-CM

## 2013-08-26 DIAGNOSIS — K219 Gastro-esophageal reflux disease without esophagitis: Secondary | ICD-10-CM

## 2013-08-26 DIAGNOSIS — E039 Hypothyroidism, unspecified: Secondary | ICD-10-CM

## 2013-08-26 DIAGNOSIS — M1990 Unspecified osteoarthritis, unspecified site: Secondary | ICD-10-CM

## 2013-08-26 DIAGNOSIS — F3130 Bipolar disorder, current episode depressed, mild or moderate severity, unspecified: Secondary | ICD-10-CM

## 2013-08-26 NOTE — Progress Notes (Signed)
Patient ID: [name], female   DOB: 03/30/1934, 79 y.o.   MRN: [id number]  Location:  [facility] Provider:  Rexene Edison. Mariea Clonts, D.O., C.M.D.  Code Status:  Full code   Chief Complaint  Patient presents with  . Medical Management of Chronic Issues    HPI:  79 yo white female with longstanding psychiatric history with bipolar disorder and also AD, CKD, glaucoma, OA, anemia, constipation and diabetes insipidus was seen for medical mgt of her chronic diseases.  She seems much happier the past several weeks when I've seen her in the halls, also, no longer saying how she's depressed when asked anything like she used to.  She had no complaints whatsoever when I saw her (also unusual).  Review of Systems:  Review of Systems  Constitutional: Negative for fever.  HENT: Negative for congestion.   Respiratory: Negative for shortness of breath.   Cardiovascular: Negative for chest pain and leg swelling.  Gastrointestinal: Negative for abdominal pain, constipation, blood in stool and melena.  Genitourinary: Negative for dysuria.  Musculoskeletal: Negative for falls.       Wears brace on foot  Skin: Negative for rash.  Neurological: Negative for dizziness.  Psychiatric/Behavioral: Positive for memory loss. Negative for depression. The patient does not have insomnia.     Medications: Patient's Medications  New Prescriptions   No medications on file  Previous Medications   ATORVASTATIN (LIPITOR) 10 MG TABLET    Take 10 mg by mouth at bedtime.    BIMATOPROST (LUMIGAN) 0.03 % OPHTHALMIC SOLUTION    Place 1 drop into both eyes at bedtime.   CALCIUM-VITAMIN D (OSCAL WITH D) 500-200 MG-UNIT PER TABLET    Take 2 tablets by mouth daily.    CHOLECALCIFEROL (VITAMIN D) 1000 UNITS TABLET    Take 1,000 Units by mouth daily.   DESMOPRESSIN (DDAVP) 0.2 MG TABLET    1 by mouth one time a day, 2 by mouth two times a day (Nursing Home Instructions)   DICLOFENAC SODIUM (VOLTAREN) 1 % GEL     Apply 1 application topically 4 (four) times daily. Apply 4 grams to left shoulder and left knee    HYDROXYZINE (ATARAX/VISTARIL) 10 MG TABLET    Take 10 mg by mouth every 12 (twelve) hours. For Anxiety   LANSOPRAZOLE (PREVACID) 30 MG CAPSULE    Take 30 mg by mouth 2 (two) times daily.   LEVOTHYROXINE (SYNTHROID, LEVOTHROID) 125 MCG TABLET    Take 125 mcg by mouth daily before breakfast.   MULTIPLE VITAMIN (MULTIVITAMIN) TABLET    Take 1 tablet by mouth daily.     PALIPERIDONE (INVEGA) 1.5 MG TB24    Take 1 tablet by mouth daily.   PAROXETINE (PAXIL) 20 MG TABLET    Take 20 mg by mouth every morning.   POLYETHYLENE GLYCOL (MIRALAX / GLYCOLAX) PACKET    Take 17 g by mouth 2 (two) times daily.   SENNOSIDES-DOCUSATE SODIUM (SENOKOT-S) 8.6-50 MG TABLET    Take 2 tablets by mouth at bedtime.   TRAZODONE (DESYREL) 50 MG TABLET    Take 25 mg by mouth at bedtime.   Modified Medications   No medications on file  Discontinued Medications   No medications on file    Physical Exam: Filed Vitals:   08/26/13 1359  BP: 120/64  Pulse: 77  Temp: 97.6 F (36.4 C)  Resp: 18  Height: 5\' 4"  (1.626 m)  Weight: 135 lb (61.236 kg)  SpO2: 94%  Physical Exam  Constitutional: No distress.  Cardiovascular: Normal rate, regular rhythm, normal heart sounds and intact distal pulses.   Pulmonary/Chest: Effort normal and breath sounds normal. No respiratory distress.  Abdominal: Soft. Bowel sounds are normal.  Musculoskeletal: Normal range of motion.  Does wear brace on ankle/foot  Neurological: She is alert.  Oriented to person  Skin: Skin is warm and dry. There is pallor.  Psychiatric: She has a normal mood and affect.    Labs reviewed: Basic Metabolic Panel:  Recent Labs  01/05/13 0521  01/08/13 0752 01/09/13 0525 05/07/13 1300  NA 143  < > 144 141 149*  K 3.5  < > 4.4 2.9* 4.8  CL 108  < > 109 106 109  CO2 27  < > 22 23 25   GLUCOSE 90  < > 104* 111* 92  BUN 19  < > 11 8 18   CREATININE  1.24*  < > 0.93 0.89 1.07  CALCIUM 8.6  < > 8.7 8.7 9.7  MG 2.3  --   --  1.7  --   < > = values in this interval not displayed.  Liver Function Tests:  Recent Labs  01/04/13 1236 05/07/13 1300  AST 15 29  ALT 7 9  ALKPHOS 89 103  BILITOT 0.5 0.3  PROT 7.7 7.2  ALBUMIN 3.2* 3.4*    CBC:  Recent Labs  01/04/13 1236  01/06/13 0523 01/07/13 0537 05/07/13 1300  WBC 8.0  < > 5.7 6.0 7.2  NEUTROABS 6.6  --   --   --  5.0  HGB 13.8  < > 12.6 10.7* 13.2  HCT 42.8  < > 39.8 34.9* 42.6  MCV 90.9  < > 93.2 95.1 96.8  PLT 335  < > 330 325 265  < > = values in this interval not displayed.   Assessment/Plan 1. Diabetes insipidus -cont DDAVP as above -avoid excessive water intake and lithium which have contributed in the past  2. Bipolar 1 disorder, depressed -cont invega and paxil per psychiatry with trazodone at bedtime -seems quite stable on this right now so I wouldn't change a thing  3. Dementia without behavioral disturbance -she is no longer on aricept (was losing weight) and is not on namenda either (benefit would be minimal at this stage of her disease anyway) -due to #4  4. Alzheimer's disease -cont to avoid benzos, other sedatives and maintain good intake and hydration -she is in moderate to late stages and off AD meds  5. Hypothyroidism, unspecified hypothyroidism type -cont current synthroid, last tsh in June  6. Osteoarthritis, unspecified osteoarthritis type, unspecified site -cont voltaren gel for knees   7. Gastroesophageal reflux disease, esophagitis presence not specified -cont prevacid due to symptoms  8. Chronic constipation -cont senna s and facility standing orders if she needs anything else  Family/ staff Communication: discussed with nurse;  Her daughter [name] contacts me if she has concerns  Goals of care: long term resident;  Has previously had more palliative goals, but is back to full code again since her last admission?  Labs/tests  ordered:  None, stable

## 2013-08-29 ENCOUNTER — Encounter: Payer: Self-pay | Admitting: Internal Medicine

## 2013-08-29 DIAGNOSIS — G309 Alzheimer's disease, unspecified: Secondary | ICD-10-CM

## 2013-08-29 DIAGNOSIS — F0280 Dementia in other diseases classified elsewhere without behavioral disturbance: Secondary | ICD-10-CM | POA: Insufficient documentation

## 2013-08-29 DIAGNOSIS — M1990 Unspecified osteoarthritis, unspecified site: Secondary | ICD-10-CM | POA: Insufficient documentation

## 2013-08-29 DIAGNOSIS — E039 Hypothyroidism, unspecified: Secondary | ICD-10-CM | POA: Insufficient documentation

## 2013-08-29 DIAGNOSIS — K219 Gastro-esophageal reflux disease without esophagitis: Secondary | ICD-10-CM | POA: Insufficient documentation

## 2013-08-29 DIAGNOSIS — K5909 Other constipation: Secondary | ICD-10-CM | POA: Insufficient documentation

## 2013-09-23 ENCOUNTER — Non-Acute Institutional Stay (SKILLED_NURSING_FACILITY): Payer: Medicare Other | Admitting: Internal Medicine

## 2013-09-23 DIAGNOSIS — E039 Hypothyroidism, unspecified: Secondary | ICD-10-CM

## 2013-09-23 DIAGNOSIS — G309 Alzheimer's disease, unspecified: Secondary | ICD-10-CM

## 2013-09-23 DIAGNOSIS — E232 Diabetes insipidus: Secondary | ICD-10-CM

## 2013-09-23 DIAGNOSIS — F3130 Bipolar disorder, current episode depressed, mild or moderate severity, unspecified: Secondary | ICD-10-CM

## 2013-09-23 DIAGNOSIS — F0280 Dementia in other diseases classified elsewhere without behavioral disturbance: Secondary | ICD-10-CM

## 2013-09-23 DIAGNOSIS — F0390 Unspecified dementia without behavioral disturbance: Secondary | ICD-10-CM

## 2013-09-23 DIAGNOSIS — F319 Bipolar disorder, unspecified: Secondary | ICD-10-CM

## 2013-09-23 NOTE — Progress Notes (Signed)
Patient ID: [name], female   DOB: October 06, 1934, 79 y.o.   MRN: [id number]  Location:  [place] Provider:  Rexene Edison. Mariea Clonts, D.O., C.M.D.  Code Status:  Full code  Chief Complaint  Patient presents with  . Acute Visit    periods of lethargy, itching herself  . Medical Management of Chronic Issues    HPI:  79 yo female with late stage dementia, bipolar disorder that has been living here long term was seen due to her daughter's concerns that she has been lethargic and itching herself frequently.  Pt is drowsy most of the time for the past several months to a year at all visits despite previously normal labs.  Her intake has been diminishing.  She's had more difficulty swallowing even crushed pills.  Discussed with [name] that we will check labs to see if her kidneys have deteriorated and that's why she's itching or if it's due to dry skin and anxiety.     Review of Systems:  Review of Systems  Unable to perform ROS: dementia    Medications: Patient's Medications  New Prescriptions   ACETAMINOPHEN (TYLENOL) 325 MG TABLET    Take 2 tablets (650 mg total) by mouth every 6 (six) hours as needed for mild pain (or Fever >/= 101).   ANTISEPTIC ORAL RINSE (CPC / CETYLPYRIDINIUM CHLORIDE 0.05%) 0.05 % LIQD SOLUTION    7 mLs by Mouth Rinse route 2 times daily at 12 noon and 4 pm.   BISACODYL (DULCOLAX) 10 MG SUPPOSITORY    Place 1 suppository (10 mg total) rectally daily as needed for moderate constipation.   CHLORHEXIDINE (PERIDEX) 0.12 % SOLUTION    15 mLs by Mouth Rinse route 2 (two) times daily.   FEEDING SUPPLEMENT, ENSURE COMPLETE, (ENSURE COMPLETE) LIQD    Take 237 mLs by mouth 2 (two) times daily between meals.   LEVOTHYROXINE (SYNTHROID, LEVOTHROID) 75 MCG TABLET    Take 1 tablet (75 mcg total) by mouth daily before breakfast.   POLYETHYLENE GLYCOL (MIRALAX / GLYCOLAX) PACKET    Take 17 g by mouth daily.   TRAZODONE (DESYREL) 50 MG TABLET    Take 0.5 tablets (25 mg total)  by mouth at bedtime as needed (insomnia).  Previous Medications   AMINO ACIDS-PROTEIN HYDROLYS (FEEDING SUPPLEMENT, PRO-STAT SUGAR FREE 64,) LIQD    Take 30 mLs by mouth 3 (three) times daily with meals.   CALCIUM-VITAMIN D (OSCAL WITH D) 500-200 MG-UNIT PER TABLET    Take 2 tablets by mouth daily.    CHOLECALCIFEROL (VITAMIN D) 1000 UNITS TABLET    Take 1,000 Units by mouth daily.   DICLOFENAC SODIUM (VOLTAREN) 1 % GEL    Apply 4 g topically 4 (four) times daily. Apply to left shoulder and left knee   HYDROXYZINE (ATARAX/VISTARIL) 10 MG TABLET    Take 10 mg by mouth 2 (two) times daily as needed for anxiety. For Anxiety   LANSOPRAZOLE (PREVACID) 30 MG CAPSULE    Take 30 mg by mouth 2 (two) times daily.   LUMIGAN 0.01 % SOLN    Place 1 drop into both eyes at bedtime.   MULTIPLE VITAMIN (MULTIVITAMIN) TABLET    Take 1 tablet by mouth daily.     PAROXETINE (PAXIL) 20 MG TABLET    Take 20 mg by mouth every morning.   SENNOSIDES-DOCUSATE SODIUM (SENOKOT-S) 8.6-50 MG TABLET    Take 2 tablets by mouth at bedtime.   VITAMIN C (ASCORBIC ACID) 500 MG TABLET  Take 500 mg by mouth daily.  Modified Medications   No medications on file  Discontinued Medications   ATORVASTATIN (LIPITOR) 10 MG TABLET    Take 10 mg by mouth at bedtime.    BIMATOPROST (LUMIGAN) 0.03 % OPHTHALMIC SOLUTION    Place 1 drop into both eyes at bedtime.   DESMOPRESSIN (DDAVP) 0.2 MG TABLET    Take 0.2 mg by mouth daily. For diabetes insipidus   LEVOTHYROXINE (SYNTHROID, LEVOTHROID) 125 MCG TABLET    Take 125 mcg by mouth daily before breakfast.   PALIPERIDONE (INVEGA) 1.5 MG TB24    Take 1 tablet by mouth at bedtime.    POLYETHYLENE GLYCOL (MIRALAX / GLYCOLAX) PACKET    Take 17 g by mouth 2 (two) times daily.   TRAZODONE (DESYREL) 50 MG TABLET    Take 25 mg by mouth at bedtime as needed (insomnia).     Physical Exam: Filed Vitals:   09/22/13 1136  BP: 127/68  Pulse: 72  Temp: 98 F (36.7 C)  Resp: 17  Height: 5\' 4"  (1.626  m)  Weight: 134 lb (60.782 kg)  SpO2: 96%   Physical Exam  Constitutional: No distress.  Lethargic white female, can be aroused and will answer questions, then closes her eyes  Cardiovascular: Normal rate, regular rhythm, normal heart sounds and intact distal pulses.   Pulmonary/Chest: Effort normal and breath sounds normal. No respiratory distress.  Abdominal: Soft. Bowel sounds are normal. She exhibits no distension and no mass. There is no tenderness.  Musculoskeletal: Normal range of motion.  Wearing ankle brace  Skin: Skin is dry. There is pallor.    Labs reviewed: Basic Metabolic Panel:  Recent Labs  11/04/13 0505  12/29/13 2045  01/01/14 1723 01/02/14 0507 01/04/14 0532  NA 151*  < > 166*  < > 152* 144 140  K 3.4*  < > 3.7  < > 4.4 5.3* 3.7  CL 115*  < > >130*  < > 119* 118* 113*  CO2 22  < > 27  < > 22 17* 23  GLUCOSE 171*  < > 153*  < > 208* 134* 94  BUN 29*  < > 49*  < > 25* 21 16  CREATININE 1.17*  < > 1.49*  < > 1.12* 1.24* 0.95  CALCIUM 8.5  < > 8.9  < > 9.2 8.5 8.3*  MG 2.3  --  2.9*  --   --  2.4  --   < > = values in this interval not displayed.  Liver Function Tests:  Recent Labs  11/01/13 1605 12/28/13 2307 01/02/14 0507  AST 15 19 43*  ALT 7 6 10   ALKPHOS 96 102 95  BILITOT 0.5 0.5 2.2*  PROT 7.9 7.1 5.9*  ALBUMIN 3.5 3.3* 2.9*    CBC:  Recent Labs  05/07/13 1300  11/04/13 0700 12/28/13 2023 12/29/13 0410  WBC 7.2  < > 7.7 7.8 7.4  NEUTROABS 5.0  --   --  5.3  --   HGB 13.2  < > 12.7 14.2 14.5  HCT 42.6  < > 42.8 50.4* 53.5*  MCV 96.8  < > 98.2 109.3* 111.2*  PLT 265  < > 137* 126* 142*  < > = values in this interval not displayed.  Assessment/Plan 1. Dementia without behavioral disturbance -late stages with diminished po intake--needs coaxing and sometimes feeding to get her to take po, not drinking on her own either -having more difficulty swallowing pills -[name] has not accepted  the stage of the disease that her mom is at at  this point -will check labs for acute abnormality (Na, cr especially based on history and pruritis) -recommend avoiding fluids and hospitalization and trying hospice care but this has been discussed many times and [name] has not accepted it  2. Alzheimer's disease -late stage, dependent in adls  3. Bipolar 1 disorder, depressed -continues on invega as per psychiatry due to intolerances or ineffectiveness of alternatives  4. Diabetes insipidus -continues on DDAVP for this -f/u bmp  5. Hypothyroidism, unspecified hypothyroidism type -tsh has been stable, cont synthroid 158mcg daily  Family/ staff Communication: discussed with her daughter, [name] and nursing staff  Goals of care: remains full code (this has changed many times, but [name] has not accepted that her mom is at the end stages of her dementia and insists staff are not caring for her properly and that's why she has declined)  Labs/tests ordered:  Cbc, bmp

## 2013-10-11 ENCOUNTER — Encounter: Payer: Self-pay | Admitting: Internal Medicine

## 2013-11-01 ENCOUNTER — Encounter (HOSPITAL_COMMUNITY): Payer: Self-pay | Admitting: Internal Medicine

## 2013-11-01 ENCOUNTER — Emergency Department (HOSPITAL_COMMUNITY): Payer: Medicare Other

## 2013-11-01 ENCOUNTER — Inpatient Hospital Stay (HOSPITAL_COMMUNITY)
Admission: EM | Admit: 2013-11-01 | Discharge: 2013-11-06 | DRG: 640 | Disposition: A | Discharge status: Skilled Nursing Facility | Payer: Medicare Other | Attending: Internal Medicine | Admitting: Internal Medicine

## 2013-11-01 DIAGNOSIS — E440 Moderate protein-calorie malnutrition: Secondary | ICD-10-CM

## 2013-11-01 DIAGNOSIS — E870 Hyperosmolality and hypernatremia: Secondary | ICD-10-CM | POA: Diagnosis not present

## 2013-11-01 DIAGNOSIS — E1149 Type 2 diabetes mellitus with other diabetic neurological complication: Secondary | ICD-10-CM | POA: Diagnosis present

## 2013-11-01 DIAGNOSIS — I1310 Hypertensive heart and chronic kidney disease without heart failure, with stage 1 through stage 4 chronic kidney disease, or unspecified chronic kidney disease: Secondary | ICD-10-CM | POA: Diagnosis present

## 2013-11-01 DIAGNOSIS — E890 Postprocedural hypothyroidism: Secondary | ICD-10-CM | POA: Diagnosis present

## 2013-11-01 DIAGNOSIS — E119 Type 2 diabetes mellitus without complications: Secondary | ICD-10-CM

## 2013-11-01 DIAGNOSIS — IMO0001 Reserved for inherently not codable concepts without codable children: Secondary | ICD-10-CM

## 2013-11-01 DIAGNOSIS — F0280 Dementia in other diseases classified elsewhere without behavioral disturbance: Secondary | ICD-10-CM | POA: Diagnosis present

## 2013-11-01 DIAGNOSIS — D62 Acute posthemorrhagic anemia: Secondary | ICD-10-CM

## 2013-11-01 DIAGNOSIS — E1121 Type 2 diabetes mellitus with diabetic nephropathy: Secondary | ICD-10-CM | POA: Diagnosis present

## 2013-11-01 DIAGNOSIS — R4182 Altered mental status, unspecified: Secondary | ICD-10-CM

## 2013-11-01 DIAGNOSIS — N170 Acute kidney failure with tubular necrosis: Secondary | ICD-10-CM

## 2013-11-01 DIAGNOSIS — F419 Anxiety disorder, unspecified: Secondary | ICD-10-CM | POA: Diagnosis present

## 2013-11-01 DIAGNOSIS — H409 Unspecified glaucoma: Secondary | ICD-10-CM | POA: Diagnosis present

## 2013-11-01 DIAGNOSIS — E860 Dehydration: Secondary | ICD-10-CM | POA: Diagnosis present

## 2013-11-01 DIAGNOSIS — E782 Mixed hyperlipidemia: Secondary | ICD-10-CM | POA: Diagnosis present

## 2013-11-01 DIAGNOSIS — Z85850 Personal history of malignant neoplasm of thyroid: Secondary | ICD-10-CM | POA: Diagnosis not present

## 2013-11-01 DIAGNOSIS — N178 Other acute kidney failure: Secondary | ICD-10-CM

## 2013-11-01 DIAGNOSIS — L89152 Pressure ulcer of sacral region, stage 2: Secondary | ICD-10-CM | POA: Diagnosis present

## 2013-11-01 DIAGNOSIS — Z882 Allergy status to sulfonamides status: Secondary | ICD-10-CM | POA: Diagnosis not present

## 2013-11-01 DIAGNOSIS — D649 Anemia, unspecified: Secondary | ICD-10-CM

## 2013-11-01 DIAGNOSIS — E43 Unspecified severe protein-calorie malnutrition: Secondary | ICD-10-CM

## 2013-11-01 DIAGNOSIS — F3130 Bipolar disorder, current episode depressed, mild or moderate severity, unspecified: Secondary | ICD-10-CM | POA: Diagnosis present

## 2013-11-01 DIAGNOSIS — D638 Anemia in other chronic diseases classified elsewhere: Secondary | ICD-10-CM | POA: Diagnosis present

## 2013-11-01 DIAGNOSIS — F0390 Unspecified dementia without behavioral disturbance: Secondary | ICD-10-CM

## 2013-11-01 DIAGNOSIS — F319 Bipolar disorder, unspecified: Secondary | ICD-10-CM

## 2013-11-01 DIAGNOSIS — E038 Other specified hypothyroidism: Secondary | ICD-10-CM

## 2013-11-01 DIAGNOSIS — Z8639 Personal history of other endocrine, nutritional and metabolic disease: Secondary | ICD-10-CM | POA: Diagnosis not present

## 2013-11-01 DIAGNOSIS — G309 Alzheimer's disease, unspecified: Secondary | ICD-10-CM | POA: Diagnosis present

## 2013-11-01 DIAGNOSIS — K5900 Constipation, unspecified: Secondary | ICD-10-CM | POA: Diagnosis present

## 2013-11-01 DIAGNOSIS — E876 Hypokalemia: Secondary | ICD-10-CM

## 2013-11-01 DIAGNOSIS — N179 Acute kidney failure, unspecified: Secondary | ICD-10-CM | POA: Diagnosis present

## 2013-11-01 DIAGNOSIS — N183 Chronic kidney disease, stage 3 (moderate): Secondary | ICD-10-CM | POA: Diagnosis present

## 2013-11-01 DIAGNOSIS — E039 Hypothyroidism, unspecified: Secondary | ICD-10-CM | POA: Diagnosis present

## 2013-11-01 DIAGNOSIS — Z880 Allergy status to penicillin: Secondary | ICD-10-CM

## 2013-11-01 DIAGNOSIS — Z862 Personal history of diseases of the blood and blood-forming organs and certain disorders involving the immune mechanism: Secondary | ICD-10-CM

## 2013-11-01 DIAGNOSIS — M1990 Unspecified osteoarthritis, unspecified site: Secondary | ICD-10-CM | POA: Diagnosis present

## 2013-11-01 DIAGNOSIS — I10 Essential (primary) hypertension: Secondary | ICD-10-CM | POA: Diagnosis present

## 2013-11-01 DIAGNOSIS — Z993 Dependence on wheelchair: Secondary | ICD-10-CM | POA: Diagnosis not present

## 2013-11-01 DIAGNOSIS — E785 Hyperlipidemia, unspecified: Secondary | ICD-10-CM

## 2013-11-01 DIAGNOSIS — E232 Diabetes insipidus: Secondary | ICD-10-CM | POA: Diagnosis present

## 2013-11-01 DIAGNOSIS — G9341 Metabolic encephalopathy: Secondary | ICD-10-CM

## 2013-11-01 DIAGNOSIS — E1122 Type 2 diabetes mellitus with diabetic chronic kidney disease: Secondary | ICD-10-CM

## 2013-11-01 DIAGNOSIS — K5909 Other constipation: Secondary | ICD-10-CM

## 2013-11-01 DIAGNOSIS — Z6820 Body mass index (BMI) 20.0-20.9, adult: Secondary | ICD-10-CM | POA: Diagnosis not present

## 2013-11-01 DIAGNOSIS — K56609 Unspecified intestinal obstruction, unspecified as to partial versus complete obstruction: Secondary | ICD-10-CM

## 2013-11-01 DIAGNOSIS — K219 Gastro-esophageal reflux disease without esophagitis: Secondary | ICD-10-CM

## 2013-11-01 HISTORY — DX: Unspecified open-angle glaucoma, stage unspecified: H40.10X0

## 2013-11-01 HISTORY — DX: Unspecified osteoarthritis, unspecified site: M19.90

## 2013-11-01 HISTORY — DX: Chronic kidney disease, stage 3 unspecified: N18.30

## 2013-11-01 HISTORY — DX: Polyneuropathy, unspecified: G62.9

## 2013-11-01 HISTORY — DX: Chronic kidney disease, stage 3 (moderate): N18.3

## 2013-11-01 HISTORY — DX: Anemia, unspecified: D64.9

## 2013-11-01 HISTORY — DX: Hypertensive heart disease without heart failure: I11.9

## 2013-11-01 HISTORY — DX: Constipation, unspecified: K59.00

## 2013-11-01 HISTORY — DX: Mixed hyperlipidemia: E78.2

## 2013-11-01 HISTORY — DX: Pressure ulcer of unspecified site, stage 3: L89.93

## 2013-11-01 LAB — URINALYSIS, ROUTINE W REFLEX MICROSCOPIC
Glucose, UA: NEGATIVE mg/dL
HGB URINE DIPSTICK: NEGATIVE
KETONES UR: NEGATIVE mg/dL
Nitrite: NEGATIVE
Protein, ur: 30 mg/dL — AB
SPECIFIC GRAVITY, URINE: 1.022 (ref 1.005–1.030)
UROBILINOGEN UA: 1 mg/dL (ref 0.0–1.0)
pH: 7 (ref 5.0–8.0)

## 2013-11-01 LAB — T4: T4, Total: 8.1 ug/dL (ref 4.5–12.0)

## 2013-11-01 LAB — URINE MICROSCOPIC-ADD ON

## 2013-11-01 LAB — CBC
HCT: 53.3 % — ABNORMAL HIGH (ref 36.0–46.0)
HEMOGLOBIN: 15.5 g/dL — AB (ref 12.0–15.0)
MCH: 30.5 pg (ref 26.0–34.0)
MCHC: 29.1 g/dL — AB (ref 30.0–36.0)
MCV: 104.7 fL — ABNORMAL HIGH (ref 78.0–100.0)
Platelets: 188 10*3/uL (ref 150–400)
RBC: 5.09 MIL/uL (ref 3.87–5.11)
RDW: 15.7 % — AB (ref 11.5–15.5)
WBC: 9.1 10*3/uL (ref 4.0–10.5)

## 2013-11-01 LAB — TSH: TSH: 0.048 u[IU]/mL — AB (ref 0.350–4.500)

## 2013-11-01 LAB — GLUCOSE, CAPILLARY
GLUCOSE-CAPILLARY: 92 mg/dL (ref 70–99)
Glucose-Capillary: 83 mg/dL (ref 70–99)

## 2013-11-01 LAB — PROTIME-INR
INR: 1.12 (ref 0.00–1.49)
Prothrombin Time: 14.5 seconds (ref 11.6–15.2)

## 2013-11-01 LAB — TROPONIN I: Troponin I: <0.3 ng/mL (ref <0.30)

## 2013-11-01 MED ORDER — INSULIN ASPART 100 UNIT/ML ~~LOC~~ SOLN
0.0000 [IU] | SUBCUTANEOUS | Status: DC
  Administered 2013-11-02 (×2): 2 [IU] via SUBCUTANEOUS
  Administered 2013-11-03: 1 [IU] via SUBCUTANEOUS
  Administered 2013-11-04: 2 [IU] via SUBCUTANEOUS
  Administered 2013-11-04 – 2013-11-06 (×10): 1 [IU] via SUBCUTANEOUS

## 2013-11-01 MED ORDER — ENOXAPARIN SODIUM 30 MG/0.3ML ~~LOC~~ SOLN
30.0000 mg | SUBCUTANEOUS | Status: DC
  Administered 2013-11-01 – 2013-11-02 (×2): 30 mg via SUBCUTANEOUS
  Filled 2013-11-01 (×4): qty 0.3

## 2013-11-01 MED ORDER — ACETAMINOPHEN 325 MG PO TABS: 650.0000 mg | ORAL_TABLET | Freq: Four times a day (QID) | ORAL | Status: DC | PRN

## 2013-11-01 MED ORDER — LEVOTHYROXINE SODIUM 100 MCG IV SOLR
62.5000 ug | Freq: Every day | INTRAVENOUS | Status: DC
  Administered 2013-11-02: 62.5 ug via INTRAVENOUS
  Filled 2013-11-01: qty 5

## 2013-11-01 MED ORDER — SODIUM CHLORIDE 0.45 % IV SOLN
INTRAVENOUS | Status: DC
  Administered 2013-11-01 – 2013-11-02 (×2): via INTRAVENOUS

## 2013-11-01 MED ORDER — CHLORHEXIDINE GLUCONATE 0.12 % MT SOLN
15.0000 mL | Freq: Two times a day (BID) | OROMUCOSAL | Status: DC
  Administered 2013-11-02 – 2013-11-06 (×9): 15 mL via OROMUCOSAL
  Filled 2013-11-01 (×12): qty 15

## 2013-11-01 MED ORDER — HYDRALAZINE HCL 20 MG/ML IJ SOLN: 10.0000 mg | Freq: Four times a day (QID) | INTRAMUSCULAR | Status: DC | PRN

## 2013-11-01 MED ORDER — LATANOPROST 0.005 % OP SOLN
1.0000 [drp] | Freq: Every day | OPHTHALMIC | Status: DC
  Administered 2013-11-01 – 2013-11-05 (×5): 1 [drp] via OPHTHALMIC
  Filled 2013-11-01 (×2): qty 2.5

## 2013-11-01 MED ORDER — SODIUM CHLORIDE 0.9 % IV SOLN
1000.0000 mL | Freq: Once | INTRAVENOUS | Status: AC
  Administered 2013-11-01: 1000 mL via INTRAVENOUS

## 2013-11-01 MED ORDER — SODIUM CHLORIDE 0.9 % IV SOLN: 1000.0000 mL | INTRAVENOUS | Status: DC

## 2013-11-01 MED ORDER — CETYLPYRIDINIUM CHLORIDE 0.05 % MT LIQD
7.0000 mL | Freq: Two times a day (BID) | OROMUCOSAL | Status: DC
  Administered 2013-11-02 – 2013-11-06 (×6): 7 mL via OROMUCOSAL

## 2013-11-01 MED ORDER — SODIUM CHLORIDE 0.9 % IV SOLN: INTRAVENOUS | Status: DC

## 2013-11-01 MED ORDER — ACETAMINOPHEN 650 MG RE SUPP: 650.0000 mg | Freq: Four times a day (QID) | RECTAL | Status: DC | PRN

## 2013-11-01 NOTE — ED Notes (Signed)
[place]

## 2013-11-01 NOTE — H&P (Signed)
History and Physical:    [name] ZJI:[id number] DOB: April 13, 1934 DOA: 11/01/2013  Referring physician: Dr. Rolland Porter PCP: Hollace Kinnier, DO   Chief Complaint: AMS  History of Present Illness:   [name] is an 79 y.o. female with a PMH of dementia who was brought to the ER for evaluation of AMS.  The patient is unable to provide any history and no family is at the bedside. I was able to contact [name], the patient's daughter, by telephone, who says that over the past few weeks, she has noticed that her mother has been less interactive and not as verbal.  [name] visited her today, and said she was unable to rouse her.  She was able to get her to sip on some tea but was essentially not responsive.  [name] ultimately insisted that her mother be sent to the ER for further evaluation.  Nursing staff told the daughter that she hadn't eaten in a few days, but was drinking "fine".  At baseline, the patient can recognize her daughter, but is minimally verbal. She has been wheelchair bound for the last 6 months.  ROS:   Unable to obtain secondary to advanced dementia, patient non-verbal.   Past Medical History:   Past Medical History  Diagnosis Date  . Thyroid cancer   . Ulcer     chronic  . Depression   . Vitamin D deficiency   . Anxiety   . Malaise and fatigue   . Osteoarthritis   . Kidney disease   . Pneumonia   . Dementia   . Alzheimer disease   . Hypothyroidism   . Bipolar 1 disorder   . Dehydration   . Hypertension   . Diabetes mellitus   . Pressure ulcer stage III   . Constipation   . Open-angle glaucoma   . Anemia   . Peripheral neuropathy   . Hypertensive heart disease   . CKD (chronic kidney disease), stage III   . Osteoarthrosis   . Mixed hyperlipidemia     Past Surgical History:   Past Surgical History  Procedure Laterality Date  . Esophagogastroduodenoscopy  02/23/2011    Procedure: ESOPHAGOGASTRODUODENOSCOPY (EGD);  Surgeon: Beryle Beams, MD;   Location: [facility];  Service: Endoscopy;  Laterality: N/A;  . Abdominal hysterectomy    . Foot surgery    . Retinal detachment surgery    . Thyroidectomy      Social History:   History   Social History  . Marital Status: Divorced    Spouse Name: N/A    Number of Children: 3  . Years of Education: N/A   Occupational History  . Retired Network engineer    Social History Main Topics  . Smoking status: Never Smoker   . Smokeless tobacco: Not on file  . Alcohol Use: No  . Drug Use: No  . Sexual Activity: No   Other Topics Concern  . Not on file   Social History Narrative   Divorced.  Lives at [facility].      Family history:   Family History  Problem Relation Age of Onset  . Diabetes Brother     Unable to obtain additional history.  Allergies   Lithium; Penicillins; and Sulfa antibiotics  Current Medications:   Prior to Admission medications   Medication Sig Start Date End Date Taking? Authorizing Provider  atorvastatin (LIPITOR) 10 MG tablet Take 10 mg by mouth at bedtime.     Historical Provider, MD  bimatoprost (LUMIGAN)  0.03 % ophthalmic solution Place 1 drop into both eyes at bedtime.    Historical Provider, MD  calcium-vitamin D (OSCAL WITH D) 500-200 MG-UNIT per tablet Take 2 tablets by mouth daily.     Historical Provider, MD  cholecalciferol (VITAMIN D) 1000 UNITS tablet Take 1,000 Units by mouth daily.    Historical Provider, MD  desmopressin (DDAVP) 0.2 MG tablet 1 by mouth one time a day, 2 by mouth two times a day (Nursing Home Instructions)    Historical Provider, MD  diclofenac sodium (VOLTAREN) 1 % GEL Apply 1 application topically 4 (four) times daily. Apply 4 grams to left shoulder and left knee     Historical Provider, MD  hydrOXYzine (ATARAX/VISTARIL) 10 MG tablet Take 10 mg by mouth every 12 (twelve) hours. For Anxiety    Historical Provider, MD  lansoprazole (PREVACID) 30 MG capsule Take 30 mg by mouth 2 (two) times daily.     Historical Provider, MD  levothyroxine (SYNTHROID, LEVOTHROID) 125 MCG tablet Take 125 mcg by mouth daily before breakfast.    Historical Provider, MD  Multiple Vitamin (MULTIVITAMIN) tablet Take 1 tablet by mouth daily.      Historical Provider, MD  Paliperidone (INVEGA) 1.5 MG TB24 Take 1 tablet by mouth daily.    Historical Provider, MD  PARoxetine (PAXIL) 20 MG tablet Take 20 mg by mouth every morning.    Historical Provider, MD  polyethylene glycol (MIRALAX / GLYCOLAX) packet Take 17 g by mouth 2 (two) times daily.    Historical Provider, MD  sennosides-docusate sodium (SENOKOT-S) 8.6-50 MG tablet Take 2 tablets by mouth at bedtime.    Historical Provider, MD  traZODone (DESYREL) 50 MG tablet Take 25 mg by mouth at bedtime.     Historical Provider, MD    Physical Exam:   Filed Vitals:   11/01/13 1617 11/01/13 1700 11/01/13 1715 11/01/13 1800  BP: 145/98 153/82  122/85  Pulse: 92 48 85   Temp: 99.4 F (37.4 C)     TempSrc: Rectal     Resp: 14  13 19   SpO2: 100% 100%  99%     Physical Exam: Blood pressure 122/85, pulse 85, temperature 99.4 F (37.4 C), temperature source Rectal, resp. rate 19, SpO2 99.00%. Gen: No acute distress.  Somnolent. Head: Normocephalic, atraumatic. Eyes: Right pupil round, responsive, left pupil irregular with lens implant, sclerae nonicteric, unable to assess EOM. Mouth: Oropharynx with extremely dry mucous membranes. Neck: Supple, no thyromegaly, no lymphadenopathy, no jugular venous distention. Chest: Lungs decreased throughout. CV: Heart sounds are slightly irregular with premature beats. Abdomen: Soft, nontender, nondistended with normal active bowel sounds. Extremities: Extremities are without C/E/C Skin: Warm and dry. Neuro: Lethargic, disoriented; unable to assess/patient does not follow commands. Psych: Mood and affect flat.   Data Review:    Labs: Basic Metabolic Panel:  Recent Labs Lab 11/01/13 1605  NA 178*  K 3.9  CL >130*    CO2 28  GLUCOSE 195*  BUN 61*  CREATININE 1.77*  CALCIUM 9.9   Liver Function Tests:  Recent Labs Lab 11/01/13 1605  AST 15  ALT 7  ALKPHOS 96  BILITOT 0.5  PROT 7.9  ALBUMIN 3.5   CBC:  Recent Labs Lab 11/01/13 1504  WBC 9.1  HGB 15.5*  HCT 53.3*  MCV 104.7*  PLT 188   Cardiac Enzymes:  Recent Labs Lab 11/01/13 1500  TROPONINI <0.30    BNP (last 3 results)  Recent Labs  05/07/13 1300  PROBNP 118.8  CBG: No results found for this basename: GLUCAP,  in the last 168 hours  Radiographic Studies: Dg Chest Portable 1 View  11/01/2013   CLINICAL DATA:  Altered mental status  EXAM: PORTABLE CHEST - 1 VIEW  COMPARISON:  05/07/2013  FINDINGS: Patient is mildly rotated.  Chin obscures the right lung apex.  Lungs are essentially clear.  No pleural effusion or pneumothorax.  The heart is top-normal in size.  Degenerative changes of the thoracic spine and left shoulder.  IMPRESSION: No evidence of acute cardiopulmonary disease.   Electronically Signed   By: Julian Hy M.D.   On: 11/01/2013 15:49    EKG: Independently reviewed. Sinus rhythm with multiple premature complexes, ventricular & supraventricular. Right ventricular hypertrophy, Q waves in anterior and inferior leads.  Assessment/Plan:   Principal Problem:   Metabolic encephalopathy secondary to hypernatremia  Hold vasopressin (was being treated for diabetes insipidus).  Hydrate with 1/2 NS.  Monitor BMET Q 6 hours.  Aim for 10 mEq reduction Q 24 hours.  No nitrites in urine, follow up culture, hold off on empiric antibiotics (afebrile, no leukocytosis).  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  Check hemoglobin A1c.    Essential hypertension, benign  Not on any anti-hypertensives prior to admission.  Hydralazine PRN ordered for SBP > 409 or diastolic BP > 811.    Diabetes insipidus -   lithium-induced  Hold vasopressin, patient unable to safely swallow.    Dyslipidemia  Hold lipitor.    Hypothyroidism  Give synthroid through the IV route as patient cannot safely swallow.  F/U TSH.  T4 WNL but not particularly useful, need free T4, which I will order.    Moderate protein-calorie malnutrition  ST swallowing evaluation requested.    Sacral decubitus ulcer, stage II  Wound care RN to assess.    Bipolar 1 disorder, depressed  Hold Invega and Paxil.  Hold Trazodone.  Unable to safely swallow.    ARF (acute renal failure) / stage III CKD  Baseline creatinine 1.07.  Hydrate and monitor for recovery of renal function.    Alzheimer's disease  From SNF.    DVT prophylaxis  Lovenox ordered.  Code Status: Full.  Discussed with daughter who is POA. Family Communication: No family at the bedside.  Bary Richard (daughter) [phone]. Disposition Plan: Home when stable.  Time spent: 70 minutes.  Hollan Philipp [facility] Pager [phone] Cell: [phone]   If 7PM-7AM, please contact night-coverage [email or web address] Password [email or web address] 11/01/2013, 6:12 PM

## 2013-11-01 NOTE — ED Notes (Signed)
Per EMS. Pt from [place] home, hx of dementia. Daughter visited pt today and noticed pt was less alert than her previous visit a week ago. Pt normally non-verbal except for saying "hey" and tracks movement around her. Today pt completely nonverbal and tired appearing. Pt normally on 2L [place].

## 2013-11-01 NOTE — ED Notes (Signed)
Pt visually tracking movement after 1L [place]

## 2013-11-01 NOTE — ED Notes (Signed)
Bed: TD97 Expected date: 11/01/13 Expected time: 2:11 PM Means of arrival: Ambulance Comments: Lethargic (elderly)

## 2013-11-01 NOTE — ED Provider Notes (Signed)
CSN: [id number]     Arrival date & time 11/01/13  1415 History   First MD Initiated Contact with Patient 11/01/13 1501     Chief Complaint  Patient presents with  . Fatigue   Level V caveat for dementia and altered mental status  (Consider location/radiation/quality/duration/timing/severity/associated sxs/prior Treatment) HPI Patient has a history of fatigue with malaise, hypothyroidism, major depressive disorder, chronic renal disease, and Alzheimer's documented on the nursing home note from September 2015.  Patient was sent to the emergency department from her living facility.  Evidently her daughter visited today and thought the patient was less alert than she had been a week before.  They report patient is usually non- verbal.  Patient is lying on her side when I say her name she slowly awakens but she does not respond verbally.  She does not follow any commands.  PCP Dr Morton Peters  Past Medical History  Diagnosis Date  . Thyroid cancer   . Ulcer     chronic  . Depression   . Vitamin D deficiency   . Anxiety   . Malaise and fatigue   . Osteoarthritis   . Kidney disease   . Pneumonia   . Dementia   . Alzheimer disease   . Hypothyroidism   . Bipolar 1 disorder   . Dehydration   . Hypertension   . Diabetes mellitus    Past Surgical History  Procedure Laterality Date  . Esophagogastroduodenoscopy  02/23/2011    Procedure: ESOPHAGOGASTRODUODENOSCOPY (EGD);  Surgeon: Beryle Beams, MD;  Location: [facility] ENDOSCOPY;  Service: Endoscopy;  Laterality: N/A;  . Abdominal hysterectomy    . Foot surgery    . Retinal detachment surgery    . Thyroidectomy     No family history on file. History  Substance Use Topics  . Smoking status: Never Smoker   . Smokeless tobacco: Not on file  . Alcohol Use: No   Lives in SNF  OB History   Grav Para Term Preterm Abortions TAB SAB Ect Mult Living                 Review of Systems  Unable to perform ROS: Dementia      Allergies   Lithium; Penicillins; and Sulfa antibiotics  Home Medications   Prior to Admission medications   Medication Sig Start Date End Date Taking? Authorizing Provider  atorvastatin (LIPITOR) 10 MG tablet Take 10 mg by mouth at bedtime.     Historical Provider, MD  bimatoprost (LUMIGAN) 0.03 % ophthalmic solution Place 1 drop into both eyes at bedtime.    Historical Provider, MD  calcium-vitamin D (OSCAL WITH D) 500-200 MG-UNIT per tablet Take 2 tablets by mouth daily.     Historical Provider, MD  cholecalciferol (VITAMIN D) 1000 UNITS tablet Take 1,000 Units by mouth daily.    Historical Provider, MD  desmopressin (DDAVP) 0.2 MG tablet 1 by mouth one time a day, 2 by mouth two times a day (Nursing Home Instructions)    Historical Provider, MD  diclofenac sodium (VOLTAREN) 1 % GEL Apply 1 application topically 4 (four) times daily. Apply 4 grams to left shoulder and left knee     Historical Provider, MD  hydrOXYzine (ATARAX/VISTARIL) 10 MG tablet Take 10 mg by mouth every 12 (twelve) hours. For Anxiety    Historical Provider, MD  lansoprazole (PREVACID) 30 MG capsule Take 30 mg by mouth 2 (two) times daily.    Historical Provider, MD  levothyroxine (SYNTHROID, LEVOTHROID) 125 MCG  tablet Take 125 mcg by mouth daily before breakfast.    Historical Provider, MD  Multiple Vitamin (MULTIVITAMIN) tablet Take 1 tablet by mouth daily.      Historical Provider, MD  Paliperidone (INVEGA) 1.5 MG TB24 Take 1 tablet by mouth daily.    Historical Provider, MD  PARoxetine (PAXIL) 20 MG tablet Take 20 mg by mouth every morning.    Historical Provider, MD  polyethylene glycol (MIRALAX / GLYCOLAX) packet Take 17 g by mouth 2 (two) times daily.    Historical Provider, MD  sennosides-docusate sodium (SENOKOT-S) 8.6-50 MG tablet Take 2 tablets by mouth at bedtime.    Historical Provider, MD  traZODone (DESYREL) 50 MG tablet Take 25 mg by mouth at bedtime.     Historical Provider, MD   BP 124/73  Pulse 104  Temp(Src)  98.7 F (37.1 C) (Oral)  Resp 16  SpO2 98%  Vital signs normal except tachycardia  Physical Exam  Nursing note and vitals reviewed. Constitutional: She is oriented to person, place, and time.  Non-toxic appearance. She does not appear ill. No distress.  Thin elderly female  HENT:  Head: Normocephalic and atraumatic.  Right Ear: External ear normal.  Left Ear: External ear normal.  Nose: Nose normal. No mucosal edema or rhinorrhea.  Mouth/Throat: Mucous membranes are normal. No dental abscesses or uvula swelling.  Will not open mouth, but her lips are dry and cracked and the tip of her tongue is dry  Eyes: Conjunctivae and EOM are normal. Pupils are equal, round, and reactive to light.  Right cornea is slightly hazy  Neck: Normal range of motion and full passive range of motion without pain. Neck supple.  Cardiovascular: Regular rhythm and normal heart sounds.  Tachycardia present.  Exam reveals no gallop and no friction rub.   No murmur heard. Pulmonary/Chest: Effort normal and breath sounds normal. No respiratory distress. She has no wheezes. She has no rhonchi. She has no rales. She exhibits no tenderness and no crepitus.  Abdominal: Soft. Normal appearance and bowel sounds are normal. She exhibits no distension. There is no tenderness. There is no rebound and no guarding.  Musculoskeletal: Normal range of motion. She exhibits no edema and no tenderness.  No obvious deformity of extremities. Pt has special orthopedic shoes   Neurological: She is alert and oriented to person, place, and time. She has normal strength. No cranial nerve deficit.  Skin: Skin is dry and intact. No rash noted. No erythema. No pallor.  Feels cold to touch  Psychiatric: Her speech is normal. Her mood appears not anxious.  Pt is delayed in responses and is nonverbal    ED Course  Procedures (including critical care time)  Medications  0.9 %  sodium chloride infusion (1,000 mLs Intravenous New Bag/Given  11/01/13 1549)    Followed by  0.9 %  sodium chloride infusion (not administered)    Pt was started on IV fluids for apparent dehydration by exam. Pt also does not appear that she would be able to feed herself or drink.   17:22 Dr Rockne Menghini, admit to med-surg.  Labs Review Results for orders placed during the hospital encounter of 11/01/13  CBC      Result Value Ref Range   WBC 9.1  4.0 - 10.5 K/uL   RBC 5.09  3.87 - 5.11 MIL/uL   Hemoglobin 15.5 (*) 12.0 - 15.0 g/dL   HCT 53.3 (*) 36.0 - 46.0 %   MCV 104.7 (*) 78.0 - 100.0 fL  MCH 30.5  26.0 - 34.0 pg   MCHC 29.1 (*) 30.0 - 36.0 g/dL   RDW 15.7 (*) 11.5 - 15.5 %   Platelets 188  150 - 400 K/uL  PROTIME-INR      Result Value Ref Range   Prothrombin Time 14.5  11.6 - 15.2 seconds   INR 1.12  0.00 - 1.49  URINALYSIS, ROUTINE W REFLEX MICROSCOPIC      Result Value Ref Range   Color, Urine YELLOW  YELLOW   APPearance CLOUDY (*) CLEAR   Specific Gravity, Urine 1.022  1.005 - 1.030   pH 7.0  5.0 - 8.0   Glucose, UA NEGATIVE  NEGATIVE mg/dL   Hgb urine dipstick NEGATIVE  NEGATIVE   Bilirubin Urine SMALL (*) NEGATIVE   Ketones, ur NEGATIVE  NEGATIVE mg/dL   Protein, ur 30 (*) NEGATIVE mg/dL   Urobilinogen, UA 1.0  0.0 - 1.0 mg/dL   Nitrite NEGATIVE  NEGATIVE   Leukocytes, UA SMALL (*) NEGATIVE  TROPONIN I      Result Value Ref Range   Troponin I <0.30  <0.30 ng/mL  COMPREHENSIVE METABOLIC PANEL      Result Value Ref Range   Sodium 178 (*) 137 - 147 mEq/L   Potassium 3.9  3.7 - 5.3 mEq/L   Chloride >130 (*) 96 - 112 mEq/L   CO2 28  19 - 32 mEq/L   Glucose, Bld 195 (*) 70 - 99 mg/dL   BUN 61 (*) 6 - 23 mg/dL   Creatinine, Ser 1.77 (*) 0.50 - 1.10 mg/dL   Calcium 9.9  8.4 - 10.5 mg/dL   Total Protein 7.9  6.0 - 8.3 g/dL   Albumin 3.5  3.5 - 5.2 g/dL   AST 15  0 - 37 U/L   ALT 7  0 - 35 U/L   Alkaline Phosphatase 96  39 - 117 U/L   Total Bilirubin 0.5  0.3 - 1.2 mg/dL   GFR calc non Af Amer 27 (*) >90 mL/min   GFR calc Af  Amer 31 (*) >90 mL/min   Anion gap PENDING  5 - 15  URINE MICROSCOPIC-ADD ON      Result Value Ref Range   Squamous Epithelial / LPF FEW (*) RARE   WBC, UA 3-6  <3 WBC/hpf   RBC / HPF 0-2  <3 RBC/hpf   Bacteria, UA MANY (*) RARE   Casts GRANULAR CAST (*) NEGATIVE   Crystals CA OXALATE CRYSTALS (*) NEGATIVE   Urine-Other MUCOUS PRESENT     Laboratory interpretation all normal except concentrated Hb, hypernatremia, elevated chloride, acute renal insufficiency all c/w dehydration      Imaging Review Dg Chest Portable 1 View  11/01/2013   CLINICAL DATA:  Altered mental status  EXAM: PORTABLE CHEST - 1 VIEW  COMPARISON:  05/07/2013  FINDINGS: Patient is mildly rotated.  Chin obscures the right lung apex.  Lungs are essentially clear.  No pleural effusion or pneumothorax.  The heart is top-normal in size.  Degenerative changes of the thoracic spine and left shoulder.  IMPRESSION: No evidence of acute cardiopulmonary disease.   Electronically Signed   By: Julian Hy M.D.   On: 11/01/2013 15:49     EKG Interpretation   Date/Time:  Sunday November 01 2013 16:17:18 EDT Ventricular Rate:  92 PR Interval:  126 QRS Duration: 75 QT Interval:  374 QTC Calculation: 463 R Axis:   -80 Text Interpretation:  Sinus rhythm Multiple premature complexes, vent  Consider right ventricular hypertrophy Inferior infarct, old Consider  anterior infarct Lateral leads are also involved Electrode noise Since  last tracing rate faster (07 May 2013) Confirmed by [facility]  MD-I, Jorey Dollard  ([id number]) on 11/01/2013 4:25:15 PM      MDM   Final diagnoses:  Altered mental status  Hypernatremia  Chloride, increased level  Dehydration    Plan admission   Rolland Porter, MD, Alanson Aly, MD 11/01/13 [phone]

## 2013-11-02 DIAGNOSIS — E43 Unspecified severe protein-calorie malnutrition: Secondary | ICD-10-CM | POA: Insufficient documentation

## 2013-11-02 DIAGNOSIS — E878 Other disorders of electrolyte and fluid balance, not elsewhere classified: Secondary | ICD-10-CM

## 2013-11-02 DIAGNOSIS — E860 Dehydration: Secondary | ICD-10-CM

## 2013-11-02 DIAGNOSIS — N189 Chronic kidney disease, unspecified: Secondary | ICD-10-CM

## 2013-11-02 DIAGNOSIS — F0390 Unspecified dementia without behavioral disturbance: Secondary | ICD-10-CM

## 2013-11-02 DIAGNOSIS — E1122 Type 2 diabetes mellitus with diabetic chronic kidney disease: Secondary | ICD-10-CM

## 2013-11-02 DIAGNOSIS — I10 Essential (primary) hypertension: Secondary | ICD-10-CM

## 2013-11-02 DIAGNOSIS — E232 Diabetes insipidus: Secondary | ICD-10-CM

## 2013-11-02 LAB — CBC
HCT: 46 % (ref 36.0–46.0)
HEMOGLOBIN: 13.2 g/dL (ref 12.0–15.0)
MCH: 29.9 pg (ref 26.0–34.0)
MCHC: 28.7 g/dL — ABNORMAL LOW (ref 30.0–36.0)
MCV: 104.3 fL — AB (ref 78.0–100.0)
Platelets: 163 10*3/uL (ref 150–400)
RBC: 4.41 MIL/uL (ref 3.87–5.11)
RDW: 15.7 % — AB (ref 11.5–15.5)
WBC: 8.7 10*3/uL (ref 4.0–10.5)

## 2013-11-02 LAB — COMPREHENSIVE METABOLIC PANEL
ALK PHOS: 96 U/L (ref 39–117)
ALT: 7 U/L (ref 0–35)
AST: 15 U/L (ref 0–37)
Albumin: 3.5 g/dL (ref 3.5–5.2)
BILIRUBIN TOTAL: 0.5 mg/dL (ref 0.3–1.2)
BUN: 61 mg/dL — ABNORMAL HIGH (ref 6–23)
CO2: 28 meq/L (ref 19–32)
Calcium: 9.9 mg/dL (ref 8.4–10.5)
Creatinine, Ser: 1.77 mg/dL — ABNORMAL HIGH (ref 0.50–1.10)
GFR calc Af Amer: 31 mL/min — ABNORMAL LOW (ref >90)
GFR, EST NON AFRICAN AMERICAN: 27 mL/min — AB (ref >90)
Glucose, Bld: 195 mg/dL — ABNORMAL HIGH (ref 70–99)
POTASSIUM: 3.9 meq/L (ref 3.7–5.3)
Sodium: 178 mEq/L (ref 137–147)
Total Protein: 7.9 g/dL (ref 6.0–8.3)

## 2013-11-02 LAB — BASIC METABOLIC PANEL
BUN: 57 mg/dL — ABNORMAL HIGH (ref 6–23)
BUN: 55 mg/dL — ABNORMAL HIGH (ref 6–23)
BUN: 54 mg/dL — ABNORMAL HIGH (ref 6–23)
BUN: 48 mg/dL — ABNORMAL HIGH (ref 6–23)
CALCIUM: 8.8 mg/dL (ref 8.4–10.5)
CALCIUM: 8.8 mg/dL (ref 8.4–10.5)
CO2: 28 meq/L (ref 19–32)
CO2: 26 meq/L (ref 19–32)
CO2: 25 mEq/L (ref 19–32)
CO2: 27 meq/L (ref 19–32)
CREATININE: 1.42 mg/dL — AB (ref 0.50–1.10)
Calcium: 9.3 mg/dL (ref 8.4–10.5)
Calcium: 9.1 mg/dL (ref 8.4–10.5)
Chloride: >130 mEq/L (ref 96–112)
Chloride: >130 mEq/L (ref 96–112)
Chloride: >130 mEq/L (ref 96–112)
Chloride: >130 mEq/L (ref 96–112)
Creatinine, Ser: 1.76 mg/dL — ABNORMAL HIGH (ref 0.50–1.10)
Creatinine, Ser: 1.53 mg/dL — ABNORMAL HIGH (ref 0.50–1.10)
Creatinine, Ser: 1.56 mg/dL — ABNORMAL HIGH (ref 0.50–1.10)
GFR calc Af Amer: 31 mL/min — ABNORMAL LOW (ref >90)
GFR calc Af Amer: 37 mL/min — ABNORMAL LOW (ref >90)
GFR calc Af Amer: 36 mL/min — ABNORMAL LOW (ref >90)
GFR calc Af Amer: 40 mL/min — ABNORMAL LOW (ref >90)
GFR calc non Af Amer: 27 mL/min — ABNORMAL LOW (ref >90)
GFR calc non Af Amer: 32 mL/min — ABNORMAL LOW (ref >90)
GFR, EST NON AFRICAN AMERICAN: 31 mL/min — AB (ref >90)
GFR, EST NON AFRICAN AMERICAN: 35 mL/min — AB (ref >90)
GLUCOSE: 98 mg/dL (ref 70–99)
GLUCOSE: 112 mg/dL — AB (ref 70–99)
GLUCOSE: 99 mg/dL (ref 70–99)
GLUCOSE: 175 mg/dL — AB (ref 70–99)
POTASSIUM: 3.8 meq/L (ref 3.7–5.3)
Potassium: 4.5 mEq/L (ref 3.7–5.3)
Potassium: 3.9 mEq/L (ref 3.7–5.3)
Potassium: 3.5 mEq/L — ABNORMAL LOW (ref 3.7–5.3)
Sodium: 176 mEq/L (ref 137–147)
Sodium: 175 mEq/L (ref 137–147)
Sodium: 170 mEq/L (ref 137–147)
Sodium: 166 mEq/L (ref 137–147)

## 2013-11-02 LAB — GLUCOSE, CAPILLARY
GLUCOSE-CAPILLARY: 91 mg/dL (ref 70–99)
GLUCOSE-CAPILLARY: 104 mg/dL — AB (ref 70–99)
GLUCOSE-CAPILLARY: 156 mg/dL — AB (ref 70–99)
Glucose-Capillary: 101 mg/dL — ABNORMAL HIGH (ref 70–99)
Glucose-Capillary: 129 mg/dL — ABNORMAL HIGH (ref 70–99)
Glucose-Capillary: 189 mg/dL — ABNORMAL HIGH (ref 70–99)

## 2013-11-02 LAB — MRSA PCR SCREENING
MRSA BY PCR: INVALID — AB
MRSA by PCR: NEGATIVE

## 2013-11-02 LAB — HEMOGLOBIN A1C
Hgb A1c MFr Bld: 6.4 % — ABNORMAL HIGH (ref <5.7)
MEAN PLASMA GLUCOSE: 137 mg/dL — AB (ref <117)

## 2013-11-02 LAB — T4, FREE: Free T4: 1.04 ng/dL (ref 0.80–1.80)

## 2013-11-02 MED ORDER — PALIPERIDONE ER 1.5 MG PO TB24: 1.0000 | ORAL_TABLET | Freq: Every day | ORAL | Status: DC

## 2013-11-02 MED ORDER — DEXTROSE 5 % IV SOLN
INTRAVENOUS | Status: DC
  Administered 2013-11-02 – 2013-11-05 (×8): via INTRAVENOUS

## 2013-11-02 MED ORDER — PAROXETINE HCL 20 MG PO TABS
20.0000 mg | ORAL_TABLET | Freq: Every day | ORAL | Status: DC
  Administered 2013-11-02 – 2013-11-06 (×4): 20 mg via ORAL
  Filled 2013-11-02 (×5): qty 1

## 2013-11-02 MED ORDER — ENSURE COMPLETE PO LIQD
237.0000 mL | Freq: Two times a day (BID) | ORAL | Status: DC
  Administered 2013-11-06: 237 mL via ORAL

## 2013-11-02 MED ORDER — LEVOTHYROXINE SODIUM 125 MCG PO TABS
125.0000 ug | ORAL_TABLET | Freq: Every day | ORAL | Status: DC
  Filled 2013-11-02 (×2): qty 1

## 2013-11-02 MED ORDER — TRAZODONE HCL 50 MG PO TABS: 25.0000 mg | ORAL_TABLET | Freq: Every evening | ORAL | Status: DC | PRN

## 2013-11-02 MED ORDER — ATORVASTATIN CALCIUM 10 MG PO TABS
10.0000 mg | ORAL_TABLET | Freq: Every day | ORAL | Status: DC
  Administered 2013-11-02 – 2013-11-05 (×4): 10 mg via ORAL
  Filled 2013-11-02 (×6): qty 1

## 2013-11-02 MED ORDER — PALIPERIDONE ER 1.5 MG PO TB24
1.5000 mg | ORAL_TABLET | Freq: Every morning | ORAL | Status: DC
  Filled 2013-11-02 (×2): qty 1

## 2013-11-02 NOTE — Progress Notes (Signed)
INITIAL NUTRITION ASSESSMENT  Pt meets criteria for severe MALNUTRITION in the context of chronic as evidenced by weight loss of 23% in the past year, intake <75% estimated needs for > 1 month.  DOCUMENTATION CODES Per approved criteria  -Severe malnutrition in the context of chronic illness   INTERVENTION:  NPO with diet advancement per MD  Consider Swallow eval with SLP when mental status improves.  Add supplement after diet advancement.  NUTRITION DIAGNOSIS: Inadequate oral intake related to inability to eat as evidenced by npo status.   Goal: Tolerate diet advancement with intake of meals and supplements to meet >90% estimated needs.  Monitor:  Plan of care, swallow study results, labs, good.  Reason for Assessment: MST and low braden  79 y.o. female  Admitting Dx: Metabolic encephalopathy  ASSESSMENT: Patient with hx of Alzheimer's dementia admitted with metabolic encephalopathy secondary to hypernatremia, diabetes insipidus- lithium induced.  10/26: -Patient NPO with plans for swallow eval when appropriate per MD notes.   -Per SNF, patient hadn't eaten in a few days but was drinking "fine".  Pureed diet. -Sacral decubitus ulcer stage II  -158 lbs 1 year ago.  36 lb weight loss (23%) in the past year.  Height: Ht Readings from Last 1 Encounters:  11/01/13 5\' 4"  (1.626 m)    Weight: Wt Readings from Last 1 Encounters:  11/01/13 122 lb 3.2 oz (55.43 kg)    Ideal Body Weight: 120 lbs  % Ideal Body Weight: 102  Wt Readings from Last 10 Encounters:  11/01/13 122 lb 3.2 oz (55.43 kg)  08/26/13 135 lb (61.236 kg)  06/24/13 148 lb (67.132 kg)  01/21/13 152 lb (68.947 kg)  01/04/13 139 lb 15.9 oz (63.5 kg)  09/19/12 158 lb (71.668 kg)  07/30/12 164 lb (74.39 kg)  06/03/12 153 lb (69.4 kg)  04/11/12 154 lb (69.854 kg)  09/06/11 150 lb 9.2 oz (68.3 kg)    Usual Body Weight: 158 lbs 1 year ago  % Usual Body Weight: 77  BMI:  Body mass index is 20.97  kg/(m^2).  Estimated Nutritional Needs: Kcal: 1400-1600 Protein: 70-80 gm Fluid: >1.4L daily  Skin: Stage 2 decubitus  Diet Order: NPO  EDUCATION NEEDS: -No education needs identified at this time   Intake/Output Summary (Last 24 hours) at 11/02/13 1217 Last data filed at 11/02/13 0600  Gross per 24 hour  Intake 1168.75 ml  Output      0 ml  Net 1168.75 ml     Labs:   Recent Labs Lab 11/01/13 2155 11/02/13 0405 11/02/13 0955  NA 176* 175* 170*  K 4.5 3.8 3.9  CL >130* >130* >130*  CO2 28 26 25   BUN 57* 55* 54*  CREATININE 1.76* 1.53* 1.56*  CALCIUM 9.3 9.1 8.8  GLUCOSE 98 112* 99    CBG (last 3)   Recent Labs  11/01/13 2356 11/02/13 0539 11/02/13 0806  GLUCAP 92 101* 91    Scheduled Meds: . antiseptic oral rinse  7 mL Mouth Rinse q12n4p  . chlorhexidine  15 mL Mouth Rinse BID  . enoxaparin (LOVENOX) injection  30 mg Subcutaneous Q24H  . insulin aspart  0-9 Units Subcutaneous 6 times per day  . latanoprost  1 drop Both Eyes QHS  . levothyroxine  62.5 mcg Intravenous Daily    Continuous Infusions: . dextrose 125 mL/hr at 11/02/13 1131    Past Medical History  Diagnosis Date  . Thyroid cancer   . Ulcer     chronic  .  Depression   . Vitamin D deficiency   . Anxiety   . Malaise and fatigue   . Osteoarthritis   . Kidney disease   . Pneumonia   . Dementia   . Alzheimer disease   . Hypothyroidism   . Bipolar 1 disorder   . Dehydration   . Hypertension   . Diabetes mellitus   . Pressure ulcer stage III   . Constipation   . Open-angle glaucoma   . Anemia   . Peripheral neuropathy   . Hypertensive heart disease   . CKD (chronic kidney disease), stage III   . Osteoarthrosis   . Mixed hyperlipidemia     Past Surgical History  Procedure Laterality Date  . Esophagogastroduodenoscopy  02/23/2011    Procedure: ESOPHAGOGASTRODUODENOSCOPY (EGD);  Surgeon: Beryle Beams, MD;  Location: [facility] ENDOSCOPY;  Service: Endoscopy;  Laterality: N/A;   . Abdominal hysterectomy    . Foot surgery    . Retinal detachment surgery    . Thyroidectomy      Antonieta Iba, RD, LDN Clinical Inpatient Dietitian Pager:  [phone] Weekend and after hours pager:  [phone]

## 2013-11-02 NOTE — Progress Notes (Signed)
CRITICAL VALUE ALERT  Critical value received:  Sodium 175, Chloride >130  Date of notification: 11/02/13  Time of notification:  0520  Critical value read back:Yes  Nurse who received alert:  Azzie Glatter, RN  MD notified (1st page): N/A (Schorr notified earlier in the night; labs consistent with previous lab values)  Time of first page:  n/a  MD notified (2nd page): n/a  Time of second page: n/a  Responding MD:  n/a  Time MD responded:n/a

## 2013-11-02 NOTE — Progress Notes (Addendum)
Progress Note   [name] LZJ:[id number] DOB: [birth year] DOA: 11/01/2013 PCP: Hollace Kinnier, DO   Brief Narrative:   [name] is an 79 y.o. female the PMH of dementia, hypothyroidism, diabetes insipidus, bipolar 1 disorder, and diabetes who was brought to the hospital from her SNF with altered mental status. Upon initial evaluation in the ED, the patient was found to be severely hypernatremic.  Her mental status has improved with slow correction of her sodium.    Assessment/Plan:   Principal Problem:  Metabolic encephalopathy secondary to hypernatremia  Hold vasopressin (was being treated for diabetes insipidus).  Hydrate with 1/2 NS. Monitor BMET Q 6 hours. Aim for 10 mEq reduction Q 24 hours. Sodium slowly coming down. No nitrites in urine, follow up culture, hold off on empiric antibiotics (afebrile, no leukocytosis).  Active Problems:  Severe protein calorie malnutrition  Seen by the dietitian 11/02/13. Advance diet to dysphagia 3/ground meat/thin liquids with full supervision per speech therapy recommendations.  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 Hemoglobin A1c 6.4% indicating good outpatient control. CBGs 83-101.  Essential hypertension, benign  Not on any anti-hypertensives prior to admission.  Hydralazine PRN ordered for SBP > 024 or diastolic BP > 097.  Diabetes insipidus - lithium-induced  Hold vasopressin, patient unable to safely swallow.  Dyslipidemia  Resume lipitor.  Hypothyroidism  Resume PO synthroid.  TSH 0.048, free T4 normal at 1.04.  Sacral decubitus ulcer, stage II  Wound care RN to assess.  Bipolar 1 disorder, depressed  Resume Invega and Paxil.  Resume Trazodone.   ARF (acute renal failure) / stage III CKD  Baseline creatinine 1.07. Hydrate and monitor for recovery of renal function. Renal function beginning to improve.  Alzheimer's disease  From SNF.  DVT  prophylaxis  Lovenox ordered.  Code Status: Full. Discussed with daughter who is POA.  Family Communication: Bary Richard (daughter) [phone], updated by telephone 11/02/13.  Disposition Plan: SNF when stable.    IV Access:    Peripheral IV   Procedures and diagnostic studies:   Dg Chest Portable 1 View 11/01/2013: No evidence of acute cardiopulmonary disease.     Medical Consultants:    None.  Anti-Infectives:    None.  Subjective:   [name] is more responsive today, but speech unintelligible.    Objective:    Filed Vitals:   11/01/13 1800 11/01/13 1900 11/01/13 2325 11/02/13 0540  BP: 122/85 117/63  127/92  Pulse:  70  70  Temp:  98 F (36.7 C)  97.8 F (36.6 C)  TempSrc:  Axillary  Axillary  Resp: 19 16  16   Height:   5\' 4"  (1.626 m)   Weight:   55.43 kg (122 lb 3.2 oz)   SpO2: 99% 100%  98%    Intake/Output Summary (Last 24 hours) at 11/02/13 0745 Last data filed at 11/01/13 1958  Gross per 24 hour  Intake      0 ml  Output      0 ml  Net      0 ml    Exam: Gen:  NAD Cardiovascular:  RRR, No M/R/G Respiratory:  Lungs CTAB Gastrointestinal:  Abdomen soft, NT/ND, + BS Extremities:  No C/E/C   Data Reviewed:    Labs: Basic Metabolic Panel:  Recent Labs Lab 11/01/13 1605 11/01/13 2155 11/02/13 0405  NA 178* 176* 175*  K 3.9 4.5 3.8  CL >130* >130* >130*  CO2 28  28 26  GLUCOSE 195* 98 112*  BUN 61* 57* 55*  CREATININE 1.77* 1.76* 1.53*  CALCIUM 9.9 9.3 9.1   GFR Estimated Creatinine Clearance: 26.6 ml/min (by C-G formula based on Cr of 1.53). Liver Function Tests:  Recent Labs Lab 11/01/13 1605  AST 15  ALT 7  ALKPHOS 96  BILITOT 0.5  PROT 7.9  ALBUMIN 3.5   Coagulation profile  Recent Labs Lab 11/01/13 1504  INR 1.12    CBC:  Recent Labs Lab 11/01/13 1504 11/02/13 0405  WBC 9.1 8.7  HGB 15.5* 13.2  HCT 53.3* 46.0  MCV 104.7* 104.3*  PLT 188 163   Cardiac Enzymes:  Recent Labs Lab  11/01/13 1500  TROPONINI <0.30   BNP (last 3 results)  Recent Labs  05/07/13 1300  PROBNP 118.8   CBG:  Recent Labs Lab 11/01/13 2041 11/01/13 2356 11/02/13 0539  GLUCAP 83 92 101*   Hgb A1c:  Recent Labs  11/01/13 2155  HGBA1C 6.4*   Thyroid function studies:  Recent Labs  11/01/13 1500 11/01/13 1605  TSH  --  0.048*  T4TOTAL 8.1  --    Microbiology Recent Results (from the past 240 hour(s))  MRSA PCR SCREENING     Status: Abnormal   Collection Time    11/01/13 10:55 PM      Result Value Ref Range Status   MRSA by PCR INVALID RESULTS, SPECIMEN SENT FOR CULTURE (*) NEGATIVE Final   Comment: RESULT CALLED TO, READ BACK BY AND VERIFIED WITH:     ACQUAAH,E RN AT 0103 10.26.15 BY TIBBITTS,K                The GeneXpert MRSA Assay (FDA     approved for NASAL specimens     only), is one component of a     comprehensive MRSA colonization     surveillance program. It is not     intended to diagnose MRSA     infection nor to guide or     monitor treatment for     MRSA infections.     Medications:   . antiseptic oral rinse  7 mL Mouth Rinse q12n4p  . chlorhexidine  15 mL Mouth Rinse BID  . enoxaparin (LOVENOX) injection  30 mg Subcutaneous Q24H  . insulin aspart  0-9 Units Subcutaneous 6 times per day  . latanoprost  1 drop Both Eyes QHS  . levothyroxine  62.5 mcg Intravenous Daily   Continuous Infusions: . sodium chloride 125 mL/hr at 11/02/13 0358    Time spent: 35 minutes.  The patient is medically complex and requires high complexity decision making.   LOS: 1 day   RAMA,CHRISTINA  [facility] Pager [phone]. If unable to reach me by pager, please call my cell phone at [phone].  *Please refer to [email or web address], password [email or web address] to get updated schedule on who will round on this patient, as hospitalists switch teams weekly. If 7PM-7AM, please contact night-coverage at [email or web address], password [email or web address] for any overnight needs.  11/02/2013, 7:45 AM

## 2013-11-02 NOTE — Progress Notes (Addendum)
CSW continuing to follow for disposition needs.   CSW followed up with pt daughter, [name] via telephone.  CSW discussed with pt daughter the bed offers that pt has for long term care placement as pt daughter interested in exploring other options.   Pt daughter asked CSW to leave list in pt room in order for her to review list and make decision.   CSW provided SNF bed offers in pt room.  CSW to continue to follow to provide support and assist with pt disposition needs.   Alison Murray, MSW, [employer] [phone]

## 2013-11-02 NOTE — Consult Note (Signed)
WOC wound consult note Reason for Consult:pressure Ulcer at sacrum, sDTI (POA).  Patient is grossly incontinence of urine at this time.  Recent large void requires bed change and bathing of patient. Wound type:pressure Pressure Ulcer POA: Yes Measurement:5cm x 6.5cm with no depth.  Presentation is consistent with that of a suspected deep tissue injury:  Mirror-image presentation, intact skin with deep purple/maroon discoloration.  Thin friable layer of epidermis covering discoloration that is soon ready to lift off (separate). Wound bed: As described above. Drainage (amount, consistency, odor) None Periwound: intact, dry Dressing procedure/placement/frequency: I will provide guidance to nursing for a turning and repositioning schedule to limit positioning in the supine position where her sacrum and heels are at greatest risk for pressure ulceration.  Pressure redistribution bilateral heel boots are provided.  A soft silicone foam dressing is to be placed over the affected area at the sacrum and changed twice weekly and PRN for soiling. Guidance is provided for care of urinary incontinence: use only one cloth underpad beneath patient and provide timely incontinence care with our house skin care product line. [place] nursing team will not follow, but will remain available to this patient, the nursing and medical teams.  Please re-consult if needed. Thanks, Maudie Flakes, MSN, RN, [place], [place], [place] [phone])

## 2013-11-02 NOTE — Evaluation (Signed)
Clinical/Bedside Swallow Evaluation Patient Details  Name: [name] MRN: [id number] Date of Birth: Oct 18, 1934  Today's Date: 11/02/2013 Time: 3220-2542 SLP Time Calculation (min): 29 min  Past Medical History:  Past Medical History  Diagnosis Date  . Thyroid cancer   . Ulcer     chronic  . Depression   . Vitamin D deficiency   . Anxiety   . Malaise and fatigue   . Osteoarthritis   . Kidney disease   . Pneumonia   . Dementia   . Alzheimer disease   . Hypothyroidism   . Bipolar 1 disorder   . Dehydration   . Hypertension   . Diabetes mellitus   . Pressure ulcer stage III   . Constipation   . Open-angle glaucoma   . Anemia   . Peripheral neuropathy   . Hypertensive heart disease   . CKD (chronic kidney disease), stage III   . Osteoarthrosis   . Mixed hyperlipidemia    Past Surgical History:  Past Surgical History  Procedure Laterality Date  . Esophagogastroduodenoscopy  02/23/2011    Procedure: ESOPHAGOGASTRODUODENOSCOPY (EGD);  Surgeon: Beryle Beams, MD;  Location: [facility];  Service: Endoscopy;  Laterality: N/A;  . Abdominal hysterectomy    . Foot surgery    . Retinal detachment surgery    . Thyroidectomy     HPI:  79 yo female adm [facility] from SNF with AMS, poor intake x several days - found to have elevated sodium.  Pt has h/o dementia and has been wheelchair bound for six months per MD note.  EGD completed in 2013 with findings of small Zenker's diverticulum and hiatal hernia.  CXR negative.  Family requested SLP swallow evaluation.     Assessment / Plan / Recommendation Clinical Impression  Pt presents with oral dysphagia consistent with diagnosis in sequalae of dementia.    Lingual musculature appears with fissures - concerning for hydration deficits.  Pt able to phonate during evaluation with clear voice throughout.  Cough x1 of approximately 10 boluses noted with thin via cup.  Improved tolerance of thin via straw - even sequential swallows noted.    Pt has h/o Zenker's diverticulum and hiatal hernia per EDG in 2013- ? improved muscular recruitment with larger bolus amount.   Adequate mastication although slow noted with solids without residuals.     Recommend dys3/ground meats/thin diet with full supervision.    SLP educated pt to findings and recommendations- pt reports occasionally coughing with liquids more than solids prior to admission.  Will follow up for family education and pt tolerance.        Aspiration Risk  Mild    Diet Recommendation Dysphagia 3 (Mechanical Soft);Thin liquid   Liquid Administration via: Cup;Straw Medication Administration: Whole meds with puree Supervision: Full supervision/cueing for compensatory strategies Compensations: Slow rate;Small sips/bites Postural Changes and/or Swallow Maneuvers: Seated upright 90 degrees;Upright 30-60 min after meal    Other  Recommendations Oral Care Recommendations: Oral care BID   Follow Up Recommendations  Skilled Nursing facility (short term for tolerance of dietary advancement)    Frequency and Duration min 1 x/week  1 week   Pertinent Vitals/Pain Low grade temp, clear lungs      Swallow Study Prior Functional Status   pt resides at [facility] Date of Onset: 11/02/13 HPI: 79 yo female adm to [facility] from SNF with AMS, poor intake x several days - found to have elevated sodium.  Pt has h/o dementia and  has been wheelchair bound for six months per MD note.  EGD completed in 2013 with findings of small Zenker's diverticulum and hiatal hernia.  CXR negative.  Family requested SLP swallow evaluation.   Type of Study: Bedside swallow evaluation Diet Prior to this Study: NPO Temperature Spikes Noted: Yes (low grade) Respiratory Status: Nasal cannula History of Recent Intubation: No Behavior/Cognition: Alert;Cooperative;Doesn't follow directions;Requires cueing (pt has dementia, follows some commands) Oral Cavity - Dentition: Adequate natural  dentition Self-Feeding Abilities: Total assist (pt leaning to right, could not lift her arm to self feed) Patient Positioning: Upright in bed Baseline Vocal Quality: Clear Volitional Cough: Cognitively unable to elicit Volitional Swallow: Unable to elicit    Oral/Motor/Sensory Function Overall Oral Motor/Sensory Function:  (no focal CN deficits, generalized weakness)   Ice Chips Ice chips: Impaired Presentation: Spoon Oral Phase Impairments: Reduced lingual movement/coordination Oral Phase Functional Implications: Prolonged oral transit Pharyngeal Phase Impairments: Suspected delayed Swallow   Thin Liquid Thin Liquid: Impaired Presentation: Spoon;Cup;Straw Oral Phase Impairments: Reduced lingual movement/coordination Pharyngeal  Phase Impairments: Cough - Immediate Other Comments: cough noted after swallow of thin x1 of approximately 10 boluses    Nectar Thick Nectar Thick Liquid: Impaired Presentation: Cup;Straw Oral Phase Impairments: Reduced lingual movement/coordination   Honey Thick Honey Thick Liquid: Not tested   Puree Puree: Impaired Presentation: Spoon Oral Phase Impairments: Impaired anterior to posterior transit;Reduced lingual movement/coordination Oral Phase Functional Implications: Prolonged oral transit Pharyngeal Phase Impairments: Multiple swallows   Solid   GO    Solid: Impaired Oral Phase Impairments: Reduced lingual movement/coordination;Impaired anterior to posterior transit Oral Phase Functional Implications: Oral holding (slow but effective mastication) Pharyngeal Phase Impairments: Multiple swallows       Luanna Salk, [facility] SLP [phone]

## 2013-11-02 NOTE — Progress Notes (Signed)
CRITICAL VALUE ALERT  Critical value received:  Sodium 166; Chloride >130  Date of notification:  11/02/13  Time of notification:  7473  Critical value read back:Yes.    Nurse who received alert:  Aldean Baker, RN  MD notified (1st page):  Rama  Time of first page:  1712  MD notified (2nd page):  Time of second page:  Responding MD:  Rama  Time MD responded:  Values improving from previous. No new orders

## 2013-11-02 NOTE — Care Management Note (Signed)
CARE MANAGEMENT NOTE 11/02/2013  Patient:  [name], [name]   Account Number:  [id number]  Date Initiated:  11/02/2013  Documentation initiated by:  Marney Doctor  Subjective/Objective Assessment:   79 yo admitted with metabolic encephalopathy with PMH of dementia, hypothyroidism, diabetes insipidus, bipolar 1 disorder, and diabetes     Action/Plan:   From [facility]   Anticipated DC Date:  11/06/2013   Anticipated DC Plan:  [place]  CM consult      Choice offered to / List presented to:             Status of service:  In process, will continue to follow Medicare Important Message given?   (If response is "NO", the following Medicare IM given date fields will be blank) Date Medicare IM given:   Medicare IM given by:   Date Additional Medicare IM given:   Additional Medicare IM given by:    Discharge Disposition:    Per UR Regulation:  Reviewed for med. necessity/level of care/duration of stay  If discussed at [place] of Stay Meetings, dates discussed:    Comments:  11/02/13 Marney Doctor RN,BSN,NCM [phone] Chart reviewed and CM following for DC needs.

## 2013-11-02 NOTE — Progress Notes (Signed)
CRITICAL VALUE ALERT  Critical value received: Na 176, Chloride >130  Date of notification: 11/01/13  Time of notification:  2250 Critical value read back:yes  Nurse who received alert:  Azzie Glatter, RN  MD notified (1st page):  Schorr  Time of first page: 2230  MD notified (2nd page): n/a  Time of second page:n/a  Responding MD:  n/a (MD was sent text page with information; labs are consistent with prev. Values; no orders received).  Time MD responded:  n/a

## 2013-11-02 NOTE — Progress Notes (Signed)
CRITICAL VALUE ALERT  Critical value received:  Sodium 170, Chloride > 130  Date of notification:  11/02/13   Time of notification:  0802   Critical value read back:Yes.    Nurse who received alert:  Jena Gauss  MD notified (1st page):  Catha Brow  Time of first page: 69  MD notified (2nd page):  Time of second page:  Responding MD:    Time MD responded:

## 2013-11-03 DIAGNOSIS — F319 Bipolar disorder, unspecified: Secondary | ICD-10-CM

## 2013-11-03 DIAGNOSIS — G309 Alzheimer's disease, unspecified: Secondary | ICD-10-CM

## 2013-11-03 LAB — GLUCOSE, CAPILLARY
Glucose-Capillary: 111 mg/dL — ABNORMAL HIGH (ref 70–99)
Glucose-Capillary: 114 mg/dL — ABNORMAL HIGH (ref 70–99)
Glucose-Capillary: 115 mg/dL — ABNORMAL HIGH (ref 70–99)
Glucose-Capillary: 116 mg/dL — ABNORMAL HIGH (ref 70–99)
Glucose-Capillary: 134 mg/dL — ABNORMAL HIGH (ref 70–99)
Glucose-Capillary: 97 mg/dL (ref 70–99)

## 2013-11-03 LAB — CBC
HEMATOCRIT: 39 % (ref 36.0–46.0)
Hemoglobin: 11.4 g/dL — ABNORMAL LOW (ref 12.0–15.0)
MCH: 29.9 pg (ref 26.0–34.0)
MCHC: 29.2 g/dL — AB (ref 30.0–36.0)
MCV: 102.4 fL — AB (ref 78.0–100.0)
PLATELETS: 139 10*3/uL — AB (ref 150–400)
RBC: 3.81 MIL/uL — ABNORMAL LOW (ref 3.87–5.11)
RDW: 15 % (ref 11.5–15.5)
WBC: 6.5 10*3/uL (ref 4.0–10.5)

## 2013-11-03 LAB — BASIC METABOLIC PANEL
Anion gap: 10 (ref 5–15)
BUN: 38 mg/dL — ABNORMAL HIGH (ref 6–23)
CO2: 26 meq/L (ref 19–32)
CREATININE: 1.28 mg/dL — AB (ref 0.50–1.10)
Calcium: 8.4 mg/dL (ref 8.4–10.5)
Chloride: 123 mEq/L — ABNORMAL HIGH (ref 96–112)
GFR calc Af Amer: 46 mL/min — ABNORMAL LOW (ref >90)
GFR, EST NON AFRICAN AMERICAN: 39 mL/min — AB (ref >90)
Glucose, Bld: 118 mg/dL — ABNORMAL HIGH (ref 70–99)
Potassium: 3.2 mEq/L — ABNORMAL LOW (ref 3.7–5.3)
Sodium: 159 mEq/L — ABNORMAL HIGH (ref 137–147)

## 2013-11-03 MED ORDER — POTASSIUM CHLORIDE CRYS ER 20 MEQ PO TBCR
40.0000 meq | EXTENDED_RELEASE_TABLET | Freq: Once | ORAL | Status: AC
  Administered 2013-11-03: 40 meq via ORAL
  Filled 2013-11-03: qty 2

## 2013-11-03 MED ORDER — LEVOTHYROXINE SODIUM 75 MCG PO TABS
75.0000 ug | ORAL_TABLET | Freq: Every day | ORAL | Status: DC
  Administered 2013-11-04 – 2013-11-06 (×3): 75 ug via ORAL
  Filled 2013-11-03 (×4): qty 1

## 2013-11-03 MED ORDER — PALIPERIDONE ER 1.5 MG PO TB24
1.5000 mg | ORAL_TABLET | Freq: Every day | ORAL | Status: DC
  Administered 2013-11-03 – 2013-11-05 (×3): 1.5 mg via ORAL
  Filled 2013-11-03: qty 1

## 2013-11-03 NOTE — Progress Notes (Signed)
Clinical Social Work Department BRIEF PSYCHOSOCIAL ASSESSMENT 11/03/2013  Patient:  [name], [name]     Account Number:  [id number]     Admit date:  11/01/2013  Clinical Social Worker:  Ulyess Blossom  Date/Time:  11/03/2013 03:00 PM  Referred by:  Physician  Date Referred:  11/02/2013 Referred for  SNF Placement   Other Referral:   Interview type:  Family Other interview type:    PSYCHOSOCIAL DATA Living Status:  FACILITY Admitted from facility:   Level of care:   Primary support name:  [name]/[phone] Primary support relationship to patient:  CHILD, ADULT Degree of support available:   strong    CURRENT CONCERNS Current Concerns  Post-Acute Placement   Other Concerns:    SOCIAL WORK ASSESSMENT / PLAN CSW received referral that pt admitted from [facility].    CSW reviewed chart and noted that pt disoriented x 4. CSW contacted pt daughter, [name] via telephone. CSW introduced self and explained role. Pt daughter discussed that pt is a long term resident at [facility]. Pt daughter shared that pt has been a resident there for 6 years. CSW inquired with pt daughter if pt daughter plans for pt to return to [facility] upon discharge. Per pt daughter, pt daugher interested in exploring other options. CSW provided supportive listening as pt daughter discussed that pt is on a waiting list at [facility], but pt daughter is open to exploring all options in [facility]. CSW discussed with pt daughter that Medicaid beds are often limited and discussed that pt would have to return to [facility] if no other options available. Pt daughter expressed understanding.    CSW completed FL2 and initiated SNF search to [place] at this time.    CSW to follow up with pt daughter re: SNF bed offers for long term care.    CSW to continue to follow to provide  support and assist with pt discharge planning needs.   Assessment/plan status:  Psychosocial Support/Ongoing Assessment of Needs Other assessment/ plan:   discharge planning   Information/referral to community resources:   [facility] list    PATIENT'S/FAMILY'S RESPONSE TO PLAN OF CARE: Pt disoriented x 4. Pt long term resident at [facility]. Pt daughter has not been completely satisfied with the care at facility and is interested in exploring other options. Pt daughter aware that medicaid beds are limited. Pt daughter appreciative of CSW support and assistance.   [name], MSW, [place] Work [phone]

## 2013-11-03 NOTE — Progress Notes (Addendum)
Patient ID: [name], female   DOB: 08-19-1934, 79 y.o.   MRN: [id number] [facility] PROGRESS NOTE  [name] DGL:[id number] DOB: [birth year] DOA: 11/01/2013 PCP: Hollace Kinnier, DO  Brief narrative: 79 y.o. female the PMH of dementia, hypothyroidism, diabetes insipidus, bipolar 1 disorder, and diabetes who was brought to the hospital from her SNF with altered mental status. Upon initial evaluation in the ED, the patient was found to be severely hypernatremic. Her mental status has improved with slow correction of her sodium.   Assessment/Plan:   Principal Problem:  Metabolic encephalopathy secondary to hypernatremia   Vasopressin on hold (was being treated for diabetes insipidus).   Continue current IV fluids: D5% water at 125 cc/hr. Sodium level slowly improving. In past 24 hours: 166 --> 159. Active Problems:  Severe protein calorie malnutrition   Dysphagia 3 diet per nutritionist. 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   CBG's: 116, 114 Essential hypertension, benign    Hydralazine PRN ordered for SBP > 518 or diastolic BP > 841. Diabetes insipidus - lithium-induced   Hold vasopressin until PO intake better. Dyslipidemia   Resume lipitor. Hypothyroidism   TSH 0.048, free T4 normal at 1.04. Reduce synthroid from 125 mcg to 75 mcg. Sacral decubitus ulcer, stage II   Appreciate wound care recommendations.  Bipolar 1 disorder, depressed   Resume Invega and Paxil. Resume Trazodone.  CKD stage 3  Baseline creatinine 1.28. Current creatinine in baseline range.  Hypokalemia  Supplemented. Check BMP in am. Check Mg in am. Anemia of chronic disease  Secondary to history of CKD.  Hemoglobin is 11.4. No current indications for transfusion.  Alzheimer's disease   From SNF. DVT prophylaxis   Lovenox stopped due to risk of bleeding. Order placed for SCD's bilaterally.    Code  Status: Full.  Family Communication: Bary Richard (daughter) [phone], family not at the bedside this am.  Disposition Plan: SNF when stable.    IV Access:   Peripheral IV Procedures and diagnostic studies:   Dg Chest Portable 1 View 11/01/2013: No evidence of acute cardiopulmonary disease.  Medical Consultants:   None. Anti-Infectives:   None.  Leisa Lenz, MD  [facility] Pager [phone]  If 7PM-7AM, please contact night-coverage [email or web address] Password [email or web address] 11/03/2013, 2:09 PM   LOS: 2 days    HPI/Subjective: No acute overnight events.  Objective: Filed Vitals:   11/02/13 0540 11/02/13 1346 11/02/13 2041 11/03/13 0415  BP: 127/92 124/59 108/50 105/55  Pulse: 70 111 63 84  Temp: 97.8 F (36.6 C) 98.2 F (36.8 C) 97.9 F (36.6 C) 97.9 F (36.6 C)  TempSrc: Axillary Axillary Oral Oral  Resp: 16 16 15 16   Height:      Weight:      SpO2: 98% 99% 99% 98%    Intake/Output Summary (Last 24 hours) at 11/03/13 1409 Last data filed at 11/03/13 0900  Gross per 24 hour  Intake   2050 ml  Output      0 ml  Net   2050 ml    Exam:   General:  Pt is sleeping, no distress  Cardiovascular: Regular rate and rhythm, S1/S2 appreciated   Respiratory: Clear to auscultation bilaterally, no wheezing, no crackles, no rhonchi  Abdomen:  non distended, bowel sounds present  Extremities:  pulses DP and PT palpable bilaterally  Neuro: non focal  Data Reviewed: Basic Metabolic Panel:  Recent Labs Lab 11/01/13 2155 11/02/13 0405 11/02/13 0955 11/02/13  1617 11/03/13 0520  NA 176* 175* 170* 166* 159*  K 4.5 3.8 3.9 3.5* 3.2*  CL >130* >130* >130* >130* 123*  CO2 28 26 25 27 26   GLUCOSE 98 112* 99 175* 118*  BUN 57* 55* 54* 48* 38*  CREATININE 1.76* 1.53* 1.56* 1.42* 1.28*  CALCIUM 9.3 9.1 8.8 8.8 8.4   Liver Function Tests:  Recent Labs Lab 11/01/13 1605  AST 15  ALT 7  ALKPHOS 96  BILITOT 0.5  PROT 7.9  ALBUMIN 3.5   No results found for  this basename: LIPASE, AMYLASE,  in the last 168 hours No results found for this basename: AMMONIA,  in the last 168 hours CBC:  Recent Labs Lab 11/01/13 1504 11/02/13 0405 11/03/13 0520  WBC 9.1 8.7 6.5  HGB 15.5* 13.2 11.4*  HCT 53.3* 46.0 39.0  MCV 104.7* 104.3* 102.4*  PLT 188 163 139*   Cardiac Enzymes:  Recent Labs Lab 11/01/13 1500  TROPONINI <0.30   BNP: No components found with this basename: POCBNP,  CBG:  Recent Labs Lab 11/02/13 2040 11/02/13 2355 11/03/13 0411 11/03/13 0746 11/03/13 1146  GLUCAP 189* 129* 97 114* 116*    Recent Results (from the past 240 hour(s))  MRSA PCR SCREENING     Status: Abnormal   Collection Time    11/01/13 10:55 PM      Result Value Ref Range Status   MRSA by PCR INVALID RESULTS, SPECIMEN SENT FOR CULTURE (*) NEGATIVE Final   Comment: RESULT CALLED TO, READ BACK BY AND VERIFIED WITH:     ACQUAAH,E RN AT 0103 10.26.15 BY TIBBITTS,K  MRSA CULTURE     Status: None   Collection Time    11/01/13 10:55 PM      Result Value Ref Range Status   Specimen Description NOSE   Final   Special Requests NONE   Final   Culture     Final   Value: NO SUSPICIOUS COLONIES, CONTINUING TO HOLD     Performed at [facility]   Report Status PENDING   Incomplete  MRSA PCR SCREENING     Status: None   Collection Time    11/02/13  5:05 PM      Result Value Ref Range Status   MRSA by PCR NEGATIVE  NEGATIVE Final     Scheduled Meds: . atorvastatin  10 mg Oral QHS  . feeding supplement (ENSURE COMPLETE)  237 mL Oral BID BM  . insulin aspart  0-9 Units Subcutaneous 6 times per day  . latanoprost  1 drop Both Eyes QHS  . levothyroxine  125 mcg Oral QAC breakfast  . Paliperidone  1.5 mg Oral QHS  . PARoxetine  20 mg Oral Daily   Continuous Infusions: . dextrose 125 mL/hr at 11/03/13 [phone]

## 2013-11-03 NOTE — Progress Notes (Signed)
Speech Language Pathology Treatment: Dysphagia  Patient Details Name: [name] MRN: [id number] DOB: 1934/12/08 Today's Date: 11/03/2013 Time: 2633-3545 SLP Time Calculation (min): 11 min  Assessment / Plan / Recommendation Clinical Impression  Pt with poor intake - likely due to mental status.  Upon SLP entrance to room, pt woke and stated she would try po intake.  Oral suction set up due to dried secretions retained in pt's mouth= secretions improved with oral moisture.    Pt did accept a few boluses of ice cream and straw sips of water without s/s of aspiration nor severe dysphagia.  Mental status appears to be largest barrier for pt consumption of adequate amount of po at this time. Recommend continue diet feeding pt only when fully alert.  RN and nurse tech report pt not adequately alert for intake today.  No family present at this time, will follow up for family education/pt tolerance/diet modifier if indicated.     HPI HPI: 79 yo female adm to [facility] from SNF with AMS, poor intake x several days - found to have elevated sodium.  Pt has h/o dementia and has been wheelchair bound for six months per MD note.  EGD completed in 2013 with findings of small Zenker's diverticulum and hiatal hernia.  CXR negative.  Family requested SLP swallow evaluation.     Pertinent Vitals Pain Assessment:  (no s/s of pain)  SLP Plan  Continue with current plan of care    Recommendations Diet recommendations: Dysphagia 3 (mechanical soft);Thin liquid (only when fully alert) Liquids provided via: Cup;Straw Medication Administration: Whole meds with puree Supervision: Full supervision/cueing for compensatory strategies Compensations: Slow rate;Small sips/bites (oral suction prn) Postural Changes and/or Swallow Maneuvers: Seated upright 90 degrees;Upright 30-60 min after meal              Oral Care Recommendations: Oral care BID Follow up Recommendations: Skilled Nursing facility (short term for  tolerance) Plan: Continue with current plan of care    [place], [place], [place] [facility] SLP [phone]

## 2013-11-03 NOTE — Progress Notes (Addendum)
Clinical Social Work Department CLINICAL SOCIAL WORK PLACEMENT NOTE 11/03/2013  Patient:  [name], [name]  Account Number:  [id number] Admit date:  11/01/2013  Clinical Social Worker:  Ulyess Blossom  Date/time:  11/03/2013 12:36 PM  Clinical Social Work is seeking post-discharge placement for this patient at the following level of care:   SKILLED NURSING   (*CSW will update this form in Epic as items are completed)   11/02/2013  Patient/family provided with [place] Department of Clinical Social Work's list of facilities offering this level of care within the geographic area requested by the patient (or if unable, by the patient's family).  11/02/2013  Patient/family informed of their freedom to choose among providers that offer the needed level of care, that participate in Medicare, Medicaid or managed care program needed by the patient, have an available bed and are willing to accept the patient.  11/02/2013  Patient/family informed of [facility]' ownership interest in [facility], as well as of the fact that they are under no obligation to receive care at this facility.  PASARR submitted to EDS on 11/02/2013 PASARR number received on 11/02/2013  FL2 transmitted to all facilities in geographic area requested by pt/family on  11/02/2013 FL2 transmitted to all facilities within larger geographic area on   Patient informed that his/her managed care company has contracts with or will negotiate with  certain facilities, including the following:     Patient/family informed of bed offers received:  11/03/2013 Patient chooses bed at [facility] and [place] recommends and patient chooses bed at    Patient to be transferred to  on   Patient to be transferred to facility by  Patient and family notified of transfer on  Name of family member notified:    The following physician request were entered in Epic:   Additional Comments:   Alison Murray, MSW,  [place] Work [phone]

## 2013-11-04 ENCOUNTER — Inpatient Hospital Stay (HOSPITAL_COMMUNITY): Payer: Medicare Other

## 2013-11-04 LAB — BASIC METABOLIC PANEL
ANION GAP: 14 (ref 5–15)
BUN: 29 mg/dL — ABNORMAL HIGH (ref 6–23)
CHLORIDE: 115 meq/L — AB (ref 96–112)
CO2: 22 meq/L (ref 19–32)
CREATININE: 1.17 mg/dL — AB (ref 0.50–1.10)
Calcium: 8.5 mg/dL (ref 8.4–10.5)
GFR calc Af Amer: 51 mL/min — ABNORMAL LOW (ref >90)
GFR calc non Af Amer: 44 mL/min — ABNORMAL LOW (ref >90)
GLUCOSE: 171 mg/dL — AB (ref 70–99)
Potassium: 3.4 mEq/L — ABNORMAL LOW (ref 3.7–5.3)
Sodium: 151 mEq/L — ABNORMAL HIGH (ref 137–147)

## 2013-11-04 LAB — GLUCOSE, CAPILLARY
GLUCOSE-CAPILLARY: 125 mg/dL — AB (ref 70–99)
GLUCOSE-CAPILLARY: 149 mg/dL — AB (ref 70–99)
GLUCOSE-CAPILLARY: 172 mg/dL — AB (ref 70–99)
Glucose-Capillary: 148 mg/dL — ABNORMAL HIGH (ref 70–99)
Glucose-Capillary: 136 mg/dL — ABNORMAL HIGH (ref 70–99)
Glucose-Capillary: 136 mg/dL — ABNORMAL HIGH (ref 70–99)

## 2013-11-04 LAB — MRSA CULTURE

## 2013-11-04 LAB — CBC
HCT: 42.8 % (ref 36.0–46.0)
Hemoglobin: 12.7 g/dL (ref 12.0–15.0)
MCH: 29.1 pg (ref 26.0–34.0)
MCHC: 29.7 g/dL — ABNORMAL LOW (ref 30.0–36.0)
MCV: 98.2 fL (ref 78.0–100.0)
Platelets: 137 10*3/uL — ABNORMAL LOW (ref 150–400)
RBC: 4.36 MIL/uL (ref 3.87–5.11)
RDW: 14.2 % (ref 11.5–15.5)
WBC: 7.7 10*3/uL (ref 4.0–10.5)

## 2013-11-04 LAB — MAGNESIUM: Magnesium: 2.3 mg/dL (ref 1.5–2.5)

## 2013-11-04 MED ORDER — DEXTROSE 5 % IV SOLN
100.0000 mg | Freq: Two times a day (BID) | INTRAVENOUS | Status: DC
  Administered 2013-11-04 – 2013-11-06 (×5): 100 mg via INTRAVENOUS
  Filled 2013-11-04 (×5): qty 100

## 2013-11-04 MED ORDER — POTASSIUM CHLORIDE 10 MEQ/100ML IV SOLN
10.0000 meq | INTRAVENOUS | Status: AC
  Administered 2013-11-04 (×3): 10 meq via INTRAVENOUS
  Filled 2013-11-04 (×3): qty 100

## 2013-11-04 MED ORDER — PROMETHAZINE HCL 25 MG/ML IJ SOLN
12.5000 mg | Freq: Four times a day (QID) | INTRAMUSCULAR | Status: DC | PRN
  Administered 2013-11-04: 12.5 mg via INTRAVENOUS
  Filled 2013-11-04: qty 1

## 2013-11-04 MED ORDER — ONDANSETRON HCL 4 MG/2ML IJ SOLN
4.0000 mg | Freq: Four times a day (QID) | INTRAMUSCULAR | Status: DC | PRN
  Administered 2013-11-04: 4 mg via INTRAVENOUS
  Filled 2013-11-04: qty 2

## 2013-11-04 MED ORDER — ONDANSETRON HCL 4 MG/2ML IJ SOLN: 4.0000 mg | Freq: Four times a day (QID) | INTRAMUSCULAR | Status: DC

## 2013-11-04 NOTE — Progress Notes (Signed)
Pt. Was found to have a large quarter sized serous filled blister on the inside of her left upper arm.  The surrounding skin was found to be red, swollen, and warm to the touch.  MD was notified and new orders were given.  Will continue to monitor.

## 2013-11-04 NOTE — Progress Notes (Signed)
CSW continuing to follow.  CSW received phone call from pt daughter, [name] this morning. Pt daughter reports that she plans to go tour facilities that offered a bed today in order to make decision regarding it pt will return to [facility] for pt long term care needs. Pt daughter states that she will notify this CSW of decision by the end of the day.   Per MD, pt not yet medically ready today, but anticipate discharge tomorrow. CSW notified pt daughter.   CSW to continue to follow to provide support and assist with pt discharge planning needs.   Alison Murray, MSW, [place] Work [phone]

## 2013-11-04 NOTE — Progress Notes (Signed)
NUTRITION FOLLOW UP  Intervention:    Dysphagia 3 thin liquid with extra sauce and gravy Ensure Complete po BID, each supplement provides 350 kcal and 13 grams of protein Feed patient when fully awake and alert RD to follow  Nutrition Dx:   Inadequate oral intake related to inability to eat as evidenced by npo status-resolved  New Diagnosis:  Inadequate oral intake related to mental status AEB documentation.   Goal:   Tolerate diet advancement with intake of meals and supplements to meet >90% estimated needs.   Monitor:   Intake, labs, weight trend  Assessment:   Patient continues with decreased intake related to mental status.  Did wake up on visit from SLP and stated that she would try to eat.  Agree mental status appears to be the largest barrier for pt consumption of adequate amount of po at this time.  Hx advanced dementia and bipolar disorder.  Sodium 151 today, high but much improved.  Height: Ht Readings from Last 1 Encounters:  11/01/13 5\' 4"  (1.626 m)    Weight Status:   Wt Readings from Last 1 Encounters:  11/01/13 122 lb 3.2 oz (55.43 kg)    Re-estimated needs:  Kcal: 1400-1600 Protein: 70-80 gm Fluid: >1.4L daily  Skin: stage 2 decubitus  Diet Order: Dysphagia 3 thin with extra sauce/gravy   Intake/Output Summary (Last 24 hours) at 11/04/13 0919 Last data filed at 11/04/13 0855  Gross per 24 hour  Intake   1975 ml  Output      0 ml  Net   1975 ml    Labs:   Recent Labs Lab 11/02/13 1617 11/03/13 0520 11/04/13 0505  NA 166* 159* 151*  K 3.5* 3.2* 3.4*  CL >130* 123* 115*  CO2 27 26 22   BUN 48* 38* 29*  CREATININE 1.42* 1.28* 1.17*  CALCIUM 8.8 8.4 8.5  MG  --   --  2.3  GLUCOSE 175* 118* 171*    CBG (last 3)   Recent Labs  11/03/13 2355 11/04/13 0412 11/04/13 0805  GLUCAP 134* 148* 149*    Scheduled Meds: . antiseptic oral rinse  7 mL Mouth Rinse q12n4p  . atorvastatin  10 mg Oral QHS  . chlorhexidine  15 mL Mouth Rinse  BID  . feeding supplement (ENSURE COMPLETE)  237 mL Oral BID BM  . insulin aspart  0-9 Units Subcutaneous 6 times per day  . latanoprost  1 drop Both Eyes QHS  . levothyroxine  75 mcg Oral QAC breakfast  . Paliperidone  1.5 mg Oral QHS  . PARoxetine  20 mg Oral Daily  . potassium chloride  10 mEq Intravenous Q1 Hr x 3    Continuous Infusions: . dextrose 125 mL/hr at 11/04/13 0259    Antonieta Iba, RD, LDN Clinical Inpatient Dietitian Pager:  [phone] Weekend and after hours pager:  [phone]

## 2013-11-04 NOTE — Progress Notes (Signed)
Pt. Has vomited brown colored emesis twice. MD notified who came and assessed pt. New orders were placed. Will continue to monitor.

## 2013-11-04 NOTE — Progress Notes (Signed)
Patient ID: [name], female   DOB: 04-Dec-1934, 79 y.o.   MRN: [id number] [facility] PROGRESS NOTE  [name] OJJ:[id number] DOB: 07-23-1934 DOA: 11/01/2013 PCP: Hollace Kinnier, DO  Brief narrative: 79 y.o. female the PMH of dementia, hypothyroidism, diabetes insipidus, bipolar 1 disorder, and diabetes who was brought to the hospital from her SNF with altered mental status. Upon initial evaluation in the ED, the patient was found to be severely hypernatremic. Her mental status has improved with slow correction of her sodium.   Assessment/Plan:   Principal Problem:  Metabolic encephalopathy secondary to hypernatremia  Vasopressin on hold (was being treated for diabetes insipidus).  Continue current IV fluids D5% water at 125 cc/hr. Sodium level continues to improve. In past 48 hours: 166 --> 159 --> 151.   Active Problems:  Severe protein calorie malnutrition  Dysphagia 3 diet per nutritionist. 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  CBG's: 149, 148 Essential hypertension, benign  BP 126/58. At goal.  Diabetes insipidus - lithium-induced  Hold vasopressin until PO intake better. Dyslipidemia  Resumed lipitor. Hypothyroidism  TSH 0.048, free T4 normal at 1.04. Reduced synthroid from 125 mcg to 75 mcg. Sacral decubitus ulcer, stage II   Appreciate wound care recommendations. Pressure ulcer at sacrum: 5cm x 6.5cm with no depth. Presentation consistent with suspected deep tissue injury. Dressing recommendations: turning and repositioning to limit positioning in the supine position where sacrum and heels are at greatest risk for pressure ulceration. Pressure redistribution bilateral heel boots provided. A soft silicone foam dressing to be placed over the affected area at the sacrum and changed twice weekly and PRN for soiling. Thanks, Bipolar 1 disorder, depressed  Continue Invega and Paxil. Resume  Trazodone.  CKD stage 3  Baseline creatinine 1.28. Current creatinine in baseline range.  Hypokalemia  Mg WNL. Potassium still little low at 3.4 this morning. Supplemented.  Anemia of chronic disease  Secondary to history of CKD.  Hemoglobin is 11.4 --> 12.7. No current indications for transfusion.  Alzheimer's disease  From SNF. DVT prophylaxis  Lovenox stopped due to risk of bleeding. Order placed for SCD's bilaterally.    Code Status: Full.  Family Communication: Bary Richard (daughter) [phone], family not at the bedside this am.  Disposition Plan: SNF when stable.   IV Access:   Peripheral IV Procedures and diagnostic studies:   Dg Chest Portable 1 View 11/01/2013: No evidence of acute cardiopulmonary disease.  Medical Consultants:   None. Anti-Infectives:   None.   Leisa Lenz, MD  [facility] Pager [phone]  If 7PM-7AM, please contact night-coverage [email or web address] Password [email or web address] 11/04/2013, 9:32 AM   LOS: 3 days    HPI/Subjective: No acute overnight events.  Objective: Filed Vitals:   11/03/13 0415 11/03/13 1410 11/03/13 2220 11/04/13 0519  BP: 105/55 103/51 123/66 126/58  Pulse: 84 83 73 72  Temp: 97.9 F (36.6 C) 97.9 F (36.6 C) 97.1 F (36.2 C) 98.8 F (37.1 C)  TempSrc: Oral Axillary Oral Oral  Resp: 16 14 16    Height:      Weight:      SpO2: 98% 98% 100% 100%    Intake/Output Summary (Last 24 hours) at 11/04/13 0932 Last data filed at 11/04/13 0855  Gross per 24 hour  Intake   1975 ml  Output      0 ml  Net   1975 ml    Exam:   General:  Pt is more alert this  am, no distress  Cardiovascular: Regular rate and rhythm, S1/S2 appreciated   Respiratory: Clear to auscultation bilaterally, no wheezing, no crackles  Abdomen: Non tender, non distended, bowel sounds present  Extremities: No edema, pulses DP and PT palpable bilaterally  Neuro: No focal deficits.  Data Reviewed: Basic Metabolic Panel:  Recent Labs Lab  11/02/13 0405 11/02/13 0955 11/02/13 1617 11/03/13 0520 11/04/13 0505  NA 175* 170* 166* 159* 151*  K 3.8 3.9 3.5* 3.2* 3.4*  CL >130* >130* >130* 123* 115*  CO2 26 25 27 26 22   GLUCOSE 112* 99 175* 118* 171*  BUN 55* 54* 48* 38* 29*  CREATININE 1.53* 1.56* 1.42* 1.28* 1.17*  CALCIUM 9.1 8.8 8.8 8.4 8.5  MG  --   --   --   --  2.3   Liver Function Tests:  Recent Labs Lab 11/01/13 1605  AST 15  ALT 7  ALKPHOS 96  BILITOT 0.5  PROT 7.9  ALBUMIN 3.5   No results found for this basename: LIPASE, AMYLASE,  in the last 168 hours No results found for this basename: AMMONIA,  in the last 168 hours CBC:  Recent Labs Lab 11/01/13 1504 11/02/13 0405 11/03/13 0520 11/04/13 0700  WBC 9.1 8.7 6.5 7.7  HGB 15.5* 13.2 11.4* 12.7  HCT 53.3* 46.0 39.0 42.8  MCV 104.7* 104.3* 102.4* 98.2  PLT 188 163 139* 137*   Cardiac Enzymes:  Recent Labs Lab 11/01/13 1500  TROPONINI <0.30   BNP: No components found with this basename: POCBNP,  CBG:  Recent Labs Lab 11/03/13 1559 11/03/13 1954 11/03/13 2355 11/04/13 0412 11/04/13 0805  GLUCAP 115* 111* 134* 148* 149*    Recent Results (from the past 240 hour(s))  MRSA PCR SCREENING     Status: Abnormal   Collection Time    11/01/13 10:55 PM      Result Value Ref Range Status   MRSA by PCR INVALID RESULTS, SPECIMEN SENT FOR CULTURE (*) NEGATIVE Final  MRSA CULTURE     Status: None   Collection Time    11/01/13 10:55 PM      Result Value Ref Range Status   Specimen Description NOSE   Final   Special Requests NONE   Final   Culture     Final   Value: NO STAPHYLOCOCCUS AUREUS ISOLATED     Note: NOMRSA     Performed at [facility]   Report Status 11/04/2013 FINAL   Final  MRSA PCR SCREENING     Status: None   Collection Time    11/02/13  5:05 PM      Result Value Ref Range Status   MRSA by PCR NEGATIVE  NEGATIVE Final     Scheduled Meds: . atorvastatin  10 mg Oral QHS  . feeding supplement (ENSURE  COMPLETE)  237 mL Oral BID BM  . insulin aspart  0-9 Units Subcutaneous 6 times per day  . latanoprost  1 drop Both Eyes QHS  . levothyroxine  75 mcg Oral QAC breakfast  . Paliperidone  1.5 mg Oral QHS  . PARoxetine  20 mg Oral Daily  . potassium chloride  10 mEq Intravenous Q1 Hr x 3   Continuous Infusions: . dextrose 125 mL/hr at 11/04/13 0259

## 2013-11-05 LAB — BASIC METABOLIC PANEL
ANION GAP: 17 — AB (ref 5–15)
BUN: 22 mg/dL (ref 6–23)
CHLORIDE: 117 meq/L — AB (ref 96–112)
CO2: 16 mEq/L — ABNORMAL LOW (ref 19–32)
Calcium: 9.1 mg/dL (ref 8.4–10.5)
Creatinine, Ser: 1.03 mg/dL (ref 0.50–1.10)
GFR calc Af Amer: 59 mL/min — ABNORMAL LOW (ref >90)
GFR, EST NON AFRICAN AMERICAN: 51 mL/min — AB (ref >90)
Glucose, Bld: 120 mg/dL — ABNORMAL HIGH (ref 70–99)
Potassium: 4.6 mEq/L (ref 3.7–5.3)
Sodium: 150 mEq/L — ABNORMAL HIGH (ref 137–147)

## 2013-11-05 LAB — GLUCOSE, CAPILLARY
GLUCOSE-CAPILLARY: 99 mg/dL (ref 70–99)
GLUCOSE-CAPILLARY: 125 mg/dL — AB (ref 70–99)
GLUCOSE-CAPILLARY: 125 mg/dL — AB (ref 70–99)
Glucose-Capillary: 117 mg/dL — ABNORMAL HIGH (ref 70–99)
Glucose-Capillary: 128 mg/dL — ABNORMAL HIGH (ref 70–99)

## 2013-11-05 MED ORDER — BISACODYL 10 MG RE SUPP: 10.0000 mg | Freq: Every day | RECTAL | Status: DC | PRN

## 2013-11-05 MED ORDER — DOCUSATE SODIUM 100 MG PO CAPS
100.0000 mg | ORAL_CAPSULE | Freq: Two times a day (BID) | ORAL | Status: DC
  Administered 2013-11-05 – 2013-11-06 (×2): 100 mg via ORAL
  Filled 2013-11-05 (×3): qty 1

## 2013-11-05 MED ORDER — POLYETHYLENE GLYCOL 3350 17 G PO PACK
17.0000 g | PACK | Freq: Every day | ORAL | Status: DC
  Administered 2013-11-05 – 2013-11-06 (×2): 17 g via ORAL
  Filled 2013-11-05 (×2): qty 1

## 2013-11-05 MED ORDER — BISACODYL 10 MG RE SUPP: 10.0000 mg | Freq: Once | RECTAL | Status: DC

## 2013-11-05 MED ORDER — POLYETHYLENE GLYCOL 3350 17 G PO PACK
17.0000 g | PACK | Freq: Two times a day (BID) | ORAL | Status: DC
  Filled 2013-11-05: qty 1

## 2013-11-05 NOTE — Progress Notes (Signed)
Patient ID: [name], female   DOB: 11-Sep-1934, 79 y.o.   MRN: [id number] [facility] PROGRESS NOTE  Taimane Stimmel WCB:[id number] DOB: 02-Apr-1934 DOA: 11/01/2013 PCP: Hollace Kinnier, DO  Brief narrative: 79 y.o. female the PMH of dementia, hypothyroidism, diabetes insipidus, bipolar 1 disorder, and diabetes who was brought to the hospital from her SNF with altered mental status. Upon initial evaluation in the ED, the patient was found to be severely hypernatremic. Her mental status has improved with slow correction of her sodium but she still remains disoriented. Hospital course complicated with acute onset nausea/ vomiting. ABD x ray showed constipation but no obstruction.  Assessment/Plan:   Principal Problem:  Metabolic encephalopathy secondary to hypernatremia  Vasopressin still on hold (was being treated for diabetes insipidus).  We will continue current IV fluids D5% water but rate reduced from 125 to 50 cc/hr. Sodium level continues to improve. In past 48 hours: 166 --> 159 --> 151 --> 150. Active Problems:  Severe protein calorie malnutrition  Dysphagia 3 diet per nutritionist. Had nausea and vomiting which now resolved. Abd x ray 11/04/2013 showed no obstruction. Constipation  As seen on abd x ray 11/04/2013. Add colace, miralax, bisacodyl. 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  Essential hypertension, benign  BP at goal. Diabetes insipidus - lithium-induced  Hold vasopressin until PO intake better. Continue SSI. Dyslipidemia  Resumed lipitor. Hypothyroidism  TSH 0.048, free T4 normal at 1.04. Reduced synthroid from 125 mcg to 75 mcg. Sacral decubitus ulcer, stage II   Appreciate wound care recommendations. Pressure ulcer at sacrum: 5cm x 6.5cm with no depth. Presentation consistent with suspected deep tissue injury. Dressing recommendations: turning and repositioning to limit positioning  in the supine position where sacrum and heels are at greatest risk for pressure ulceration. Pressure redistribution bilateral heel boots provided. A soft silicone foam dressing to be placed over the affected area at the sacrum and changed twice weekly and PRN for soiling. Thanks, Bipolar 1 disorder, depressed  Continue Invega and Paxil. Resume Trazodone.  CKD stage 3  Baseline creatinine 1.28. Current creatinine WNL. Hypokalemia  Mg WNL. Potassium supplemented and WNL this morning.  Anemia of chronic disease  Secondary to history of CKD.  Hemoglobin is 11.4 --> 12.7. No current indications for transfusion.  Alzheimer's disease  From SNF. DVT prophylaxis  Lovenox stopped due to risk of bleeding. Continue SCD's bilaterally.    Code Status: Full.  Family Communication: Bary Richard (daughter) [phone], family not at the bedside this am.  Disposition Plan: SNF when stable.   IV Access:   Peripheral IV Procedures and diagnostic studies:    Dg Chest Portable 1 View 11/01/2013: No evidence of acute cardiopulmonary disease.   Dg Abd Portable 1v 11/04/2013  Constipation with fecal impaction.   Electronically Signed   By: Rozetta Nunnery M.D.   On: 11/04/2013 14:06   Medical Consultants:   None. Anti-Infectives:   None.  Leisa Lenz, MD  [facility] Pager [phone]  If 7PM-7AM, please contact night-coverage [email or web address] Password [email or web address] 11/05/2013, 2:30 PM   LOS: 4 days    HPI/Subjective: No acute overnight events.  Objective: Filed Vitals:   11/04/13 0519 11/04/13 1346 11/04/13 2145 11/05/13 0437  BP: 126/58 112/71 109/69 121/70  Pulse: 72 74 70 68  Temp: 98.8 F (37.1 C) 99.5 F (37.5 C) 98.9 F (37.2 C) 98.1 F (36.7 C)  TempSrc: Oral Oral Oral Oral  Resp:  16 14 14  Height:      Weight:      SpO2: 100% 100% 100% 100%    Intake/Output Summary (Last 24 hours) at 11/05/13 1430 Last data filed at 11/05/13 1057  Gross per 24 hour  Intake    845 ml  Output       0 ml  Net    845 ml    Exam:   General:  Pt is alert, no distress  Cardiovascular: Regular rate and rhythm, S1/S2 appreciated   Respiratory: Clear to auscultation bilaterally, no wheezing, no crackles, no rhonchi  Abdomen: non-tender, non distended, bowel sounds present  Extremities: Pulses DP and PT palpable bilaterally  Neuro: Grossly nonfocal  Data Reviewed: Basic Metabolic Panel:  Recent Labs Lab 11/02/13 0955 11/02/13 1617 11/03/13 0520 11/04/13 0505 11/05/13 0650  NA 170* 166* 159* 151* 150*  K 3.9 3.5* 3.2* 3.4* 4.6  CL >130* >130* 123* 115* 117*  CO2 25 27 26 22  16*  GLUCOSE 99 175* 118* 171* 120*  BUN 54* 48* 38* 29* 22  CREATININE 1.56* 1.42* 1.28* 1.17* 1.03  CALCIUM 8.8 8.8 8.4 8.5 9.1  MG  --   --   --  2.3  --    Liver Function Tests:  Recent Labs Lab 11/01/13 1605  AST 15  ALT 7  ALKPHOS 96  BILITOT 0.5  PROT 7.9  ALBUMIN 3.5   No results found for this basename: LIPASE, AMYLASE,  in the last 168 hours No results found for this basename: AMMONIA,  in the last 168 hours CBC:  Recent Labs Lab 11/01/13 1504 11/02/13 0405 11/03/13 0520 11/04/13 0700  WBC 9.1 8.7 6.5 7.7  HGB 15.5* 13.2 11.4* 12.7  HCT 53.3* 46.0 39.0 42.8  MCV 104.7* 104.3* 102.4* 98.2  PLT 188 163 139* 137*   Cardiac Enzymes:  Recent Labs Lab 11/01/13 1500  TROPONINI <0.30   BNP: No components found with this basename: POCBNP,  CBG:  Recent Labs Lab 11/04/13 1959 11/05/13 11/05/13 0440 11/05/13 0811 11/05/13 1247  GLUCAP 136* 125* 99 117* 128*    MRSA PCR SCREENING     Status: Abnormal   Collection Time    11/01/13 10:55 PM      Result Value Ref Range Status   MRSA by PCR INVALID RESULTS, SPECIMEN SENT FOR CULTURE (*) NEGATIVE Final   Comment: RESULT CALLED TO, READ BACK BY AND VERIFIED WITH:     ACQUAAH,E RN AT 0103 10.26.15 BY TIBBITTS,K  MRSA CULTURE     Status: None   Collection Time    11/01/13 10:55 PM      Result Value Ref Range  Status   Specimen Description NOSE   Final   Special Requests NONE   Final   Culture     Final   Value: NO STAPHYLOCOCCUS AUREUS ISOLATED     Note: NOMRSA     Performed at [facility]   Report Status 11/04/2013 FINAL   Final  MRSA PCR SCREENING     Status: None   Collection Time    11/02/13  5:05 PM      Result Value Ref Range Status   MRSA by PCR NEGATIVE  NEGATIVE Final     Scheduled Meds: . atorvastatin  10 mg Oral QHS  . doxycycline (VIBRAMYCIN) IV  100 mg Intravenous Q12H  . feeding supplement (ENSURE COMPLETE)  237 mL Oral BID BM  . insulin aspart  0-9 Units Subcutaneous 6 times per day  . levothyroxine  75  mcg Oral QAC breakfast  . Paliperidone  1.5 mg Oral QHS  . PARoxetine  20 mg Oral Daily   Continuous Infusions: . dextrose 50 mL/hr at 11/05/13 1054

## 2013-11-05 NOTE — Progress Notes (Signed)
CSW continuing to follow for disposition planning.   Pt admitted from [facility], but pt daughter explored other options and has chosen bed at [facility] for long term care.   Per MD, pt not yet medically ready for discharge.  CSW updated pt daughter, [name] via telephone.  CSW notified [facility].  CSW to continue to follow to provide support and assist with pt discharge planning needs.   Alison Murray, MSW, [place] [facility] [phone]

## 2013-11-05 NOTE — Progress Notes (Signed)
Speech Language Pathology Treatment: Dysphagia  Patient Details Name: [name] MRN: [id number] DOB: [birth year] Today's Date: 11/05/2013 Time: 1455-1510 SLP Time Calculation (min): 15 min  Assessment / Plan / Recommendation Clinical Impression  Pt alert today during SLP visit.  RN, NT reported pt consumed ice cream today at lunch - overall intake remains poor.  She did feed herself a soft roll with jelly/butter with her left hand with slow but adequate mastication.  She benefited from cues to continue intake due to her decreased attention impacting adequacy of intake.  Suspect pt's dementia/poor attention will be an impeding factor in ability to meet nutritional needs.  SLP to sign off, as pt appears to be tolerating diet well when fully alert.  Spoke to RN with recommendations and posted signs to encourage intermittent supervision for pt initiation.     HPI HPI: 79 yo [facility]F with [facility]MS, poor intake x several days - found to have elevated sodium.  Pt has h/o dementia and has been wheelchair bound for six months per MD note.  EGD completed in 2013 with findings of small Zenker's diverticulum and hiatal hernia.  CXR negative.  Family requested SLP swallow evaluation.     Pertinent Vitals Pain Assessment: No/denies pain  SLP Plan  All goals met    Recommendations Diet recommendations: Dysphagia 3 (mechanical soft);Thin liquid (soft sandwiches, food pt can feed herself by hand) Liquids provided via: Cup;Straw Medication Administration: Whole meds with puree Supervision: Full supervision/cueing for compensatory strategies Compensations: Slow rate;Small sips/bites Postural Changes and/or Swallow Maneuvers: Seated upright 90 degrees;Upright 30-60 min after meal              Oral Care Recommendations: Oral care BID Follow up Recommendations: Skilled Nursing facility Plan: All goals met    [place], Irene, [facility] SLP [phone]

## 2013-11-06 DIAGNOSIS — N178 Other acute kidney failure: Secondary | ICD-10-CM

## 2013-11-06 LAB — GLUCOSE, CAPILLARY
GLUCOSE-CAPILLARY: 97 mg/dL (ref 70–99)
GLUCOSE-CAPILLARY: 130 mg/dL — AB (ref 70–99)
Glucose-Capillary: 100 mg/dL — ABNORMAL HIGH (ref 70–99)
Glucose-Capillary: 127 mg/dL — ABNORMAL HIGH (ref 70–99)
Glucose-Capillary: 93 mg/dL (ref 70–99)
Glucose-Capillary: 97 mg/dL (ref 70–99)

## 2013-11-06 LAB — BASIC METABOLIC PANEL
Anion gap: 15 (ref 5–15)
BUN: 16 mg/dL (ref 6–23)
CO2: 19 mEq/L (ref 19–32)
Calcium: 8.7 mg/dL (ref 8.4–10.5)
Chloride: 116 mEq/L — ABNORMAL HIGH (ref 96–112)
Creatinine, Ser: 1 mg/dL (ref 0.50–1.10)
GFR, EST AFRICAN AMERICAN: 61 mL/min — AB (ref >90)
GFR, EST NON AFRICAN AMERICAN: 53 mL/min — AB (ref >90)
Glucose, Bld: 114 mg/dL — ABNORMAL HIGH (ref 70–99)
Potassium: 4.9 mEq/L (ref 3.7–5.3)
SODIUM: 150 meq/L — AB (ref 137–147)

## 2013-11-06 MED ORDER — ENSURE COMPLETE PO LIQD: 237.0000 mL | Freq: Two times a day (BID) | ORAL | Status: AC

## 2013-11-06 MED ORDER — BISACODYL 10 MG RE SUPP: 10.0000 mg | Freq: Every day | RECTAL | Status: AC | PRN

## 2013-11-06 MED ORDER — DESMOPRESSIN ACETATE 0.1 MG PO TABS
0.4000 mg | ORAL_TABLET | Freq: Two times a day (BID) | ORAL | Status: DC
  Administered 2013-11-06: 0.4 mg via ORAL
  Filled 2013-11-06 (×2): qty 2

## 2013-11-06 MED ORDER — ACETAMINOPHEN 325 MG PO TABS
650.0000 mg | ORAL_TABLET | Freq: Four times a day (QID) | ORAL | Status: AC | PRN
Start: 2013-11-06 — End: ?

## 2013-11-06 MED ORDER — LEVOTHYROXINE SODIUM 75 MCG PO TABS: 75.0000 ug | ORAL_TABLET | Freq: Every day | ORAL | Status: AC

## 2013-11-06 MED ORDER — CETYLPYRIDINIUM CHLORIDE 0.05 % MT LIQD: 7.0000 mL | Freq: Two times a day (BID) | OROMUCOSAL | Status: AC

## 2013-11-06 MED ORDER — CHLORHEXIDINE GLUCONATE 0.12 % MT SOLN: 15.0000 mL | Freq: Two times a day (BID) | OROMUCOSAL | Status: AC

## 2013-11-06 MED ORDER — DESMOPRESSIN ACETATE 0.1 MG PO TABS
0.2000 mg | ORAL_TABLET | Freq: Every day | ORAL | Status: DC
  Filled 2013-11-06: qty 1

## 2013-11-06 MED ORDER — DOXYCYCLINE HYCLATE 100 MG PO TABS: 100.0000 mg | ORAL_TABLET | Freq: Two times a day (BID) | ORAL | Status: DC

## 2013-11-06 MED ORDER — TRAZODONE HCL 50 MG PO TABS: 25.0000 mg | ORAL_TABLET | Freq: Every evening | ORAL | Status: DC | PRN

## 2013-11-06 MED ORDER — POLYETHYLENE GLYCOL 3350 17 G PO PACK: 17.0000 g | PACK | Freq: Every day | ORAL | Status: AC

## 2013-11-06 NOTE — Discharge Summary (Addendum)
Physician Discharge Summary  [name] TIR:[id number] DOB: Aug 30, 1934 DOA: 11/01/2013  PCP: Hollace Kinnier, DO  Admit date: 11/01/2013 Discharge date: 11/06/2013  Recommendations for Outpatient Follow-up:  1. Please continue dressing changes over left arm. Patient with blister noted in hospital. Continue doxycycline 100 mg daily for 7 days and discharged for possible cellulitis. 2. Please note we decreased dose of Synthroid from 125 g to 75 g daily because thyroid hormone level was 0.05. Please recheck TSH in about 4 weeks after discharge. 3. Please continue vasopressin. Sodium at the time of discharge is 150. Continue to monitor sodium level at least every other day to make sure it improves to goal range of 135-145.   Left arm blister: Wound type: blister related to edema left inner upper arm  Wound bed: intact serous filled bulla  Drainage (amount, consistency, odor) none  Periwound: ecchymosis and erythema  Dressing procedure/placement/frequency:  Silicone foam initiated by the bedside staff and this is working well. This patient does have very frail, thin skin and as  Continue silicone foam, change every 3 days. Due next on Nov. 1st.  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  History of present illness:  79 y.o. female the PMH of dementia, hypothyroidism, diabetes insipidus,  bipolar 1 disorder, and diabetes who was brought to the hospital from her SNF with altered mental status. Upon initial evaluation in the ED, the patient was found to be severely hypernatremic. Her mental status has improved with slow correction of her sodium but she still remains disoriented. Hospital course complicated with acute onset nausea/ vomiting. ABD x ray showed constipation but no obstruction.   Assessment/Plan:   Principal Problem:  Metabolic encephalopathy secondary to hypernatremia  Sodium stable at 150. It has trended down slowly from 166 at the time of the admission down to 150 at the time of discharge. Resume vasopressin per previous nursing home dose. Monitor sodium level at least every other day to make sure your each range 135-145.  Active Problems:  Severe protein calorie malnutrition  Dysphagia 3 diet per nutritionist.  Had nausea and vomiting which now resolved. Abd x ray 11/04/2013 showed no obstruction. Constipation   As seen on abd x ray 11/04/2013. patient had one bowel movement with addition of Colace, Mira lax and bisacodyl. At the time of discharge we will resume senna, added Mira lax and bisacodyl if needed.  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  Essential hypertension, benign  BP at goal. Diabetes insipidus - lithium-induced  We will resume vasopressin per nursing home dose Dyslipidemia  Resumed lipitor. Hypothyroidism  TSH 0.048, free T4 normal at 1.04. Reduced synthroid from 125 mcg to 75 mcg. Sacral decubitus ulcer, stage II   Appreciate wound care recommendations. Pressure ulcer at sacrum: 5cm x 6.5cm with no depth. Presentation consistent with suspected deep tissue injury. Dressing recommendations: turning and repositioning to limit  positioning in the supine position where sacrum and heels are at greatest risk for pressure ulceration. Pressure redistribution bilateral heel  boots provided. A soft silicone foam dressing to be placed over the affected area at the sacrum and changed twice weekly and PRN for soiling. Thanks, Bipolar 1 disorder, depressed  Continue Invega and Paxil. Resume Trazodone.  CKD stage 3  Baseline creatinine 1.28. Current creatinine WNL. Hypokalemia  Mg WNL. Potassium supplemented and WNL this morning.  Anemia of chronic disease  Secondary to history of CKD.  Hemoglobin is 11.4 --> 12.7. No current indications for transfusion.  Alzheimer's disease  From SNF. DVT prophylaxis  Lovenox stopped due to risk of bleeding. Continue SCD's bilaterally.    Code Status: Full.  Family Communication: Bary Richard (daughter) [phone], family not at the bedside this am.    IV Access:   Peripheral IV Procedures and diagnostic studies:   Dg Chest Portable 1 View 11/01/2013: No evidence of acute cardiopulmonary disease.  Dg Abd Portable 1v 11/04/2013 Constipation with fecal impaction. Electronically Signed By: Rozetta Nunnery M.D. On: 11/04/2013 14:06  Medical Consultants:   None. Anti-Infectives:   None.   SignedLeisa Lenz, MD  [facility] 11/06/2013, 10:07 AM  Pager #: [phone]   Discharge Exam: Filed Vitals:   11/06/13 0428  BP: 109/57  Pulse: 67  Temp: 98 F (36.7 C)  Resp: 14   Filed Vitals:   11/05/13 0437 11/05/13 1320 11/05/13 2143 11/06/13 0428  BP: 121/70 121/74 109/60 109/57  Pulse: 68 70 70 67  Temp: 98.1 F (36.7 C) 98.3 F (36.8 C) 98.1 F (36.7 C) 98 F (36.7 C)  TempSrc: Oral Oral Oral Oral  Resp: 14 16 14 14   Height:      Weight:      SpO2: 100% 100% 100% 100%    General: Pt is alert, still disoriented Cardiovascular: Regular rate and rhythm, S1/S2 +, no murmurs Respiratory: Bilateral air entry, no wheezing Abdominal: Soft, non tender, non distended, bowel sounds +, no guarding Extremities: no cyanosis, pulses palpable bilaterally DP and PT Neuro: Grossly nonfocal  Discharge  Instructions  Discharge Instructions   Call MD for:  difficulty breathing, headache or visual disturbances    Complete by:  As directed      Call MD for:  persistant dizziness or light-headedness    Complete by:  As directed      Call MD for:  persistant nausea and vomiting    Complete by:  As directed      Call MD for:  severe uncontrolled pain    Complete by:  As directed      Diet - low sodium heart healthy    Complete by:  As directed      Discharge instructions    Complete by:  As directed        Increase activity slowly    Complete by:  As directed             Medication List         acetaminophen 325 MG tablet  Commonly known as:  TYLENOL  Take 2 tablets (650 mg total) by mouth every 6 (six) hours as needed for mild pain (or Fever >/= 101).     antiseptic oral rinse 0.05 % Liqd solution  Commonly known as:  CPC / CETYLPYRIDINIUM CHLORIDE 0.05%  7 mLs by Mouth Rinse route 2 times daily at 12 noon and 4 pm.     atorvastatin 10 MG tablet  Commonly known as:  LIPITOR  Take 10 mg by mouth at bedtime.     bimatoprost 0.03 % ophthalmic solution  Commonly known as:  LUMIGAN  Place 1 drop into both eyes at bedtime.     bisacodyl 10 MG suppository  Commonly known as:  DULCOLAX  Place 1 suppository (10 mg total) rectally daily as needed for moderate constipation.     calcium-vitamin D 500-200 MG-UNIT per tablet  Commonly known as:  OSCAL WITH D  Take 2 tablets by mouth daily.     chlorhexidine 0.12 % solution  Commonly known as:  PERIDEX  15 mLs by Mouth Rinse route 2 (two) times daily.     cholecalciferol 1000 UNITS tablet  Commonly known as:  VITAMIN D  Take 1,000 Units by mouth daily.     desmopressin 0.2 MG tablet  Commonly known as:  DDAVP  Take 0.4 mg by mouth 2 (two) times daily.     diclofenac sodium 1 % Gel  Commonly known as:  VOLTAREN  Apply 4 g topically 4 (four) times daily. Apply to left shoulder and left knee     doxycycline 100 MG tablet   Commonly known as:  VIBRA-TABS  Take 1 tablet (100 mg total) by mouth 2 (two) times daily.     hydrOXYzine 10 MG tablet  Commonly known as:  ATARAX/VISTARIL  Take 10 mg by mouth 2 (two) times daily as needed for anxiety. For Anxiety     INVEGA 1.5 MG Tb24  Generic drug:  Paliperidone  Take 1 tablet by mouth daily.     lansoprazole 30 MG capsule  Commonly known as:  PREVACID  Take 30 mg by mouth 2 (two) times daily.     levothyroxine 75 MCG tablet  Commonly known as:  SYNTHROID, LEVOTHROID  Take 1 tablet (75 mcg total) by mouth daily before breakfast.     multivitamin tablet  Take 1 tablet by mouth daily.     NUTRI-DRINK PO  Take 120 mLs by mouth 3 (three) times daily.     feeding supplement (ENSURE COMPLETE) Liqd  Take 237 mLs by mouth 2 (two) times daily between meals.     OXYGEN  Inhale 2 L into the lungs.     PARoxetine 20 MG tablet  Commonly known as:  PAXIL  Take 20 mg by mouth every morning.     polyethylene glycol packet  Commonly known as:  MIRALAX / GLYCOLAX  Take 17 g by mouth 2 (two) times daily.     polyethylene glycol packet  Commonly known as:  MIRALAX / GLYCOLAX  Take 17 g by mouth daily.     sennosides-docusate sodium 8.6-50 MG tablet  Commonly known as:  SENOKOT-S  Take 2 tablets by mouth at bedtime.     traZODone 50 MG tablet  Commonly known as:  DESYREL  Take 0.5 tablets (25 mg total) by mouth at bedtime as needed (insomnia).           Follow-up Information   Follow up with REED, TIFFANY, DO. Schedule an appointment as soon as possible for a visit in 2 weeks. (Follow up appt after recent hospitalization)    Specialty:  Geriatric Medicine   Contact information:   [address]. [place] [address] [phone]        The results of significant diagnostics from this hospitalization (including imaging, microbiology, ancillary and laboratory) are listed below for reference.    Significant Diagnostic Studies: Dg Chest Portable 1  View  11/01/2013   CLINICAL DATA:  Altered mental status  EXAM: PORTABLE CHEST - 1 VIEW  COMPARISON:  05/07/2013  FINDINGS: Patient is mildly rotated.  Chin obscures the right lung apex.  Lungs are essentially clear.  No pleural effusion or pneumothorax.  The heart is top-normal in size.  Degenerative changes of the thoracic spine and left shoulder.  IMPRESSION: No evidence of acute cardiopulmonary disease.   Electronically Signed   By: Julian Hy M.D.   On: 11/01/2013 15:49   Dg Abd Portable 1v  11/04/2013   CLINICAL DATA:  Abdominal distention.  EXAM: PORTABLE ABDOMEN - 1 VIEW  COMPARISON:  CT scan of the abdomen dated 09/13/2011 and abdominal radiograph dated 05/13/2008  FINDINGS: There is extensive stool throughout the colon with a large amount of stool in the rectum consistent with fecal impaction. There are no dilated loops of large or small bowel. No visible free air or free fluid. Lumbar scoliosis with degenerative disc and joint disease in the lower lumbar spine.  IMPRESSION: Constipation with fecal impaction.   Electronically Signed   By: Rozetta Nunnery M.D.   On: 11/04/2013 14:06    Microbiology: Recent Results (from the past 240 hour(s))  MRSA PCR SCREENING     Status: Abnormal   Collection Time    11/01/13 10:55 PM      Result Value Ref Range Status   MRSA by PCR INVALID RESULTS, SPECIMEN SENT FOR CULTURE (*) NEGATIVE Final   Comment: RESULT CALLED TO, READ BACK BY AND VERIFIED WITH:     ACQUAAH,E RN AT 0103 10.26.15 BY TIBBITTS,K                The GeneXpert MRSA Assay (FDA     approved for NASAL specimens     only), is one component of a     comprehensive MRSA colonization     surveillance program. It is not     intended to diagnose MRSA     infection nor to guide or     monitor treatment for     MRSA infections.  MRSA CULTURE     Status: None   Collection Time    11/01/13 10:55 PM      Result Value Ref Range Status   Specimen Description NOSE   Final   Special  Requests NONE   Final   Culture     Final   Value: NO STAPHYLOCOCCUS AUREUS ISOLATED     Note: NOMRSA     Performed at [facility]   Report Status 11/04/2013 FINAL   Final  MRSA PCR SCREENING     Status: None   Collection Time    11/02/13  5:05 PM      Result Value Ref Range Status   MRSA by PCR NEGATIVE  NEGATIVE Final   Comment:            The GeneXpert MRSA Assay (FDA     approved for NASAL specimens     only), is one component of a     comprehensive MRSA colonization     surveillance program. It is not     intended to diagnose MRSA     infection nor to guide or     monitor treatment for     MRSA infections.     DELTA CHECK NOTED     Labs: Basic Metabolic Panel:  Recent Labs Lab 11/02/13 1617 11/03/13 0520 11/04/13 0505 11/05/13 0650 11/06/13 0804  NA 166*  159* 151* 150* 150*  K 3.5* 3.2* 3.4* 4.6 4.9  CL >130* 123* 115* 117* 116*  CO2 27 26 22  16* 19  GLUCOSE 175* 118* 171* 120* 114*  BUN 48* 38* 29* 22 16  CREATININE 1.42* 1.28* 1.17* 1.03 1.00  CALCIUM 8.8 8.4 8.5 9.1 8.7  MG  --   --  2.3  --   --    Liver Function Tests:  Recent Labs Lab 11/01/13 1605  AST 15  ALT 7  ALKPHOS 96  BILITOT 0.5  PROT 7.9  ALBUMIN 3.5   No results found for this basename: LIPASE, AMYLASE,  in the last 168 hours No results found for this basename: AMMONIA,  in the last 168 hours CBC:  Recent Labs Lab 11/01/13 1504 11/02/13 0405 11/03/13 0520 11/04/13 0700  WBC 9.1 8.7 6.5 7.7  HGB 15.5* 13.2 11.4* 12.7  HCT 53.3* 46.0 39.0 42.8  MCV 104.7* 104.3* 102.4* 98.2  PLT 188 163 139* 137*   Cardiac Enzymes:  Recent Labs Lab 11/01/13 1500  TROPONINI <0.30   BNP: BNP (last 3 results)  Recent Labs  05/07/13 1300  PROBNP 118.8   CBG:  Recent Labs Lab 11/05/13 1956 11/06/13 0031 11/06/13 0427 11/06/13 0614 11/06/13 0732  GLUCAP 125* 127* 100* 97 97    Time coordinating discharge: Over 30 minutes

## 2013-11-06 NOTE — Plan of Care (Signed)
Problem: Discharge Progression Outcomes Goal: Discharge plan in place and appropriate Outcome: Completed/Met Date Met:  11/06/13 Discharge to [facility]

## 2013-11-06 NOTE — Clinical Social Work Note (Signed)
Patient for d/c today to SNF bed at  [facility]. Confirmed plans with SNF rep and family. Daughter and patient agreeable to this plan- plan transfer via EMS. Eduard Clos, MSW, [place]

## 2013-11-06 NOTE — Clinical Social Work Placement (Signed)
Clinical Social Work Department CLINICAL SOCIAL WORK PLACEMENT NOTE 11/06/2013  Patient:  [name], [name]  Account Number:  [id number] Admit date:  11/01/2013  Clinical Social Worker:  Ulyess Blossom  Date/time:  11/03/2013 12:36 PM  Clinical Social Work is seeking post-discharge placement for this patient at the following level of care:   SKILLED NURSING   (*CSW will update this form in Epic as items are completed)   11/02/2013  Patient/family provided with [place] Department of Clinical Social Work's list of facilities offering this level of care within the geographic area requested by the patient (or if unable, by the patient's family).  11/02/2013  Patient/family informed of their freedom to choose among providers that offer the needed level of care, that participate in Medicare, Medicaid or managed care program needed by the patient, have an available bed and are willing to accept the patient.  11/02/2013  Patient/family informed of [facility]' ownership interest in [facility], as well as of the fact that they are under no obligation to receive care at this facility.  PASARR submitted to EDS on 11/02/2013 PASARR number received on 11/02/2013  FL2 transmitted to all facilities in geographic area requested by pt/family on  11/02/2013 FL2 transmitted to all facilities within larger geographic area on   Patient informed that his/her managed care company has contracts with or will negotiate with  certain facilities, including the following:     Patient/family informed of bed offers received:  11/03/2013 Patient chooses bed at [place] Physician recommends and patient chooses bed at    Patient to be transferred to [facility] on  11/06/2013 Patient to be transferred to facility by ems Patient and family notified of transfer on 11/06/2013 Name of family member notified:  Daughter [name]  The following physician  request were entered in Epic:   Additional Comments: Eduard Clos, MSW, [place]

## 2013-11-06 NOTE — Consult Note (Signed)
WOC wound consult note Reason for Consult:  Evaluation of left arm blister.  Pt from SNF, does not communicate. Did open her eyes for me today but closed them right back shut.  Wound type: blister related to edema left inner upper arm Pressure Ulcer POA: Yes see my partners note from this week.  Measurement: I was not able to visualize the entire blister   Wound bed: intact serous filled bulla  Drainage (amount, consistency, odor) none  Periwound: ecchymosis and erythema  Dressing procedure/placement/frequency: Silicone foam initiated by the bedside staff and this is working well. This patient does have very frail, thin skin and as I was attempting to assess the wound I was fearful of rupturing the bulla.   Continue silicone foam, change every 3 days. Due next on Nov. 1st.  Discussed POC with patient and bedside nurse.  Re consult if needed, will not follow at this time. Thanks  Eaven Schwager [place], [place] [phone])

## 2013-11-06 NOTE — Discharge Instructions (Signed)
° °

## 2013-11-06 NOTE — Progress Notes (Signed)
Report called to [place] at [facility]. Transport scheduled for 2 pm

## 2013-11-11 ENCOUNTER — Non-Acute Institutional Stay (SKILLED_NURSING_FACILITY): Payer: Medicare Other | Admitting: Internal Medicine

## 2013-11-11 DIAGNOSIS — L89152 Pressure ulcer of sacral region, stage 2: Secondary | ICD-10-CM

## 2013-11-11 DIAGNOSIS — F319 Bipolar disorder, unspecified: Secondary | ICD-10-CM

## 2013-11-11 DIAGNOSIS — G9341 Metabolic encephalopathy: Secondary | ICD-10-CM

## 2013-11-11 DIAGNOSIS — N183 Chronic kidney disease, stage 3 unspecified: Secondary | ICD-10-CM

## 2013-11-11 DIAGNOSIS — F0280 Dementia in other diseases classified elsewhere without behavioral disturbance: Secondary | ICD-10-CM

## 2013-11-11 DIAGNOSIS — D631 Anemia in chronic kidney disease: Secondary | ICD-10-CM

## 2013-11-11 DIAGNOSIS — E034 Atrophy of thyroid (acquired): Secondary | ICD-10-CM

## 2013-11-11 DIAGNOSIS — L03114 Cellulitis of left upper limb: Secondary | ICD-10-CM

## 2013-11-11 DIAGNOSIS — G309 Alzheimer's disease, unspecified: Secondary | ICD-10-CM

## 2013-11-11 DIAGNOSIS — E232 Diabetes insipidus: Secondary | ICD-10-CM

## 2013-11-11 DIAGNOSIS — N189 Chronic kidney disease, unspecified: Secondary | ICD-10-CM

## 2013-11-11 DIAGNOSIS — E038 Other specified hypothyroidism: Secondary | ICD-10-CM

## 2013-11-11 DIAGNOSIS — I10 Essential (primary) hypertension: Secondary | ICD-10-CM

## 2013-11-11 DIAGNOSIS — E43 Unspecified severe protein-calorie malnutrition: Secondary | ICD-10-CM

## 2013-11-11 NOTE — Progress Notes (Signed)
MRN: [id number] Name: [name]  Sex: female Age: 79 y.o. DOB: January 03, 1935  [place] #: [facility] Facility/Room: 413 Level Of Care: SNF Provider: Inocencio Homes D Emergency Contacts: Extended Emergency Contact Information Primary Emergency Contact: Buchanan,[name] Address: [address], [place] Phone: [phone] Work Phone: [phone] Relation: Daughter :   Allergies: Lithium; Penicillins; and Sulfa antibiotics  Chief Complaint  Patient presents with  . Readmit To SNF    HPI: Patient is 79 y.o. female who was admitted for altered ms,found to be hypernatremic, treated and released and is admitted to SNF.  Past Medical History  Diagnosis Date  . Thyroid cancer   . Ulcer     chronic  . Depression   . Vitamin D deficiency   . Anxiety   . Malaise and fatigue   . Osteoarthritis   . Kidney disease   . Pneumonia   . Dementia   . Alzheimer disease   . Hypothyroidism   . Bipolar 1 disorder   . Dehydration   . Hypertension   . Diabetes mellitus   . Pressure ulcer stage III   . Constipation   . Open-angle glaucoma   . Anemia   . Peripheral neuropathy   . Hypertensive heart disease   . CKD (chronic kidney disease), stage III   . Osteoarthrosis   . Mixed hyperlipidemia     Past Surgical History  Procedure Laterality Date  . Esophagogastroduodenoscopy  02/23/2011    Procedure: ESOPHAGOGASTRODUODENOSCOPY (EGD);  Surgeon: Beryle Beams, MD;  Location: [facility] ENDOSCOPY;  Service: Endoscopy;  Laterality: N/A;  . Abdominal hysterectomy    . Foot surgery    . Retinal detachment surgery    . Thyroidectomy        Medication List       This list is accurate as of: 11/11/13 11:59 PM.  Always use your most recent med list.               acetaminophen 325 MG tablet  Commonly known as:  TYLENOL  Take 2 tablets (650 mg total) by mouth every 6 (six) hours as needed for mild pain (or Fever >/= 101).     antiseptic oral  rinse 0.05 % Liqd solution  Commonly known as:  CPC / CETYLPYRIDINIUM CHLORIDE 0.05%  7 mLs by Mouth Rinse route 2 times daily at 12 noon and 4 pm.     atorvastatin 10 MG tablet  Commonly known as:  LIPITOR  Take 10 mg by mouth at bedtime.     bimatoprost 0.03 % ophthalmic solution  Commonly known as:  LUMIGAN  Place 1 drop into both eyes at bedtime.     bisacodyl 10 MG suppository  Commonly known as:  DULCOLAX  Place 1 suppository (10 mg total) rectally daily as needed for moderate constipation.     calcium-vitamin D 500-200 MG-UNIT per tablet  Commonly known as:  OSCAL WITH D  Take 2 tablets by mouth daily.     chlorhexidine 0.12 % solution  Commonly known as:  PERIDEX  15 mLs by Mouth Rinse route 2 (two) times daily.     cholecalciferol 1000 UNITS tablet  Commonly known as:  VITAMIN D  Take 1,000 Units by mouth daily.     desmopressin 0.2 MG tablet  Commonly known as:  DDAVP  Take 0.2-0.4 mg by mouth 3 (three) times daily. 0.4mg  at 1000, 0.2mg  at 1400, 0.4mg  at 2200.  diclofenac sodium 1 % Gel  Commonly known as:  VOLTAREN  Apply 4 g topically 4 (four) times daily. Apply to left shoulder and left knee     doxycycline 100 MG tablet  Commonly known as:  VIBRA-TABS  Take 1 tablet (100 mg total) by mouth 2 (two) times daily.     hydrOXYzine 10 MG tablet  Commonly known as:  ATARAX/VISTARIL  Take 10 mg by mouth 2 (two) times daily as needed for anxiety. For Anxiety     INVEGA 1.5 MG Tb24  Generic drug:  Paliperidone  Take 1 tablet by mouth daily.     lansoprazole 30 MG capsule  Commonly known as:  PREVACID  Take 30 mg by mouth 2 (two) times daily.     levothyroxine 75 MCG tablet  Commonly known as:  SYNTHROID, LEVOTHROID  Take 1 tablet (75 mcg total) by mouth daily before breakfast.     multivitamin tablet  Take 1 tablet by mouth daily.     NUTRI-DRINK PO  Take 120 mLs by mouth 3 (three) times daily.     feeding supplement (ENSURE COMPLETE) Liqd  Take  237 mLs by mouth 2 (two) times daily between meals.     OXYGEN  Inhale 2 L into the lungs.     PARoxetine 20 MG tablet  Commonly known as:  PAXIL  Take 20 mg by mouth every morning.     polyethylene glycol packet  Commonly known as:  MIRALAX / GLYCOLAX  Take 17 g by mouth 2 (two) times daily.     polyethylene glycol packet  Commonly known as:  MIRALAX / GLYCOLAX  Take 17 g by mouth daily.     sennosides-docusate sodium 8.6-50 MG tablet  Commonly known as:  SENOKOT-S  Take 2 tablets by mouth at bedtime.     traZODone 50 MG tablet  Commonly known as:  DESYREL  Take 0.5 tablets (25 mg total) by mouth at bedtime as needed (insomnia).        No orders of the defined types were placed in this encounter.    Immunization History  Administered Date(s) Administered  . Influenza Whole 10/16/2012  . Pneumococcal Conjugate-13 09/08/2007  . Pneumococcal-Unspecified 02/05/2011  . Tdap 09/08/2007  . Zoster 09/08/2007    History  Substance Use Topics  . Smoking status: Never Smoker   . Smokeless tobacco: Not on file  . Alcohol Use: No    Family history is noncontributory    Review of Systems  DATA OBTAINED: from patient GENERAL:  no fevers, fatigue, appetite changes SKIN: No itching, rash or wounds EYES: No eye pain, redness, discharge EARS: No earache, tinnitus, change in hearing NOSE: No congestion, drainage or bleeding  MOUTH/THROAT: No mouth or tooth pain, No sore throat RESPIRATORY: No cough, wheezing, SOB CARDIAC: No chest pain, palpitations, lower extremity edema  GI: No abdominal pain, No N/V/D or constipation, No heartburn or reflux  GU: No dysuria, frequency or urgency, or incontinence  MUSCULOSKELETAL: No unrelieved bone/joint pain NEUROLOGIC: No headache, dizziness  PSYCHIATRIC: No overt anxiety or sadness, No behavior issue.   Filed Vitals:   11/11/13 1221  BP: 110/72  Pulse: 61  Temp: 96.8 F (36 C)  Resp: 18    Physical Exam  GENERAL  APPEARANCE: Alert, modconversant,  No acute distress.  SKIN: No diaphoresis rash HEAD: Normocephalic, atraumatic  EYES: Conjunctiva/lids clear. Pupils round, reactive. EOMs intact.  EARS: External exam WNL, canals clear. Hearing grossly normal.  NOSE: No deformity or discharge.  MOUTH/THROAT: Lips w/o lesions  RESPIRATORY: Breathing is even, unlabored. Lung sounds are clear   CARDIOVASCULAR: Heart RRR no murmurs, rubs or gallops. No peripheral edema.   GASTROINTESTINAL: Abdomen is soft, non-tender, not distended w/ normal bowel sounds. GENITOURINARY: Bladder non tender, not distended  MUSCULOSKELETAL: No abnormal joints or musculature NEUROLOGIC:  Cranial nerves 2-12 grossly intact  PSYCHIATRIC: Mood and affect appropriate to situation, no behavioral issues  Patient Active Problem List   Diagnosis Date Noted  . CKD (chronic kidney disease) stage 3, GFR 30-59 ml/min 11/13/2013  . Cellulitis of arm, left 11/13/2013  . Protein-calorie malnutrition, severe 11/02/2013  . Metabolic encephalopathy 85/02/7739  . 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 Alzheimer's disease 08/29/2013  . Thyroid activity decreased 08/29/2013  . Arthritis, senescent 08/29/2013  . Esophageal reflux 08/29/2013  . Chronic constipation 08/29/2013  . Bipolar 1 disorder, depressed 01/13/2013  . ARF (acute renal failure) 01/13/2013  . Moderate protein-calorie malnutrition 01/09/2013  . Normocytic anemia 01/09/2013  . Sacral decubitus ulcer, stage II 01/09/2013  . Hypernatremia 01/07/2013  . Dehydration 01/04/2013  . Hypokalemia 01/04/2013  . Hypothyroidism 01/04/2013  . Dementia without behavioral disturbance 12/08/2012  . Unspecified vitamin D deficiency 07/21/2012  . Anemia in chronic kidney disease 06/19/2012  . Dyslipidemia 06/03/2012  . Osteoarthritis 06/03/2012  . Manic depressive disorder 04/11/2012  . Unspecified constipation 04/11/2012  . GERD  (gastroesophageal reflux disease) 04/11/2012  . Osteoporosis, unspecified 04/11/2012  . Diabetes insipidus -  lithium-induced 09/07/2011  . History of iron deficiency anemia 09/07/2011  . Acute blood loss anemia 09/06/2011  . Essential hypertension, benign 02/01/2011  . Depressive disorder, not elsewhere classified 02/01/2011  . Unspecified hypothyroidism 02/01/2011    CBC    Component Value Date/Time   WBC 7.7 11/04/2013 0700   RBC 4.36 11/04/2013 0700   RBC 4.43 09/06/2011 1955   HGB 12.7 11/04/2013 0700   HCT 42.8 11/04/2013 0700   PLT 137* 11/04/2013 0700   MCV 98.2 11/04/2013 0700   LYMPHSABS 1.3 05/07/2013 1300   MONOABS 0.7 05/07/2013 1300   EOSABS 0.2 05/07/2013 1300   BASOSABS 0.0 05/07/2013 1300    CMP     Component Value Date/Time   NA 150* 11/06/2013 0804   K 4.9 11/06/2013 0804   CL 116* 11/06/2013 0804   CO2 19 11/06/2013 0804   GLUCOSE 114* 11/06/2013 0804   BUN 16 11/06/2013 0804   CREATININE 1.00 11/06/2013 0804   CALCIUM 8.7 11/06/2013 0804   CALCIUM 9.7 05/11/2008 0533   PROT 7.9 11/01/2013 1605   ALBUMIN 3.5 11/01/2013 1605   AST 15 11/01/2013 1605   ALT 7 11/01/2013 1605   ALKPHOS 96 11/01/2013 1605   BILITOT 0.5 11/01/2013 1605   GFRNONAA 53* 11/06/2013 0804   GFRAA 61* 11/06/2013 0804    Assessment and Plan  Metabolic encephalopathy encephalopathy secondary to hypernatremia   Sodium stable at 150. It has trended down slowly from 166 at the time of the admission down to 150 at the time of discharge.  Resume vasopressin per previous nursing home dose.  Monitor sodium level at least every other day to make sure your each range 135-145.  Protein-calorie malnutrition, severe  Dysphagia 3 diet per nutritionist.   Had nausea and vomiting which now resolved. 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 Pressure ulcer at sacrum: 5cm x 6.5cm with no depth. Presentation consistent with suspected deep tissue injury. Dressing recommendations: turning and repositioning to limit positioning in the supine position where sacrum and heels are at greatest risk for pressure ulceration. Pressure redistribution bilateral heel boots provided. A soft silicone foam dressing to be placed over the affected area at the sacrum and changed twice weekly and PRN for soiling. Thanks  Anemia in chronic kidney disease  Secondary to history of CKD.   Hemoglobin is 11.4 --> 12.7. No current indications for transfusion  Bipolar 1 disorder, depressed Continue inverga,paxil and trazodone  Alzheimer's disease SNF supervision  CKD (chronic kidney disease) stage 3, GFR 30-59 ml/min Baseline Cr 1.28 -stable  Cellulitis of arm, left Blisters - doxycycline 100 mg BID total 7 days    Hennie Duos, MD

## 2013-11-13 ENCOUNTER — Encounter: Payer: Self-pay | Admitting: Internal Medicine

## 2013-11-13 DIAGNOSIS — L03114 Cellulitis of left upper limb: Secondary | ICD-10-CM | POA: Insufficient documentation

## 2013-11-13 DIAGNOSIS — N183 Chronic kidney disease, stage 3 unspecified: Secondary | ICD-10-CM | POA: Insufficient documentation

## 2013-11-13 NOTE — Assessment & Plan Note (Signed)
SNF supervision

## 2013-11-13 NOTE — Assessment & Plan Note (Signed)
Baseline Cr 1.28 -stable

## 2013-11-13 NOTE — Assessment & Plan Note (Signed)
stage II   Appreciate wound care recommendations. Pressure ulcer at sacrum: 5cm x 6.5cm with no depth. Presentation consistent with suspected deep tissue injury. Dressing recommendations: turning and repositioning to limit positioning in the supine position where sacrum and heels are at greatest risk for pressure ulceration. Pressure redistribution bilateral heel boots provided. A soft silicone foam dressing to be placed over the affected area at the sacrum and changed twice weekly and PRN for soiling. Thanks

## 2013-11-13 NOTE — Assessment & Plan Note (Signed)
vassopressin trestarted

## 2013-11-13 NOTE — Assessment & Plan Note (Signed)
   Secondary to history of CKD.   Hemoglobin is 11.4 --> 12.7. No current indications for transfusion

## 2013-11-13 NOTE — Assessment & Plan Note (Signed)
At goal on no meds

## 2013-11-13 NOTE — Assessment & Plan Note (Signed)
encephalopathy secondary to hypernatremia   Sodium stable at 150. It has trended down slowly from 166 at the time of the admission down to 150 at the time of discharge.  Resume vasopressin per previous nursing home dose.  Monitor sodium level at least every other day to make sure your each range 135-145.

## 2013-11-13 NOTE — Assessment & Plan Note (Signed)
New dose 75 mcg 2/2 TSH 0.05

## 2013-11-13 NOTE — Assessment & Plan Note (Signed)
   Dysphagia 3 diet per nutritionist.   Had nausea and vomiting which now resolved. Abd x ray 11/04/2013 showed no obstruction

## 2013-11-13 NOTE — Assessment & Plan Note (Signed)
Controlled, 6.4

## 2013-11-13 NOTE — Assessment & Plan Note (Signed)
Blisters - doxycycline 100 mg BID total 7 days

## 2013-11-13 NOTE — Assessment & Plan Note (Signed)
Continue inverga,paxil and trazodone

## 2013-11-30 ENCOUNTER — Non-Acute Institutional Stay (SKILLED_NURSING_FACILITY): Payer: Medicare Other | Admitting: Internal Medicine

## 2013-11-30 DIAGNOSIS — G309 Alzheimer's disease, unspecified: Secondary | ICD-10-CM

## 2013-11-30 DIAGNOSIS — E870 Hyperosmolality and hypernatremia: Secondary | ICD-10-CM

## 2013-11-30 DIAGNOSIS — F0280 Dementia in other diseases classified elsewhere without behavioral disturbance: Secondary | ICD-10-CM

## 2013-11-30 DIAGNOSIS — H109 Unspecified conjunctivitis: Secondary | ICD-10-CM

## 2013-11-30 DIAGNOSIS — I10 Essential (primary) hypertension: Secondary | ICD-10-CM

## 2013-11-30 NOTE — Progress Notes (Signed)
Patient ID: [name], female   DOB: 01-20-1934, 79 y.o.   MRN: [id number] MRN: [id number] Name: [name]  Sex: female Age: 79 y.o. DOB: Jan 05, 1935  [place] #: [facility] Facility/Room: 413 Level Of Care: SNF Provider: Wille Celeste Emergency Contacts: Extended Emergency Contact Information Primary Emergency Contact: Farmer,Erica Address: [address], [place] Phone: [phone] Work Phone: [phone] Relation: Daughter :   Allergies: Lithium; Penicillins; and Sulfa antibiotics  Chief Complaint  Patient presents with  . Medical Management of Chronic Issues    HPI: Patient is 79 y.o. female who was admitted for altered ms,found to be hypernatremic, treated and released and is admitted to SNF.--The nursing staff does not report any acute issues other than apparently some erythematous eyes bilaterally patient is a poor historian secondary severe dementia and cannot really give any review of systems-apparently her family has noticed this with possibly some exudate in the morning  Nursing staff does not report any other recent issues-  Past Medical History  Diagnosis Date  . Thyroid cancer   . Ulcer     chronic  . Depression   . Vitamin D deficiency   . Anxiety   . Malaise and fatigue   . Osteoarthritis   . Kidney disease   . Pneumonia   . Dementia   . Alzheimer disease   . Hypothyroidism   . Bipolar 1 disorder   . Dehydration   . Hypertension   . Diabetes mellitus   . Pressure ulcer stage III   . Constipation   . Open-angle glaucoma   . Anemia   . Peripheral neuropathy   . Hypertensive heart disease   . CKD (chronic kidney disease), stage III   . Osteoarthrosis   . Mixed hyperlipidemia     Past Surgical History  Procedure Laterality Date  . Esophagogastroduodenoscopy  02/23/2011    Procedure: ESOPHAGOGASTRODUODENOSCOPY (EGD);  Surgeon: Beryle Beams, MD;  Location: [facility] ENDOSCOPY;  Service: Endoscopy;   Laterality: N/A;  . Abdominal hysterectomy    . Foot surgery    . Retinal detachment surgery    . Thyroidectomy        Medication List       This list is accurate as of: 11/30/13 11:59 PM.  Always use your most recent med list.               acetaminophen 325 MG tablet  Commonly known as:  TYLENOL  Take 2 tablets (650 mg total) by mouth every 6 (six) hours as needed for mild pain (or Fever >/= 101).     antiseptic oral rinse 0.05 % Liqd solution  Commonly known as:  CPC / CETYLPYRIDINIUM CHLORIDE 0.05%  7 mLs by Mouth Rinse route 2 times daily at 12 noon and 4 pm.     atorvastatin 10 MG tablet  Commonly known as:  LIPITOR  Take 10 mg by mouth at bedtime.     bimatoprost 0.03 % ophthalmic solution  Commonly known as:  LUMIGAN  Place 1 drop into both eyes at bedtime.     bisacodyl 10 MG suppository  Commonly known as:  DULCOLAX  Place 1 suppository (10 mg total) rectally daily as needed for moderate constipation.     calcium-vitamin D 500-200 MG-UNIT per tablet  Commonly known as:  OSCAL WITH D  Take 2 tablets by mouth daily.     chlorhexidine 0.12 % solution  Commonly  known as:  PERIDEX  15 mLs by Mouth Rinse route 2 (two) times daily.     cholecalciferol 1000 UNITS tablet  Commonly known as:  VITAMIN D  Take 1,000 Units by mouth daily.     desmopressin 0.2 MG tablet  Commonly known as:  DDAVP  Take 0.2-0.4 mg by mouth 3 (three) times daily. 0.4mg  at 1000, 0.2mg  at 1400, 0.4mg  at 2200.     diclofenac sodium 1 % Gel  Commonly known as:  VOLTAREN  Apply 4 g topically 4 (four) times daily. Apply to left shoulder and left knee     doxycycline 100 MG tablet  Commonly known as:  VIBRA-TABS  Take 1 tablet (100 mg total) by mouth 2 (two) times daily.     hydrOXYzine 10 MG tablet  Commonly known as:  ATARAX/VISTARIL  Take 10 mg by mouth 2 (two) times daily as needed for anxiety. For Anxiety     INVEGA 1.5 MG Tb24  Generic drug:  Paliperidone  Take 1 tablet by  mouth daily.     lansoprazole 30 MG capsule  Commonly known as:  PREVACID  Take 30 mg by mouth 2 (two) times daily.     levothyroxine 75 MCG tablet  Commonly known as:  SYNTHROID, LEVOTHROID  Take 1 tablet (75 mcg total) by mouth daily before breakfast.     multivitamin tablet  Take 1 tablet by mouth daily.     NUTRI-DRINK PO  Take 120 mLs by mouth 3 (three) times daily.     feeding supplement (ENSURE COMPLETE) Liqd  Take 237 mLs by mouth 2 (two) times daily between meals.     OXYGEN  Inhale 2 L into the lungs.     PARoxetine 20 MG tablet  Commonly known as:  PAXIL  Take 20 mg by mouth every morning.     polyethylene glycol packet  Commonly known as:  MIRALAX / GLYCOLAX  Take 17 g by mouth 2 (two) times daily.     polyethylene glycol packet  Commonly known as:  MIRALAX / GLYCOLAX  Take 17 g by mouth daily.     sennosides-docusate sodium 8.6-50 MG tablet  Commonly known as:  SENOKOT-S  Take 2 tablets by mouth at bedtime.     traZODone 50 MG tablet  Commonly known as:  DESYREL  Take 0.5 tablets (25 mg total) by mouth at bedtime as needed (insomnia).        No orders of the defined types were placed in this encounter.    Immunization History  Administered Date(s) Administered  . Influenza Whole 10/16/2012  . Pneumococcal Conjugate-13 09/08/2007  . Pneumococcal-Unspecified 02/05/2011  . Tdap 09/08/2007  . Zoster 09/08/2007    History  Substance Use Topics  . Smoking status: Never Smoker   . Smokeless tobacco: Not on file  . Alcohol Use: No    Family history is noncontributory    Review of Systems  DATA OBTAINED: from nursing staff GENERAL:  no fevers, fatigue, appetite changes SKIN: No itching, rash or wounds EYES: Eyes had a had some erythema possible discharge in the morning EARS: No earache, tinnitus, change in hearing NOSE: No congestion, drainage or bleeding  MOUTH/THROAT: No mouth or tooth pain, No sore throat RESPIRATORY: No cough,  wheezing, SOB CARDIAC: No chest pain, palpitations, lower extremity edema  GI: No abdominal pain, No N/V/D or constipation, No heartburn or reflux  GU: No dysuria, frequency or urgency, or incontinence  MUSCULOSKELETAL: No unrelieved bone/joint pain NEUROLOGIC: No headache,  dizziness  PSYCHIATRIC: No overt anxiety or sadness, No behavior issue.   Filed Vitals:   12/03/13 1507  BP: 99/68  Pulse: 80  Temp: 97.9 F (36.6 C)  Resp: 20    Physical Exam  GENERAL APPEARANCE: Alert, minimallyconversant,  No acute distress.  SKIN: No diaphoresis rash HEAD: Normocephalic, atraumatic  EYES: Conjunctiva/lids there is some erythematous conjunctivae bilaterally I could not really appreciate any exudate however. Pupils round, reactive. EOMs intact.  EARS: External exam WNL, canals clear. Hearing grossly normal.  NOSE: No deformity or discharge.  MOUTH/THROAT: Lips w/o lesions  RESPIRATORY: Breathing is even, unlabored. Lung sounds are clear o poor respiratory effort  CARDIOVASCULAR: Heart RRR no murmurs, rubs or gallops. No peripheral edema.   GASTROINTESTINAL: Abdomen is soft, non-tender, not distended w/ normal bowel sounds. GENITOURINARY: Bladder non tender, not distended  MUSCULOSKELETAL: No abnormal joints or musculature NEUROLOGIC:  Cranial nerves 2-12 grossly intact  PSYCHIATRIC: Per nursing staff she has been at her baseline--since it is  mid evening patient apparently is trying to sleep and is not speaking much  Patient Active Problem List   Diagnosis Date Noted  . CKD (chronic kidney disease) stage 3, GFR 30-59 ml/min 11/13/2013  . Cellulitis of arm, left 11/13/2013  . Protein-calorie malnutrition, severe 11/02/2013  . Metabolic encephalopathy 02/40/9735  . 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 Alzheimer's disease 08/29/2013  . Thyroid activity decreased 08/29/2013  . Arthritis, senescent 08/29/2013  . Esophageal reflux 08/29/2013   . Chronic constipation 08/29/2013  . Bipolar 1 disorder, depressed 01/13/2013  . ARF (acute renal failure) 01/13/2013  . Moderate protein-calorie malnutrition 01/09/2013  . Normocytic anemia 01/09/2013  . Sacral decubitus ulcer, stage II 01/09/2013  . Hypernatremia 01/07/2013  . Dehydration 01/04/2013  . Hypokalemia 01/04/2013  . Hypothyroidism 01/04/2013  . Dementia without behavioral disturbance 12/08/2012  . Unspecified vitamin D deficiency 07/21/2012  . Anemia in chronic kidney disease 06/19/2012  . Dyslipidemia 06/03/2012  . Osteoarthritis 06/03/2012  . Manic depressive disorder 04/11/2012  . Unspecified constipation 04/11/2012  . GERD (gastroesophageal reflux disease) 04/11/2012  . Osteoporosis, unspecified 04/11/2012  . Diabetes insipidus -  lithium-induced 09/07/2011  . History of iron deficiency anemia 09/07/2011  . Acute blood loss anemia 09/06/2011  . Essential hypertension, benign 02/01/2011  . Depressive disorder, not elsewhere classified 02/01/2011  . Unspecified hypothyroidism 02/01/2011    CBC    Component Value Date/Time   WBC 7.7 11/04/2013 0700   RBC 4.36 11/04/2013 0700   RBC 4.43 09/06/2011 1955   HGB 12.7 11/04/2013 0700   HCT 42.8 11/04/2013 0700   PLT 137* 11/04/2013 0700   MCV 98.2 11/04/2013 0700   LYMPHSABS 1.3 05/07/2013 1300   MONOABS 0.7 05/07/2013 1300   EOSABS 0.2 05/07/2013 1300   BASOSABS 0.0 05/07/2013 1300    CMP     Component Value Date/Time   NA 150* 11/06/2013 0804   K 4.9 11/06/2013 0804   CL 116* 11/06/2013 0804   CO2 19 11/06/2013 0804   GLUCOSE 114* 11/06/2013 0804   BUN 16 11/06/2013 0804   CREATININE 1.00 11/06/2013 0804   CALCIUM 8.7 11/06/2013 0804   CALCIUM 9.7 05/11/2008 0533   PROT 7.9 11/01/2013 1605   ALBUMIN 3.5 11/01/2013 1605   AST 15 11/01/2013 1605   ALT 7 11/01/2013 1605   ALKPHOS 96 11/01/2013 1605   BILITOT 0.5 11/01/2013 1605   GFRNONAA 53* 11/06/2013 0804   GFRAA 61* 11/06/2013 0804  Assessment and Plan  No problem-specific assessment & plan notes found for this encounter.     Assessment and Plan  Metabolic encephalopathy encephalopathy secondary to hypernatremia   Sodium stable at 150in hospital. It has trended down slowly from 166 at the time of the admission down to 150 at the time of discharge.--Will update this tomorrow--notify provider of results    .  Protein-calorie malnutrition, severe  Dysphagia 3 diet per nutritionist.   Had nausea and vomiting which now resolved. 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   Hemoglobin is 11.4 --> 12.7. --Will update CBC  Bipolar 1 disorder, depressed Continue inverga,paxil and trazodone--apparently this is been relatively stable  Alzheimer's disease SNF supervision--us appears to be fairly significant   Conjunctivitis-at this point will treat as allergic conjunctivitis a do not see any exudate but will have to be monitored certainly if there is significant exudate consider antibiotic drop-at this point will treat with Patanol for 1 week  Of note monitor patient with vital signs every shift for 48 hours   CKD (chronic kidney disease) stage 3, GFR 30-59 ml/min Baseline Cr 1.28 -stable--we'll update this  [id number]

## 2013-12-03 ENCOUNTER — Encounter: Payer: Self-pay | Admitting: Internal Medicine

## 2013-12-28 ENCOUNTER — Inpatient Hospital Stay (HOSPITAL_COMMUNITY)
Admission: EM | Admit: 2013-12-28 | Discharge: 2014-01-04 | DRG: 640 | Disposition: A | Discharge status: Skilled Nursing Facility | Payer: Medicare Other | Attending: Internal Medicine | Admitting: Internal Medicine

## 2013-12-28 ENCOUNTER — Encounter (HOSPITAL_COMMUNITY): Payer: Self-pay | Admitting: *Deleted

## 2013-12-28 DIAGNOSIS — F0280 Dementia in other diseases classified elsewhere without behavioral disturbance: Secondary | ICD-10-CM | POA: Diagnosis present

## 2013-12-28 DIAGNOSIS — I10 Essential (primary) hypertension: Secondary | ICD-10-CM | POA: Diagnosis present

## 2013-12-28 DIAGNOSIS — R627 Adult failure to thrive: Secondary | ICD-10-CM | POA: Diagnosis present

## 2013-12-28 DIAGNOSIS — E870 Hyperosmolality and hypernatremia: Secondary | ICD-10-CM | POA: Diagnosis not present

## 2013-12-28 DIAGNOSIS — E860 Dehydration: Secondary | ICD-10-CM | POA: Diagnosis present

## 2013-12-28 DIAGNOSIS — N179 Acute kidney failure, unspecified: Secondary | ICD-10-CM | POA: Diagnosis present

## 2013-12-28 DIAGNOSIS — R52 Pain, unspecified: Secondary | ICD-10-CM

## 2013-12-28 DIAGNOSIS — E039 Hypothyroidism, unspecified: Secondary | ICD-10-CM | POA: Diagnosis present

## 2013-12-28 DIAGNOSIS — G9341 Metabolic encephalopathy: Secondary | ICD-10-CM | POA: Diagnosis present

## 2013-12-28 DIAGNOSIS — G9340 Encephalopathy, unspecified: Secondary | ICD-10-CM

## 2013-12-28 DIAGNOSIS — E119 Type 2 diabetes mellitus without complications: Secondary | ICD-10-CM | POA: Diagnosis present

## 2013-12-28 DIAGNOSIS — D631 Anemia in chronic kidney disease: Secondary | ICD-10-CM | POA: Diagnosis present

## 2013-12-28 DIAGNOSIS — K219 Gastro-esophageal reflux disease without esophagitis: Secondary | ICD-10-CM | POA: Diagnosis present

## 2013-12-28 DIAGNOSIS — R0600 Dyspnea, unspecified: Secondary | ICD-10-CM

## 2013-12-28 DIAGNOSIS — E876 Hypokalemia: Secondary | ICD-10-CM | POA: Diagnosis present

## 2013-12-28 DIAGNOSIS — F319 Bipolar disorder, unspecified: Secondary | ICD-10-CM | POA: Diagnosis present

## 2013-12-28 DIAGNOSIS — H409 Unspecified glaucoma: Secondary | ICD-10-CM | POA: Diagnosis present

## 2013-12-28 DIAGNOSIS — E559 Vitamin D deficiency, unspecified: Secondary | ICD-10-CM | POA: Diagnosis present

## 2013-12-28 DIAGNOSIS — Z66 Do not resuscitate: Secondary | ICD-10-CM | POA: Diagnosis present

## 2013-12-28 DIAGNOSIS — F3130 Bipolar disorder, current episode depressed, mild or moderate severity, unspecified: Secondary | ICD-10-CM | POA: Diagnosis present

## 2013-12-28 DIAGNOSIS — G4700 Insomnia, unspecified: Secondary | ICD-10-CM | POA: Diagnosis present

## 2013-12-28 DIAGNOSIS — E785 Hyperlipidemia, unspecified: Secondary | ICD-10-CM | POA: Diagnosis present

## 2013-12-28 DIAGNOSIS — N183 Chronic kidney disease, stage 3 unspecified: Secondary | ICD-10-CM

## 2013-12-28 DIAGNOSIS — K5909 Other constipation: Secondary | ICD-10-CM | POA: Diagnosis present

## 2013-12-28 DIAGNOSIS — N189 Chronic kidney disease, unspecified: Secondary | ICD-10-CM

## 2013-12-28 DIAGNOSIS — Z515 Encounter for palliative care: Secondary | ICD-10-CM

## 2013-12-28 DIAGNOSIS — Z7401 Bed confinement status: Secondary | ICD-10-CM

## 2013-12-28 DIAGNOSIS — E782 Mixed hyperlipidemia: Secondary | ICD-10-CM | POA: Diagnosis present

## 2013-12-28 DIAGNOSIS — I1310 Hypertensive heart and chronic kidney disease without heart failure, with stage 1 through stage 4 chronic kidney disease, or unspecified chronic kidney disease: Secondary | ICD-10-CM | POA: Diagnosis present

## 2013-12-28 DIAGNOSIS — R0681 Apnea, not elsewhere classified: Secondary | ICD-10-CM | POA: Diagnosis present

## 2013-12-28 DIAGNOSIS — E232 Diabetes insipidus: Secondary | ICD-10-CM | POA: Diagnosis present

## 2013-12-28 DIAGNOSIS — Z85850 Personal history of malignant neoplasm of thyroid: Secondary | ICD-10-CM

## 2013-12-28 DIAGNOSIS — G309 Alzheimer's disease, unspecified: Secondary | ICD-10-CM | POA: Diagnosis present

## 2013-12-28 DIAGNOSIS — E43 Unspecified severe protein-calorie malnutrition: Secondary | ICD-10-CM | POA: Diagnosis present

## 2013-12-28 LAB — CBC WITH DIFFERENTIAL/PLATELET
Basophils Absolute: 0 10*3/uL (ref 0.0–0.1)
Basophils Relative: 0 % (ref 0–1)
Eosinophils Absolute: 0.1 10*3/uL (ref 0.0–0.7)
Eosinophils Relative: 2 % (ref 0–5)
HCT: 50.4 % — ABNORMAL HIGH (ref 36.0–46.0)
HEMOGLOBIN: 14.2 g/dL (ref 12.0–15.0)
LYMPHS ABS: 1.7 10*3/uL (ref 0.7–4.0)
Lymphocytes Relative: 22 % (ref 12–46)
MCH: 30.8 pg (ref 26.0–34.0)
MCHC: 28.2 g/dL — ABNORMAL LOW (ref 30.0–36.0)
MCV: 109.3 fL — ABNORMAL HIGH (ref 78.0–100.0)
MONOS PCT: 8 % (ref 3–12)
Monocytes Absolute: 0.6 10*3/uL (ref 0.1–1.0)
NEUTROS ABS: 5.3 10*3/uL (ref 1.7–7.7)
NEUTROS PCT: 68 % (ref 43–77)
PLATELETS: 126 10*3/uL — AB (ref 150–400)
RBC: 4.61 MIL/uL (ref 3.87–5.11)
RDW: 18 % — ABNORMAL HIGH (ref 11.5–15.5)
WBC: 7.8 10*3/uL (ref 4.0–10.5)

## 2013-12-28 LAB — URINALYSIS, ROUTINE W REFLEX MICROSCOPIC
Bilirubin Urine: NEGATIVE
GLUCOSE, UA: NEGATIVE mg/dL
Hgb urine dipstick: NEGATIVE
Ketones, ur: NEGATIVE mg/dL
Leukocytes, UA: NEGATIVE
Nitrite: NEGATIVE
Protein, ur: NEGATIVE mg/dL
SPECIFIC GRAVITY, URINE: 1.021 (ref 1.005–1.030)
UROBILINOGEN UA: 1 mg/dL (ref 0.0–1.0)
pH: 5.5 (ref 5.0–8.0)

## 2013-12-28 LAB — I-STAT TROPONIN, ED: TROPONIN I, POC: 0.02 ng/mL (ref 0.00–0.08)

## 2013-12-28 LAB — I-STAT CG4 LACTIC ACID, ED: LACTIC ACID, VENOUS: 1.1 mmol/L (ref 0.5–2.2)

## 2013-12-28 NOTE — ED Notes (Signed)
I-stat chem 8 sample did not provide usable results. EDP Walden aware.

## 2013-12-28 NOTE — ED Provider Notes (Signed)
CSN: [id number]     Arrival date & time 12/28/13  1957 History   First MD Initiated Contact with Patient 12/28/13 2006     Chief Complaint  Patient presents with  . Abnormal Lab    Ma 167     (Consider location/radiation/quality/duration/timing/severity/associated sxs/prior Treatment) Patient is a 79 y.o. female presenting with general illness. The history is provided by the nursing home and the EMS personnel.  Illness Location:  Nursing Home Quality:  Hypernatremia Severity:  Severe Onset quality:  Gradual Timing:  Constant Progression:  Worsening Chronicity:  Recurrent Context:  Weak, nonverbal, labs showed hypernatremia Relieved by:  Nothing Worsened by:  Nothing Associated symptoms: no fever, no rash and no rhinorrhea     Past Medical History  Diagnosis Date  . Thyroid cancer   . Ulcer     chronic  . Depression   . Vitamin D deficiency   . Anxiety   . Malaise and fatigue   . Osteoarthritis   . Kidney disease   . Pneumonia   . Dementia   . Alzheimer disease   . Hypothyroidism   . Bipolar 1 disorder   . Dehydration   . Hypertension   . Diabetes mellitus   . Pressure ulcer stage III   . Constipation   . Open-angle glaucoma   . Anemia   . Peripheral neuropathy   . Hypertensive heart disease   . CKD (chronic kidney disease), stage III   . Osteoarthrosis   . Mixed hyperlipidemia    Past Surgical History  Procedure Laterality Date  . Esophagogastroduodenoscopy  02/23/2011    Procedure: ESOPHAGOGASTRODUODENOSCOPY (EGD);  Surgeon: Beryle Beams, MD;  Location: [facility] ENDOSCOPY;  Service: Endoscopy;  Laterality: N/A;  . Abdominal hysterectomy    . Foot surgery    . Retinal detachment surgery    . Thyroidectomy     Family History  Problem Relation Age of Onset  . Diabetes Brother    History  Substance Use Topics  . Smoking status: Never Smoker   . Smokeless tobacco: Not on file  . Alcohol Use: No   OB History    No data available     Review of  Systems  Unable to perform ROS: Patient nonverbal  Constitutional: Negative for fever.  HENT: Negative for rhinorrhea.   Skin: Negative for rash.      Allergies  Lithium; Penicillins; and Sulfa antibiotics  Home Medications   Prior to Admission medications   Medication Sig Start Date End Date Taking? Authorizing Provider  acetaminophen (TYLENOL) 325 MG tablet Take 2 tablets (650 mg total) by mouth every 6 (six) hours as needed for mild pain (or Fever >/= 101). 11/06/13   Robbie Lis, MD  antiseptic oral rinse (CPC / CETYLPYRIDINIUM CHLORIDE 0.05%) 0.05 % LIQD solution 7 mLs by Mouth Rinse route 2 times daily at 12 noon and 4 pm. 11/06/13   Robbie Lis, MD  atorvastatin (LIPITOR) 10 MG tablet Take 10 mg by mouth at bedtime.     Historical Provider, MD  bimatoprost (LUMIGAN) 0.03 % ophthalmic solution Place 1 drop into both eyes at bedtime.    Historical Provider, MD  bisacodyl (DULCOLAX) 10 MG suppository Place 1 suppository (10 mg total) rectally daily as needed for moderate constipation. 11/06/13   Robbie Lis, MD  calcium-vitamin D (OSCAL WITH D) 500-200 MG-UNIT per tablet Take 2 tablets by mouth daily.     Historical Provider, MD  chlorhexidine (PERIDEX) 0.12 % solution 15 mLs  by Mouth Rinse route 2 (two) times daily. 11/06/13   Robbie Lis, MD  cholecalciferol (VITAMIN D) 1000 UNITS tablet Take 1,000 Units by mouth daily.    Historical Provider, MD  desmopressin (DDAVP) 0.2 MG tablet Take 0.2-0.4 mg by mouth 3 (three) times daily. 0.4mg  at 1000, 0.2mg  at 1400, 0.4mg  at 2200.    Historical Provider, MD  diclofenac sodium (VOLTAREN) 1 % GEL Apply 4 g topically 4 (four) times daily. Apply to left shoulder and left knee    Historical Provider, MD  doxycycline (VIBRA-TABS) 100 MG tablet Take 1 tablet (100 mg total) by mouth 2 (two) times daily. 11/06/13   Robbie Lis, MD  feeding supplement, ENSURE COMPLETE, (ENSURE COMPLETE) LIQD Take 237 mLs by mouth 2 (two) times daily between  meals. 11/06/13   Robbie Lis, MD  hydrOXYzine (ATARAX/VISTARIL) 10 MG tablet Take 10 mg by mouth 2 (two) times daily as needed for anxiety. For Anxiety    Historical Provider, MD  lansoprazole (PREVACID) 30 MG capsule Take 30 mg by mouth 2 (two) times daily.    Historical Provider, MD  levothyroxine (SYNTHROID, LEVOTHROID) 75 MCG tablet Take 1 tablet (75 mcg total) by mouth daily before breakfast. 11/06/13   Robbie Lis, MD  Multiple Vitamin (MULTIVITAMIN) tablet Take 1 tablet by mouth daily.      Historical Provider, MD  Nutritional Supplements (NUTRI-DRINK PO) Take 120 mLs by mouth 3 (three) times daily.    Historical Provider, MD  OXYGEN Inhale 2 L into the lungs.    Historical Provider, MD  Paliperidone (INVEGA) 1.5 MG TB24 Take 1 tablet by mouth daily.    Historical Provider, MD  PARoxetine (PAXIL) 20 MG tablet Take 20 mg by mouth every morning.    Historical Provider, MD  polyethylene glycol (MIRALAX / GLYCOLAX) packet Take 17 g by mouth 2 (two) times daily.    Historical Provider, MD  polyethylene glycol (MIRALAX / GLYCOLAX) packet Take 17 g by mouth daily. 11/06/13   Robbie Lis, MD  sennosides-docusate sodium (SENOKOT-S) 8.6-50 MG tablet Take 2 tablets by mouth at bedtime.    Historical Provider, MD  traZODone (DESYREL) 50 MG tablet Take 0.5 tablets (25 mg total) by mouth at bedtime as needed (insomnia). 11/06/13   Robbie Lis, MD   BP 127/81 mmHg  Pulse 70  Temp(Src) 97.8 F (36.6 C) (Oral)  Resp 16  SpO2 100% Physical Exam  Constitutional: She appears well-developed and well-nourished. She appears listless. No distress.  HENT:  Head: Normocephalic and atraumatic.  Mouth/Throat: Oropharynx is clear and moist.  Eyes: EOM are normal. Pupils are equal, round, and reactive to light.  Neck: Normal range of motion. Neck supple.  Cardiovascular: Normal rate and regular rhythm.  Exam reveals no friction rub.   No murmur heard. Pulmonary/Chest: Effort normal and breath sounds  normal. No respiratory distress. She has no wheezes. She has no rales.  Abdominal: Soft. She exhibits no distension. There is no tenderness. There is no rebound.  Musculoskeletal: Normal range of motion. She exhibits no edema.  Neurological: She appears listless.  Skin: She is not diaphoretic.  Nursing note and vitals reviewed.   ED Course  Procedures (including critical care time) Labs Review Labs Reviewed  CBC WITH DIFFERENTIAL - Abnormal; Notable for the following:    HCT 50.4 (*)    MCV 109.3 (*)    MCHC 28.2 (*)    RDW 18.0 (*)    Platelets 126 (*)  All other components within normal limits  URINALYSIS, ROUTINE W REFLEX MICROSCOPIC  I-STAT CG4 LACTIC ACID, ED  I-STAT TROPOININ, ED    Imaging Review No results found.   EKG Interpretation   Date/Time:  Monday December 28 2013 20:31:05 EST Ventricular Rate:  72 PR Interval:  100 QRS Duration: 113 QT Interval:  401 QTC Calculation: 439 R Axis:   -34 Text Interpretation:  Sinus rhythm Short PR interval Left anterior  fascicular block Low voltage, extremity and precordial leads Abnormal  R-wave progression, late transition Nonspecific repol abnormality, lateral  leads Similar to previous Confirmed by Mingo Amber  MD, [place] ([id number]) on  12/28/2013 8:41:10 PM      Angiocath insertion Performed by: Osvaldo Shipper  Consent: Verbal consent obtained. Risks and benefits: risks, benefits and alternatives were discussed Time out: Immediately prior to procedure a "time out" was called to verify the correct patient, procedure, equipment, support staff and site/side marked as required.  Preparation: Patient was prepped and draped in the usual sterile fashion.  Vein Location: R AC  Yes Ultrasound Guided  Gauge: 20  Normal blood return and flush without difficulty Patient tolerance: Patient tolerated the procedure well with no immediate complications.     MDM   Final diagnoses:  Hypernatremia  Acute renal  failure, unspecified acute renal failure type  Dehydration    53F here with hypernatremia from her nursing home. Mostly nonverbal, occasionally a confused conversation at baseline. Here nonverbal, doesn't follow commands, eyes are closed. Sent for hypernatremia - hx of same.  Vitals ok. Will check labs. Istat chem-8 shows NA of 172. Plan for admission.     Evelina Bucy, MD 12/29/13 0000

## 2013-12-28 NOTE — ED Notes (Signed)
Bed: WA02 Expected date: 12/28/13 Expected time: 7:43 PM Means of arrival: Ambulance Comments: Abnormal labs

## 2013-12-28 NOTE — ED Notes (Signed)
Pt sent from [facility] d/t elevated Na of 167.  Pt is non-verbal-as per norm per EMS.  Pt is alert.

## 2013-12-28 NOTE — ED Notes (Signed)
[name] [phone] (daughter) POA

## 2013-12-28 NOTE — ED Notes (Signed)
Please see triage note

## 2013-12-29 ENCOUNTER — Encounter (HOSPITAL_COMMUNITY): Payer: Self-pay | Admitting: *Deleted

## 2013-12-29 ENCOUNTER — Inpatient Hospital Stay (HOSPITAL_COMMUNITY): Payer: Medicare Other

## 2013-12-29 DIAGNOSIS — N179 Acute kidney failure, unspecified: Secondary | ICD-10-CM

## 2013-12-29 DIAGNOSIS — D631 Anemia in chronic kidney disease: Secondary | ICD-10-CM | POA: Diagnosis present

## 2013-12-29 DIAGNOSIS — K5900 Constipation, unspecified: Secondary | ICD-10-CM

## 2013-12-29 DIAGNOSIS — F319 Bipolar disorder, unspecified: Secondary | ICD-10-CM | POA: Diagnosis not present

## 2013-12-29 DIAGNOSIS — G4700 Insomnia, unspecified: Secondary | ICD-10-CM | POA: Diagnosis present

## 2013-12-29 DIAGNOSIS — E860 Dehydration: Secondary | ICD-10-CM | POA: Diagnosis present

## 2013-12-29 DIAGNOSIS — E232 Diabetes insipidus: Secondary | ICD-10-CM

## 2013-12-29 DIAGNOSIS — N183 Chronic kidney disease, stage 3 (moderate): Secondary | ICD-10-CM | POA: Diagnosis present

## 2013-12-29 DIAGNOSIS — G9340 Encephalopathy, unspecified: Secondary | ICD-10-CM

## 2013-12-29 DIAGNOSIS — R52 Pain, unspecified: Secondary | ICD-10-CM | POA: Diagnosis not present

## 2013-12-29 DIAGNOSIS — Z66 Do not resuscitate: Secondary | ICD-10-CM | POA: Diagnosis present

## 2013-12-29 DIAGNOSIS — G309 Alzheimer's disease, unspecified: Secondary | ICD-10-CM | POA: Diagnosis present

## 2013-12-29 DIAGNOSIS — E43 Unspecified severe protein-calorie malnutrition: Secondary | ICD-10-CM

## 2013-12-29 DIAGNOSIS — E782 Mixed hyperlipidemia: Secondary | ICD-10-CM | POA: Diagnosis present

## 2013-12-29 DIAGNOSIS — Z515 Encounter for palliative care: Secondary | ICD-10-CM | POA: Diagnosis not present

## 2013-12-29 DIAGNOSIS — G9341 Metabolic encephalopathy: Secondary | ICD-10-CM | POA: Diagnosis present

## 2013-12-29 DIAGNOSIS — I10 Essential (primary) hypertension: Secondary | ICD-10-CM

## 2013-12-29 DIAGNOSIS — E870 Hyperosmolality and hypernatremia: Secondary | ICD-10-CM | POA: Diagnosis present

## 2013-12-29 DIAGNOSIS — R0600 Dyspnea, unspecified: Secondary | ICD-10-CM | POA: Diagnosis not present

## 2013-12-29 DIAGNOSIS — K219 Gastro-esophageal reflux disease without esophagitis: Secondary | ICD-10-CM | POA: Diagnosis present

## 2013-12-29 DIAGNOSIS — I1310 Hypertensive heart and chronic kidney disease without heart failure, with stage 1 through stage 4 chronic kidney disease, or unspecified chronic kidney disease: Secondary | ICD-10-CM | POA: Diagnosis present

## 2013-12-29 DIAGNOSIS — E876 Hypokalemia: Secondary | ICD-10-CM | POA: Diagnosis present

## 2013-12-29 DIAGNOSIS — R0681 Apnea, not elsewhere classified: Secondary | ICD-10-CM | POA: Diagnosis present

## 2013-12-29 DIAGNOSIS — E785 Hyperlipidemia, unspecified: Secondary | ICD-10-CM

## 2013-12-29 DIAGNOSIS — E559 Vitamin D deficiency, unspecified: Secondary | ICD-10-CM | POA: Diagnosis present

## 2013-12-29 DIAGNOSIS — H409 Unspecified glaucoma: Secondary | ICD-10-CM | POA: Diagnosis present

## 2013-12-29 DIAGNOSIS — E119 Type 2 diabetes mellitus without complications: Secondary | ICD-10-CM | POA: Diagnosis present

## 2013-12-29 DIAGNOSIS — Z7401 Bed confinement status: Secondary | ICD-10-CM | POA: Diagnosis not present

## 2013-12-29 DIAGNOSIS — E039 Hypothyroidism, unspecified: Secondary | ICD-10-CM | POA: Diagnosis present

## 2013-12-29 DIAGNOSIS — Z85850 Personal history of malignant neoplasm of thyroid: Secondary | ICD-10-CM | POA: Diagnosis not present

## 2013-12-29 DIAGNOSIS — F0280 Dementia in other diseases classified elsewhere without behavioral disturbance: Secondary | ICD-10-CM | POA: Diagnosis present

## 2013-12-29 DIAGNOSIS — R627 Adult failure to thrive: Secondary | ICD-10-CM | POA: Diagnosis present

## 2013-12-29 DIAGNOSIS — F3130 Bipolar disorder, current episode depressed, mild or moderate severity, unspecified: Secondary | ICD-10-CM | POA: Diagnosis present

## 2013-12-29 LAB — CBC
HCT: 53.5 % — ABNORMAL HIGH (ref 36.0–46.0)
Hemoglobin: 14.5 g/dL (ref 12.0–15.0)
MCH: 30.1 pg (ref 26.0–34.0)
MCHC: 27.1 g/dL — ABNORMAL LOW (ref 30.0–36.0)
MCV: 111.2 fL — ABNORMAL HIGH (ref 78.0–100.0)
PLATELETS: 142 10*3/uL — AB (ref 150–400)
RBC: 4.81 MIL/uL (ref 3.87–5.11)
RDW: 17.9 % — ABNORMAL HIGH (ref 11.5–15.5)
WBC: 7.4 10*3/uL (ref 4.0–10.5)

## 2013-12-29 LAB — BASIC METABOLIC PANEL
BUN: 62 mg/dL — ABNORMAL HIGH (ref 6–23)
BUN: 52 mg/dL — ABNORMAL HIGH (ref 6–23)
BUN: 49 mg/dL — ABNORMAL HIGH (ref 6–23)
CALCIUM: 9.2 mg/dL (ref 8.4–10.5)
CALCIUM: 9 mg/dL (ref 8.4–10.5)
CO2: 28 mmol/L (ref 19–32)
CO2: 27 mmol/L (ref 19–32)
CO2: 27 mmol/L (ref 19–32)
Calcium: 8.9 mg/dL (ref 8.4–10.5)
Chloride: >130 mEq/L (ref 96–112)
Creatinine, Ser: 1.74 mg/dL — ABNORMAL HIGH (ref 0.50–1.10)
Creatinine, Ser: 1.48 mg/dL — ABNORMAL HIGH (ref 0.50–1.10)
Creatinine, Ser: 1.49 mg/dL — ABNORMAL HIGH (ref 0.50–1.10)
GFR calc Af Amer: 31 mL/min — ABNORMAL LOW (ref >90)
GFR calc Af Amer: 38 mL/min — ABNORMAL LOW (ref >90)
GFR calc non Af Amer: 27 mL/min — ABNORMAL LOW (ref >90)
GFR calc non Af Amer: 33 mL/min — ABNORMAL LOW (ref >90)
GFR, EST AFRICAN AMERICAN: 38 mL/min — AB (ref >90)
GFR, EST NON AFRICAN AMERICAN: 33 mL/min — AB (ref >90)
Glucose, Bld: 101 mg/dL — ABNORMAL HIGH (ref 70–99)
Glucose, Bld: 91 mg/dL (ref 70–99)
Glucose, Bld: 153 mg/dL — ABNORMAL HIGH (ref 70–99)
POTASSIUM: 3.7 mmol/L (ref 3.5–5.1)
Potassium: 3.2 mmol/L — ABNORMAL LOW (ref 3.5–5.1)
Potassium: 3.6 mmol/L (ref 3.5–5.1)
Sodium: 174 mmol/L (ref 135–145)
Sodium: 171 mmol/L (ref 135–145)
Sodium: 166 mmol/L (ref 135–145)

## 2013-12-29 LAB — BLOOD GAS, ARTERIAL
Acid-base deficit: 1.3 mmol/L (ref 0.0–2.0)
Bicarbonate: 23.3 mEq/L (ref 20.0–24.0)
Drawn by: 307971
O2 Content: 2 L/min
O2 Saturation: 98 %
PATIENT TEMPERATURE: 98.6
PH ART: 7.375 (ref 7.350–7.450)
TCO2: 20.7 mmol/L (ref 0–100)
pCO2 arterial: 40.8 mmHg (ref 35.0–45.0)
pO2, Arterial: 112 mmHg — ABNORMAL HIGH (ref 80.0–100.0)

## 2013-12-29 LAB — COMPREHENSIVE METABOLIC PANEL
ALT: 6 U/L (ref 0–35)
ANION GAP: 17 — AB (ref 5–15)
AST: 19 U/L (ref 0–37)
Albumin: 3.3 g/dL — ABNORMAL LOW (ref 3.5–5.2)
Alkaline Phosphatase: 102 U/L (ref 39–117)
BUN: 65 mg/dL — AB (ref 6–23)
CALCIUM: 9.6 mg/dL (ref 8.4–10.5)
CO2: 26 mmol/L (ref 19–32)
Chloride: 129 mEq/L — ABNORMAL HIGH (ref 96–112)
Creatinine, Ser: 1.83 mg/dL — ABNORMAL HIGH (ref 0.50–1.10)
GFR calc non Af Amer: 25 mL/min — ABNORMAL LOW (ref >90)
GFR, EST AFRICAN AMERICAN: 30 mL/min — AB (ref >90)
GLUCOSE: 97 mg/dL (ref 70–99)
Potassium: 3.6 mEq/L — ABNORMAL LOW (ref 3.7–5.3)
Sodium: 172 mEq/L (ref 137–147)
TOTAL PROTEIN: 7.1 g/dL (ref 6.0–8.3)
Total Bilirubin: 0.5 mg/dL (ref 0.3–1.2)

## 2013-12-29 LAB — GLUCOSE, CAPILLARY
GLUCOSE-CAPILLARY: 66 mg/dL — AB (ref 70–99)
Glucose-Capillary: 116 mg/dL — ABNORMAL HIGH (ref 70–99)

## 2013-12-29 LAB — MRSA PCR SCREENING: MRSA by PCR: NEGATIVE

## 2013-12-29 LAB — MAGNESIUM: Magnesium: 2.9 mg/dL — ABNORMAL HIGH (ref 1.5–2.5)

## 2013-12-29 MED ORDER — DEXTROSE 50 % IV SOLN
INTRAVENOUS | Status: AC
Start: 2013-12-29 — End: 2013-12-30
  Filled 2013-12-29: qty 50

## 2013-12-29 MED ORDER — HEPARIN SODIUM (PORCINE) 5000 UNIT/ML IJ SOLN
5000.0000 [IU] | Freq: Three times a day (TID) | INTRAMUSCULAR | Status: DC
  Administered 2013-12-29 – 2013-12-30 (×4): 5000 [IU] via SUBCUTANEOUS
  Filled 2013-12-29 (×5): qty 1

## 2013-12-29 MED ORDER — SODIUM CHLORIDE 0.45 % IV SOLN
INTRAVENOUS | Status: DC
  Administered 2013-12-29 (×2): via INTRAVENOUS

## 2013-12-29 MED ORDER — PAROXETINE HCL 20 MG PO TABS
20.0000 mg | ORAL_TABLET | Freq: Every day | ORAL | Status: DC
  Administered 2013-12-30: 20 mg via ORAL
  Filled 2013-12-29 (×2): qty 1

## 2013-12-29 MED ORDER — LATANOPROST 0.005 % OP SOLN
1.0000 [drp] | Freq: Every day | OPHTHALMIC | Status: DC
  Administered 2013-12-29 – 2014-01-01 (×4): 1 [drp] via OPHTHALMIC
  Filled 2013-12-29: qty 2.5

## 2013-12-29 MED ORDER — SODIUM CHLORIDE 0.9 % IJ SOLN
3.0000 mL | Freq: Two times a day (BID) | INTRAMUSCULAR | Status: DC
  Administered 2013-12-29 – 2014-01-01 (×7): 3 mL via INTRAVENOUS

## 2013-12-29 MED ORDER — PALIPERIDONE ER 1.5 MG PO TB24: 1.0000 | ORAL_TABLET | Freq: Every day | ORAL | Status: DC

## 2013-12-29 MED ORDER — PANTOPRAZOLE SODIUM 40 MG IV SOLR
40.0000 mg | Freq: Every day | INTRAVENOUS | Status: DC
  Administered 2013-12-30: 40 mg via INTRAVENOUS
  Filled 2013-12-29: qty 40

## 2013-12-29 MED ORDER — POTASSIUM CHLORIDE 20 MEQ/15ML (10%) PO SOLN
40.0000 meq | Freq: Once | ORAL | Status: DC
Start: 2013-12-29 — End: 2014-01-02

## 2013-12-29 MED ORDER — HYDROXYZINE HCL 10 MG PO TABS
10.0000 mg | ORAL_TABLET | Freq: Two times a day (BID) | ORAL | Status: DC | PRN
  Filled 2013-12-29: qty 1

## 2013-12-29 MED ORDER — ENSURE COMPLETE PO LIQD
237.0000 mL | Freq: Two times a day (BID) | ORAL | Status: DC
  Administered 2013-12-30: 237 mL via ORAL

## 2013-12-29 MED ORDER — ONDANSETRON HCL 4 MG/2ML IJ SOLN: 4.0000 mg | Freq: Four times a day (QID) | INTRAMUSCULAR | Status: DC | PRN

## 2013-12-29 MED ORDER — LEVOTHYROXINE SODIUM 100 MCG IV SOLR
37.5000 ug | Freq: Every day | INTRAVENOUS | Status: DC
  Administered 2013-12-29 – 2013-12-30 (×2): 37.5 ug via INTRAVENOUS
  Filled 2013-12-29 (×3): qty 5

## 2013-12-29 MED ORDER — TRAZODONE HCL 50 MG PO TABS: 25.0000 mg | ORAL_TABLET | Freq: Every evening | ORAL | Status: DC | PRN

## 2013-12-29 MED ORDER — BISACODYL 10 MG RE SUPP: 10.0000 mg | Freq: Every day | RECTAL | Status: DC | PRN

## 2013-12-29 MED ORDER — LEVOTHYROXINE SODIUM 75 MCG PO TABS
75.0000 ug | ORAL_TABLET | Freq: Every day | ORAL | Status: DC
  Filled 2013-12-29: qty 1

## 2013-12-29 MED ORDER — POTASSIUM CL IN DEXTROSE 5% 20 MEQ/L IV SOLN
20.0000 meq | INTRAVENOUS | Status: DC
  Administered 2013-12-29 – 2013-12-30 (×2): 20 meq via INTRAVENOUS
  Filled 2013-12-29 (×4): qty 1000

## 2013-12-29 MED ORDER — PALIPERIDONE ER 1.5 MG PO TB24
1.0000 | ORAL_TABLET | Freq: Every day | ORAL | Status: DC
  Filled 2013-12-29: qty 1

## 2013-12-29 MED ORDER — PANTOPRAZOLE SODIUM 20 MG PO TBEC
20.0000 mg | DELAYED_RELEASE_TABLET | Freq: Every day | ORAL | Status: DC
  Filled 2013-12-29: qty 1

## 2013-12-29 MED ORDER — ONDANSETRON HCL 4 MG PO TABS: 4.0000 mg | ORAL_TABLET | Freq: Four times a day (QID) | ORAL | Status: DC | PRN

## 2013-12-29 MED ORDER — DICLOFENAC SODIUM 1 % TD GEL
4.0000 g | Freq: Four times a day (QID) | TRANSDERMAL | Status: DC
  Administered 2013-12-29 – 2013-12-30 (×5): 4 g via TOPICAL
  Filled 2013-12-29 (×2): qty 100

## 2013-12-29 MED ORDER — ATORVASTATIN CALCIUM 10 MG PO TABS
10.0000 mg | ORAL_TABLET | Freq: Every day | ORAL | Status: DC
  Filled 2013-12-29: qty 1

## 2013-12-29 MED ORDER — CHLORHEXIDINE GLUCONATE 0.12 % MT SOLN
15.0000 mL | Freq: Two times a day (BID) | OROMUCOSAL | Status: DC
  Administered 2013-12-29 – 2014-01-04 (×9): 15 mL via OROMUCOSAL
  Filled 2013-12-29 (×16): qty 15

## 2013-12-29 MED ORDER — DESMOPRESSIN ACETATE 0.2 MG PO TABS
0.4000 mg | ORAL_TABLET | Freq: Two times a day (BID) | ORAL | Status: DC
  Administered 2013-12-30: 0.4 mg via ORAL
  Filled 2013-12-29 (×5): qty 2

## 2013-12-29 MED ORDER — PRO-STAT SUGAR FREE PO LIQD
30.0000 mL | Freq: Three times a day (TID) | ORAL | Status: DC
Start: 2013-12-29 — End: 2013-12-30
  Administered 2013-12-30: 30 mL via ORAL
  Filled 2013-12-29 (×8): qty 30

## 2013-12-29 NOTE — Progress Notes (Signed)
Sodium reported by lab to be 171.  MD aware that sodium levels are critical.   MD currently with patient and family. Will continue to monitor.

## 2013-12-29 NOTE — H&P (Signed)
[facility] History and Physical  Gracious Renken DUK:[id number] DOB: 30-Nov-1934 DOA: 12/28/2013  Referring physician: Dr Mingo Amber Gabriel Cirri  PCP: Hennie Duos, MD   Chief Complaint: Hypernatremia  HPI: [name] is a 79 y.o. female  Per report from EMS and NH staff pt was noted to have hypnatremia to the 157s. This is a chronic ongoing issue for the pt. Pt is weak and non-verbal but seems to be getting weaker as of lat at the nursing home. Eating less as of late per NH staff   Review of Systems:  Pt Severely demented and non-verbal at time of admission   Past Medical History  Diagnosis Date  . Thyroid cancer   . Ulcer     chronic  . Depression   . Vitamin D deficiency   . Anxiety   . Malaise and fatigue   . Osteoarthritis   . Kidney disease   . Pneumonia   . Dementia   . Alzheimer disease   . Hypothyroidism   . Bipolar 1 disorder   . Dehydration   . Hypertension   . Diabetes mellitus   . Pressure ulcer stage III   . Constipation   . Open-angle glaucoma   . Anemia   . Peripheral neuropathy   . Hypertensive heart disease   . CKD (chronic kidney disease), stage III   . Osteoarthrosis   . Mixed hyperlipidemia    Past Surgical History  Procedure Laterality Date  . Esophagogastroduodenoscopy  02/23/2011    Procedure: ESOPHAGOGASTRODUODENOSCOPY (EGD);  Surgeon: Beryle Beams, MD;  Location: [facility] ENDOSCOPY;  Service: Endoscopy;  Laterality: N/A;  . Abdominal hysterectomy    . Foot surgery    . Retinal detachment surgery    . Thyroidectomy     Social History:  reports that she has never smoked. She does not have any smokeless tobacco history on file. She reports that she does not drink alcohol or use illicit drugs.  Allergies  Allergen Reactions  . Lithium     NOTED ON MAR  . Penicillins     NOTED ON MAR  . Sulfa Antibiotics     NOTED ON MAR    Family History  Problem Relation Age of Onset  . Diabetes Brother      Prior to Admission medications    Medication Sig Start Date End Date Taking? Authorizing Provider  Amino Acids-Protein Hydrolys (FEEDING SUPPLEMENT, PRO-STAT SUGAR FREE 64,) LIQD Take 30 mLs by mouth 3 (three) times daily with meals.   Yes Historical Provider, MD  antiseptic oral rinse (CPC / CETYLPYRIDINIUM CHLORIDE 0.05%) 0.05 % LIQD solution 7 mLs by Mouth Rinse route 2 times daily at 12 noon and 4 pm. 11/06/13  Yes Robbie Lis, MD  atorvastatin (LIPITOR) 10 MG tablet Take 10 mg by mouth at bedtime.    Yes Historical Provider, MD  bimatoprost (LUMIGAN) 0.03 % ophthalmic solution Place 1 drop into both eyes at bedtime.   Yes Historical Provider, MD  calcium-vitamin D (OSCAL WITH D) 500-200 MG-UNIT per tablet Take 2 tablets by mouth daily.    Yes Historical Provider, MD  chlorhexidine (PERIDEX) 0.12 % solution 15 mLs by Mouth Rinse route 2 (two) times daily. 11/06/13  Yes Robbie Lis, MD  cholecalciferol (VITAMIN D) 1000 UNITS tablet Take 1,000 Units by mouth daily.   Yes Historical Provider, MD  desmopressin (DDAVP) 0.2 MG tablet Take 0.4 mg by mouth 2 (two) times daily.    Yes Historical Provider, MD  diclofenac  sodium (VOLTAREN) 1 % GEL Apply 4 g topically 4 (four) times daily. Apply to left shoulder and left knee   Yes Historical Provider, MD  feeding supplement, ENSURE COMPLETE, (ENSURE COMPLETE) LIQD Take 237 mLs by mouth 2 (two) times daily between meals. 11/06/13  Yes Robbie Lis, MD  lansoprazole (PREVACID) 30 MG capsule Take 30 mg by mouth 2 (two) times daily.   Yes Historical Provider, MD  levothyroxine (SYNTHROID, LEVOTHROID) 75 MCG tablet Take 1 tablet (75 mcg total) by mouth daily before breakfast. 11/06/13  Yes Robbie Lis, MD  LUMIGAN 0.01 % SOLN Place 1 drop into both eyes at bedtime. 12/07/13  Yes Historical Provider, MD  Multiple Vitamin (MULTIVITAMIN) tablet Take 1 tablet by mouth daily.     Yes Historical Provider, MD  PARoxetine (PAXIL) 20 MG tablet Take 20 mg by mouth every morning.   Yes Historical  Provider, MD  polyethylene glycol (MIRALAX / GLYCOLAX) packet Take 17 g by mouth daily. 11/06/13  Yes Robbie Lis, MD  vitamin C (ASCORBIC ACID) 500 MG tablet Take 500 mg by mouth daily.   Yes Historical Provider, MD  acetaminophen (TYLENOL) 325 MG tablet Take 2 tablets (650 mg total) by mouth every 6 (six) hours as needed for mild pain (or Fever >/= 101). 11/06/13   Robbie Lis, MD  bisacodyl (DULCOLAX) 10 MG suppository Place 1 suppository (10 mg total) rectally daily as needed for moderate constipation. 11/06/13   Robbie Lis, MD  doxycycline (VIBRA-TABS) 100 MG tablet Take 1 tablet (100 mg total) by mouth 2 (two) times daily. Patient not taking: Reported on 12/28/2013 11/06/13   Robbie Lis, MD  hydrOXYzine (ATARAX/VISTARIL) 10 MG tablet Take 10 mg by mouth 2 (two) times daily as needed for anxiety. For Anxiety    Historical Provider, MD  OXYGEN Inhale 2 L into the lungs.    Historical Provider, MD  Paliperidone (INVEGA) 1.5 MG TB24 Take 1 tablet by mouth daily.    Historical Provider, MD  sennosides-docusate sodium (SENOKOT-S) 8.6-50 MG tablet Take 2 tablets by mouth at bedtime.    Historical Provider, MD  traZODone (DESYREL) 50 MG tablet Take 0.5 tablets (25 mg total) by mouth at bedtime as needed (insomnia). 11/06/13   Robbie Lis, MD   Physical Exam: Filed Vitals:   12/28/13 2003 12/28/13 2211 12/29/13 0041 12/29/13 0110  BP: 127/81 121/82 130/83 137/80  Pulse: 70 75 73 72  Temp: 97.8 F (36.6 C)   97.6 F (36.4 C)  TempSrc: Oral   Axillary  Resp: 16 22 24 16   Weight:    48.943 kg (107 lb 14.4 oz)  SpO2: 100% 97% 100% 100%    Wt Readings from Last 3 Encounters:  12/29/13 48.943 kg (107 lb 14.4 oz)  11/01/13 55.43 kg (122 lb 3.2 oz)  08/26/13 61.236 kg (135 lb)    General:  Appears calm and comfortable Eyes: Dry w/ conjunctival injection bilaterally ENT: Dry mucus membranes Neck:  no LAD, masses or thyromegaly Cardiovascular:  RRR, II/VI systolic murmur . No LE  edema. Telemetry:  SR, no arrhythmias  Respiratory:  CTA bilaterally, no w/r/r. Normal respiratory effort. Abdomen:  soft, ntnd Skin:  no rash or induration seen on limited exam Musculoskeletal:  grossly normal tone BUE/BLE Psychiatric: PT responds w/ inaudible words or w/ a blank stare to questions. Will not follow commands physically  Neurologic: UEs in fixed flexion. Retracts after pulling limbs into extension.  Labs on Admission:  Basic Metabolic Panel:  Recent Labs Lab 12/28/13 2307  NA 172*  K 3.6*  CL 129*  CO2 26  GLUCOSE 97  BUN 65*  CREATININE 1.83*  CALCIUM 9.6   Liver Function Tests:  Recent Labs Lab 12/28/13 2307  AST 19  ALT 6  ALKPHOS 102  BILITOT 0.5  PROT 7.1  ALBUMIN 3.3*   No results for input(s): LIPASE, AMYLASE in the last 168 hours. No results for input(s): AMMONIA in the last 168 hours. CBC:  Recent Labs Lab 12/28/13 2023  WBC 7.8  NEUTROABS 5.3  HGB 14.2  HCT 50.4*  MCV 109.3*  PLT 126*   Cardiac Enzymes: No results for input(s): CKTOTAL, CKMB, CKMBINDEX, TROPONINI in the last 168 hours.  BNP (last 3 results)  Recent Labs  05/07/13 1300  PROBNP 118.8   CBG: No results for input(s): GLUCAP in the last 168 hours.  Radiological Exams on Admission: No results found.  EKG: Independently reviewed. NSR, LAFB, no sign of ACS  Assessment/Plan Principal Problem:   Hypernatremia Active Problems:   Essential hypertension, benign   Diabetes insipidus -  lithium-induced   GERD (gastroesophageal reflux disease)   Dyslipidemia   Anemia in chronic kidney disease   Dehydration   Bipolar 1 disorder, depressed   Alzheimer's disease   Chronic constipation   Protein-calorie malnutrition, severe   AKI (acute kidney injury)  Hypernatremia: Likely secondary to profound dehydration. Chronic condition for pt w/ similar admission in October. Anticipate this is largely from the pts failure to self hydrate given likely dementia  progression. Will treat as chronic hypernatremia and will require slow correction - Admit - tele - 1/2 NS  - BMET Q6, correction goal of no more than 47mEq drop in 24 hours  - Nursing swallow study  AKI: Cr 1.83s on admission. Baseline 1.1. Likely from dehydration.  - IVF and BMETs   Diabetes Insipidus:  - continue DDAVP - CBGs  Hypothyroid: - continue synthroid  Dry eyes:  Continue home Latanoprost  Bipolar 1:  - continue home Invega, Paxil - Trazodone  GERD:  - continue ppi  HLD:" Continue lipitor  Polypharmacy: likely little benefit to many of the medicaitons on pts list as pt becomes increasingly withdrawn  - consider weaning off medications in outpt setting.   Dementia/Social: Pt likely progressing in her disease state as she is not drinking or eating much at all, when even prompted.  - ENsure between meals - Consider clarifying goals of care w/ family during admission.  - +/- palliative care consult    Code Status: FULL DVT Prophylaxis: Hep Family Communication: Discussed case w/ Nursing Home staff  Disposition Plan: pending improvement   [name], [facility] [email or web address] Password [email or web address]

## 2013-12-29 NOTE — Progress Notes (Signed)
[facility]                                                                              Patient Demographics  [name], is a 79 y.o. female, DOB - Apr 01, 1934, MCN:[id number]  Admit date - 12/28/2013   Admitting Physician Waldemar Dickens, MD  Outpatient Primary MD for the patient is Hennie Duos, MD  LOS - 1   Chief Complaint  Patient presents with  . Abnormal Lab    Ma 167      HPI on 12/29/2013 by Dr. Linna Darner [name] is a 79 y.o. Female.  Per report from EMS and NH staff pt was noted to have hypnatremia to the 157s. This is a chronic ongoing issue for the pt. Pt is weak and non-verbal but seems to be getting weaker as of lat at the nursing home. Eating less as of late per NH staff.  Assessment & Plan   Hypernatremia and hyperchloremia -Possibly secondary to dehydration vs poor oral intake -Has had chronic hypernatremia in past (last hospital admission in 10/2013) -Will continue IVF and monitoring BMP  Acute kidney injury -Baseline Cr 1.1, upon admission 1.83 -Likely secondary dehydration  Diabetes insipidus -Continue DDAVP and monitor CBGs  Hypothyroid  -Continue synthroid  Bipolar disorder/depression -Continue Invega, paxil  Insomnia -Continue trazodone  GERD -Continue PPI  Hyperlipidemia -Continue statin  Hypokalemia -Will replace and continue to monitor  Polypharmacy -reviewed meds with pharmacy  Dementia/Social  -possible progression of her dementia (per daughter patient's baseline has been declining over the past several months) -Discussed palliative care with the daughter and code status.  She does not want to change code status at this time and would like to discuss this with her sister-in-law.    Code Status: Full  Family Communication: Daughter via phone  Disposition Plan: Admitted  Time Spent in minutes   30 minutes  Procedures  None  Consults   Palliative care  DVT Prophylaxis  heparin  Lab Results    Component Value Date   PLT 142* 12/29/2013    Medications  Scheduled Meds: . atorvastatin  10 mg Oral QHS  . chlorhexidine  15 mL Mouth Rinse BID  . desmopressin  0.4 mg Oral BID  . diclofenac sodium  4 g Topical QID  . feeding supplement (PRO-STAT SUGAR FREE 64)  30 mL Oral TID WC  . heparin  5,000 Units Subcutaneous 3 times per day  . latanoprost  1 drop Both Eyes QHS  . levothyroxine  75 mcg Oral QAC breakfast  . Paliperidone  1 tablet Oral QHS  . pantoprazole  20 mg Oral Daily  . PARoxetine  20 mg Oral Daily  . sodium chloride  3 mL Intravenous Q12H   Continuous Infusions: . sodium chloride 100 mL/hr at 12/29/13 0237   PRN Meds:.bisacodyl, hydrOXYzine, ondansetron **OR** ondansetron (ZOFRAN) IV, traZODone  Antibiotics    Anti-infectives    None        Subjective:   [name] seen and examined today.  Patient has dementia, and unresponsive to questions.  Per daughter, via phone, this has been the "norm" for the past several months.   Objective:  Filed Vitals:   12/28/13 2211 12/29/13 0041 12/29/13 0110 12/29/13 0538  BP: 121/82 130/83 137/80 107/69  Pulse: 75 73 72 64  Temp:   97.6 F (36.4 C) 98.4 F (36.9 C)  TempSrc:   Axillary Rectal  Resp: 22 24 16 16   Weight:   48.943 kg (107 lb 14.4 oz)   SpO2: 97% 100% 100% 100%    Wt Readings from Last 3 Encounters:  12/29/13 48.943 kg (107 lb 14.4 oz)  11/01/13 55.43 kg (122 lb 3.2 oz)  08/26/13 61.236 kg (135 lb)     Intake/Output Summary (Last 24 hours) at 12/29/13 1244 Last data filed at 12/29/13 0700  Gross per 24 hour  Intake 438.33 ml  Output      0 ml  Net 438.33 ml    Exam  General: Well developed, thin, NAD, appears stated age  24: NCAT, mucous membranes dry  Cardiovascular: S1 S2 auscultated, RRR  Respiratory: Clear to auscultation bilaterally with equal chest rise  Abdomen: Soft, nontender, nondistended, + bowel sounds  Extremities: warm dry without cyanosis clubbing or  edema  Neuro: unable to assess  Data Review   Micro Results Recent Results (from the past 240 hour(s))  MRSA PCR Screening     Status: None   Collection Time: 12/29/13 10:59 AM  Result Value Ref Range Status   MRSA by PCR NEGATIVE NEGATIVE Final    Comment:        The GeneXpert MRSA Assay (FDA approved for NASAL specimens only), is one component of a comprehensive MRSA colonization surveillance program. It is not intended to diagnose MRSA infection nor to guide or monitor treatment for MRSA infections.     Radiology Reports No results found.  CBC  Recent Labs Lab 12/28/13 2023 12/29/13 0410  WBC 7.8 7.4  HGB 14.2 14.5  HCT 50.4* 53.5*  PLT 126* 142*  MCV 109.3* 111.2*  MCH 30.8 30.1  MCHC 28.2* 27.1*  RDW 18.0* 17.9*  LYMPHSABS 1.7  --   MONOABS 0.6  --   EOSABS 0.1  --   BASOSABS 0.0  --     Chemistries   Recent Labs Lab 12/28/13 2307 12/29/13 0410  NA 172* 174*  K 3.6* 3.2*  CL 129* >130*  CO2 26 28  GLUCOSE 97 101*  BUN 65* 62*  CREATININE 1.83* 1.74*  CALCIUM 9.6 9.2  AST 19  --   ALT 6  --   ALKPHOS 102  --   BILITOT 0.5  --    ------------------------------------------------------------------------------------------------------------------ estimated creatinine clearance is 20.9 mL/min (by C-G formula based on Cr of 1.74). ------------------------------------------------------------------------------------------------------------------ No results for input(s): HGBA1C in the last 72 hours. ------------------------------------------------------------------------------------------------------------------ No results for input(s): CHOL, HDL, LDLCALC, TRIG, CHOLHDL, LDLDIRECT in the last 72 hours. ------------------------------------------------------------------------------------------------------------------ No results for input(s): TSH, T4TOTAL, T3FREE, THYROIDAB in the last 72 hours.  Invalid input(s):  FREET3 ------------------------------------------------------------------------------------------------------------------ No results for input(s): VITAMINB12, FOLATE, FERRITIN, TIBC, IRON, RETICCTPCT in the last 72 hours.  Coagulation profile No results for input(s): INR, PROTIME in the last 168 hours.  No results for input(s): DDIMER in the last 72 hours.  Cardiac Enzymes No results for input(s): CKMB, TROPONINI, MYOGLOBIN in the last 168 hours.  Invalid input(s): CK ------------------------------------------------------------------------------------------------------------------ Invalid input(s): POCBNP    Beuford Garcilazo D.O. on 12/29/2013 at 12:44 PM  Between 7am to 7pm - Pager - [phone]  After 7pm go to [email or web address] - password [email or web address]  And look for the night coverage person covering for me after hours  [place]  [phone]

## 2013-12-29 NOTE — Progress Notes (Signed)
PO medications not administered to patient today due to patient's mental status.  MD aware.  Will continue to monitor.

## 2013-12-29 NOTE — Progress Notes (Signed)
Pt's family would like to speak with someone about pt's code status. Paged midlevel awaiting call back.

## 2013-12-29 NOTE — Progress Notes (Signed)
PT lying in bed with fixed stare. Does not respond to verbal and painful stimulation. HR 60['s and SR, BP 128/78, Resp 2 with periods of apnea.  MD paged, RRT called and to bedside. Frannie Shedrick A

## 2013-12-29 NOTE — Progress Notes (Signed)
INITIAL NUTRITION ASSESSMENT  DOCUMENTATION CODES Per approved criteria  -Severe malnutrition in the context of chronic illness  Pt meets criteria for severe MALNUTRITION in the context of chronic illness as evidenced by moderate to severe fat and muscle wasting and 30% wt loss in the past year.  INTERVENTION: - Ensure Complete po BID, each supplement provides 350 kcal and 13 grams of protein - Diet downgraded to Dysphagia III/Heart Healthy - Pt needs feeding assist.  NUTRITION DIAGNOSIS: Inadequate oral intake related to dementia as evidenced by poor po intake.   Goal: Pt to meet >/= 90% of their estimated nutrition needs   Monitor:  Weigh trend, po intake, acceptance of supplements, labs  Reason for Assessment: MST  79 y.o. female  Admitting Dx: Hypernatremia  ASSESSMENT: 79 y.o. Female. Per report from EMS and NH staff pt was noted to have hypnatremia to the 157s. This is a chronic ongoing issue for the pt. Pt is weak and non-verbal but seems to be getting weaker as of lat at the nursing home. Eating less as of late per NH staff.  - Pt unresponsive to RD voice or touch. Per RN, pt has not been awake enough to eat or be fed. Pt with Dysphagia III diet in past visits. Will order nutritional supplements between meals for when pt is awake. Per Chart history, pt with 30% wt loss in the past year.   Nutrition Focused Physical Exam:  Subcutaneous Fat:  Orbital Region: moderate depletion Upper Arm Region: moderate depletion Thoracic and Lumbar Region: moderate depletion  Muscle:  Temple Region: severe depletion Clavicle Bone Region: moderate depletion Clavicle and Acromion Bone Region: moderate to severe depletion Scapular Bone Region: n/a Dorsal Hand: severe depletion Patellar Region: severe depletion Anterior Thigh Region: moderate depletion Posterior Calf Region: moderate depletion  Edema: none    Height: Ht Readings from Last 1 Encounters:  11/01/13 5\' 4"  (1.626  m)    Weight: Wt Readings from Last 1 Encounters:  12/29/13 107 lb 14.4 oz (48.943 kg)    Ideal Body Weight: 120 lbs  % Ideal Body Weight: 89%  Wt Readings from Last 10 Encounters:  12/29/13 107 lb 14.4 oz (48.943 kg)  11/01/13 122 lb 3.2 oz (55.43 kg)  08/26/13 135 lb (61.236 kg)  06/24/13 148 lb (67.132 kg)  01/21/13 152 lb (68.947 kg)  01/04/13 139 lb 15.9 oz (63.5 kg)  09/19/12 158 lb (71.668 kg)  07/30/12 164 lb (74.39 kg)  06/03/12 153 lb (69.4 kg)  04/11/12 154 lb (69.854 kg)    Usual Body Weight: 152 lbs  % Usual Body Weight: 71%  BMI:  Body mass index is 18.51 kg/(m^2).  Estimated Nutritional Needs: Kcal: 1500-1700 Protein: 75-85 g Fluid: 1.7 L/day  Skin: stage II pressure ulcer on sacrum  Diet Order: Diet Heart  EDUCATION NEEDS: -Education needs addressed   Intake/Output Summary (Last 24 hours) at 12/29/13 1312 Last data filed at 12/29/13 0700  Gross per 24 hour  Intake 438.33 ml  Output      0 ml  Net 438.33 ml    Last BM: 12/22   Labs:   Recent Labs Lab 12/28/13 2307 12/29/13 0410  NA 172* 174*  K 3.6* 3.2*  CL 129* >130*  CO2 26 28  BUN 65* 62*  CREATININE 1.83* 1.74*  CALCIUM 9.6 9.2  GLUCOSE 97 101*    CBG (last 3)  No results for input(s): GLUCAP in the last 72 hours.  Scheduled Meds: . atorvastatin  10 mg  Oral QHS  . chlorhexidine  15 mL Mouth Rinse BID  . desmopressin  0.4 mg Oral BID  . diclofenac sodium  4 g Topical QID  . feeding supplement (PRO-STAT SUGAR FREE 64)  30 mL Oral TID WC  . heparin  5,000 Units Subcutaneous 3 times per day  . latanoprost  1 drop Both Eyes QHS  . levothyroxine  75 mcg Oral QAC breakfast  . Paliperidone  1 tablet Oral QHS  . pantoprazole  20 mg Oral Daily  . PARoxetine  20 mg Oral Daily  . sodium chloride  3 mL Intravenous Q12H    Continuous Infusions: . sodium chloride 100 mL/hr at 12/29/13 2563    Past Medical History  Diagnosis Date  . Thyroid cancer   . Ulcer      chronic  . Depression   . Vitamin D deficiency   . Anxiety   . Malaise and fatigue   . Osteoarthritis   . Kidney disease   . Pneumonia   . Dementia   . Alzheimer disease   . Hypothyroidism   . Bipolar 1 disorder   . Dehydration   . Hypertension   . Diabetes mellitus   . Pressure ulcer stage III   . Constipation   . Open-angle glaucoma   . Anemia   . Peripheral neuropathy   . Hypertensive heart disease   . CKD (chronic kidney disease), stage III   . Osteoarthrosis   . Mixed hyperlipidemia     Past Surgical History  Procedure Laterality Date  . Esophagogastroduodenoscopy  02/23/2011    Procedure: ESOPHAGOGASTRODUODENOSCOPY (EGD);  Surgeon: Beryle Beams, MD;  Location: [facility] END[facility]COPY;  Service: Endoscopy;  Laterality: N/A;  . Abdominal hysterectomy    . Foot surgery    . Retinal detachment surgery    . Thyroidectomy      Laurette Schimke MS, RD, LDN

## 2013-12-29 NOTE — Care Management Note (Signed)
    Page 1 of 1   12/29/2013     2:14:33 PM CARE MANAGEMENT NOTE 12/29/2013  Patient:  [name], [name]   Account Number:  [id number]  Date Initiated:  12/29/2013  Documentation initiated by:  Dessa Phi  Subjective/Objective Assessment:   79 y/o f admitted w/hypernatremia.     Action/Plan:   From SNF.   Anticipated DC Date:  01/01/2014   Anticipated DC Plan:  [place]  CM consult      Choice offered to / List presented to:             Status of service:  In process, will continue to follow Medicare Important Message given?   (If response is "NO", the following Medicare IM given date fields will be blank) Date Medicare IM given:   Medicare IM given by:   Date Additional Medicare IM given:   Additional Medicare IM given by:    Discharge Disposition:    Per UR Regulation:  Reviewed for med. necessity/level of care/duration of stay  If discussed at [place] of Stay Meetings, dates discussed:    Comments:  12/29/13 Dessa Phi RN BSN NCM [phone] d/c plan return SNf-CSW already following.

## 2013-12-29 NOTE — Progress Notes (Addendum)
Called by RN to evaluate patient due to an episode of apnea and unresponsiveness. Upon arrival, rapid response RN present. Patient was somnolent but intermittently was able to follow one-step commands and stated "ouch" to protopathic stimuli.  Vital signs at the time of my arrival showed heart rate 77, respirations 10, blood pressure 137/86, 99% on 2 L  CV-RRR, no rub Lung-poor inspiratory effort but CTA Abd--soft/+BS/NT Ext--no rash, no edema  ABG ordered--7.37/40/112/23 on 2L CBG--66  1/2 amp of D5W given, pt a little more alert but remained somnolent-->recheck 116  Suspect the patient has a component of Cheyne-Stokes respirations as part of her metabolic encephalopathy. Order CT brain w/o contrast EKG Send patient to step down Start D5W, stop 1/2NS Check am cortisol although suspicion for adrenal insuff is quite low  Discussed with patient's daughter at bedside regarding code status.  She remains undecided at this time, but she is willing to speak with palliative medicine service.  Pt remains full code at this time.  Total time for encounter--60 min  DTat

## 2013-12-29 NOTE — Progress Notes (Signed)
Clinical Social Work Department BRIEF PSYCHOSOCIAL ASSESSMENT 12/29/2013  Patient:  [name], [name]     Account Number:  [id number]     Admit date:  12/28/2013  Clinical Social Worker:  Renold Genta  Date/Time:  12/29/2013 11:40 AM  Referred by:  Physician  Date Referred:  12/29/2013 Referred for  Other - See comment   Other Referral:   Admitted from: [facility] SNF   Interview type:  Family Other interview type:   patient's daughter, [name] via phone    PSYCHOSOCIAL DATA Living Status:  FACILITY Admitted from facility:  [place] Level of care:  [place] Primary support name:  Bary Richard (daughter) ph#: [phone]  [phone] Primary support relationship to patient:  CHILD, ADULT Degree of support available:   good    CURRENT CONCERNS Current Concerns  Post-Acute Placement   Other Concerns:    SOCIAL WORK ASSESSMENT / PLAN CSW received consult that patient was admitted from [facility].   Assessment/plan status:  Information/Referral to [employer] Other assessment/ plan:   Information/referral to community resources:   CSW completed FL2 and faxed information to [facility], confirmed with Atanza at [facility] that they would be able to take patient back when stable.    PATIENT'S/FAMILY'S RESPONSE TO PLAN OF CARE: Patient's daughter informed CSW that patient was at [facility] for long term care and that the plan is for her to return there at discharge.       Clinical Social Work Department BRIEF PSYCHOSOCIAL ASSESSMENT 12/29/2013  Patient:  [name], [name]     Account Number:  [id number]     Admit date:  12/28/2013  Clinical Social Worker:  Renold Genta  Date/Time:  12/29/2013 11:40 AM  Referred by:  Physician  Date Referred:  12/29/2013 Referred for  Other - See comment   Other Referral:   Admitted from: [facility] SNF   Interview type:  Family Other interview type:   patient's daughter,  [name] via phone    PSYCHOSOCIAL DATA Living Status:  FACILITY Admitted from facility:  [facility] Level of care:  [place] Primary support name:  Bary Richard (daughter) ph#: [phone]  [phone] Primary support relationship to patient:  CHILD, ADULT Degree of support available:   good    CURRENT CONCERNS Current Concerns  Post-Acute Placement   Other Concerns:    SOCIAL WORK ASSESSMENT / PLAN CSW received consult that patient was admitted from [facility].   Assessment/plan status:  Information/Referral to [employer] Other assessment/ plan:   Information/referral to community resources:   CSW completed FL2 and faxed information to [facility], confirmed with Atanza at [facility] that they would be able to take patient back when stable.    PATIENT'S/FAMILY'S RESPONSE TO PLAN OF CARE: Patient's daughter informed CSW that patient was at [facility] for long term care and that the plan is for her to return there at discharge.         Raynaldo Opitz, [facility] Clinical Social Worker cell #: [phone]

## 2013-12-29 NOTE — Evaluation (Signed)
Clinical/Bedside Swallow Evaluation Patient Details  Name: [name] MRN: [id number] Date of Birth: [birth year]  Today's Date: 12/29/2013 Time: 1035-1059 SLP Time Calculation (min) (ACUTE ONLY): 24 min  Past Medical History:  Past Medical History  Diagnosis Date  . Thyroid cancer   . Ulcer     chronic  . Depression   . Vitamin D deficiency   . Anxiety   . Malaise and fatigue   . Osteoarthritis   . Kidney disease   . Pneumonia   . Dementia   . Alzheimer disease   . Hypothyroidism   . Bipolar 1 disorder   . Dehydration   . Hypertension   . Diabetes mellitus   . Pressure ulcer stage III   . Constipation   . Open-angle glaucoma   . Anemia   . Peripheral neuropathy   . Hypertensive heart disease   . CKD (chronic kidney disease), stage III   . Osteoarthrosis   . Mixed hyperlipidemia    Past Surgical History:  Past Surgical History  Procedure Laterality Date  . Esophagogastroduodenoscopy  02/23/2011    Procedure: ESOPHAGOGASTRODUODENOSCOPY (EGD);  Surgeon: Beryle Beams, MD;  Location: [facility] ENDOSCOPY;  Service: Endoscopy;  Laterality: N/A;  . Abdominal hysterectomy    . Foot surgery    . Retinal detachment surgery    . Thyroidectomy     HPI:  79 yo female adm from SNF with AMS, diagnosed with hypernatremia.  Pt PMH + for dementia, thyroid cancer, GERD, bipolar d/o, dysphagia.  Pt has undergone endoscopy previously with findings of Zenker's diverticulum and hiatal hernia 02/23/2011.  Per review of chart, pt has been eating less recently.   She is on a puree/thin diet prior to admit per RN (RN states daughter informed her of premorbid diet).  Swallow eval ordered.    Assessment / Plan / Recommendation Clinical Impression  Currently pt's largest barrier to po intake is her lethargy and suspect behavior/pt decreased desire for po.  Limited CN exam as pt with decreased participation but she was able to seal lips tightly closed to indicate lack of desire for oral suction or  po.  No overt focal CN deficits apparent.    Delayed oral transiting/oral holding and delayed swallow response with small bolus of icecream provided.  Oral suctioning provided removing adhered white tinged secretions from oral cavity and icecream bolus residual.  Cough x1 noted after minimal intake, ? Due to aspiration of secretions/icecream residual.    Suspect pt's dysphagia, lack of intake likely contributing to dehydration/hypernatremia.   As this time, recommend pt be npo (adequate oral care).  Note plan for possible palliative referral and thus dependent on goals - comfort po diet may be indicated only when pt fully alert.  Pt was on puree diet prior to admit per RN.   Educated Rn to findings, recommendations and oral care provided.     Aspiration Risk  Severe    Diet Recommendation NPO (oral care)        Other  Recommendations     Follow Up Recommendations   (tbd)    Frequency and Duration min 2x/week  1 week   Pertinent Vitals/Pain Afebrile, decreased     Swallow Study Prior Functional Status   see hhx    General Date of Onset: 12/29/13 HPI: 79 yo female adm from SNF with AMS, diagnosed with hypernatremia.  Pt PMH + for dementia, thyroid cancer, GERD, bipolar d/o, dysphagia.  Pt has undergone endoscopy previously with findings of Zenker's diverticulum  and hiatal hernia 02/23/2011.  Per review of chart, pt has been eating less recently.   She is on a puree/thin diet prior to admit per RN (RN states daughter informed her of premorbid diet).  Swallow eval ordered.  Type of Study: Bedside swallow evaluation Previous Swallow Assessment: unknown besides endoscopy Diet Prior to this Study: Regular;Thin liquids Temperature Spikes Noted: No Respiratory Status: Nasal cannula History of Recent Intubation: No Behavior/Cognition: Lethargic;Hard of hearing;Doesn't follow directions Oral Cavity - Dentition: Adequate natural dentition Self-Feeding Abilities: Total assist Patient  Positioning: Upright in bed Baseline Vocal Quality: Low vocal intensity (during few times pt was verbalizing) Volitional Cough: Cognitively unable to elicit Volitional Swallow: Unable to elicit    Oral/Motor/Sensory Function Overall Oral Motor/Sensory Function:  (generalized weakness, no focal cn deficits, pt with limited participation)   [employer] chips: Not tested   Thin Liquid Thin Liquid: Not tested    Nectar Thick Nectar Thick Liquid: Not tested   Honey Thick Honey Thick Liquid: Not tested   Puree Puree: Impaired Oral Phase Impairments: Poor awareness of bolus Oral Phase Functional Implications: Right lateral sulci pocketing;Prolonged oral transit;Oral holding Pharyngeal Phase Impairments: Suspected delayed Swallow;Cough - Delayed Other Comments: icecream boluses x2 - small tsp amounts, pt able to seal lips tightly closed with oral suction attempts demonstrating good strength   Solid   GO    Solid: Not tested       Luanna Salk, [facility] SLP [phone]

## 2013-12-29 NOTE — ED Notes (Signed)
Spoke to pt's daughter and updated her on pt's status.  She reports pt was admitted to the hospital recently and had an elevated Na then but is not able to provide the level.  Mingo Amber EDP made aware

## 2013-12-29 NOTE — Progress Notes (Signed)
Pt with na+ of 166 and cl+ of >130. Called midlevel awaiting call back.

## 2013-12-30 DIAGNOSIS — E039 Hypothyroidism, unspecified: Secondary | ICD-10-CM

## 2013-12-30 DIAGNOSIS — Z515 Encounter for palliative care: Secondary | ICD-10-CM

## 2013-12-30 DIAGNOSIS — R0600 Dyspnea, unspecified: Secondary | ICD-10-CM

## 2013-12-30 DIAGNOSIS — R52 Pain, unspecified: Secondary | ICD-10-CM

## 2013-12-30 DIAGNOSIS — E870 Hyperosmolality and hypernatremia: Principal | ICD-10-CM

## 2013-12-30 DIAGNOSIS — Z66 Do not resuscitate: Secondary | ICD-10-CM

## 2013-12-30 DIAGNOSIS — G309 Alzheimer's disease, unspecified: Secondary | ICD-10-CM

## 2013-12-30 LAB — BASIC METABOLIC PANEL
BUN: 41 mg/dL — ABNORMAL HIGH (ref 6–23)
CO2: 27 mmol/L (ref 19–32)
Calcium: 9 mg/dL (ref 8.4–10.5)
Creatinine, Ser: 1.26 mg/dL — ABNORMAL HIGH (ref 0.50–1.10)
GFR calc non Af Amer: 40 mL/min — ABNORMAL LOW (ref >90)
GFR, EST AFRICAN AMERICAN: 46 mL/min — AB (ref >90)
Glucose, Bld: 147 mg/dL — ABNORMAL HIGH (ref 70–99)
POTASSIUM: 4.1 mmol/L (ref 3.5–5.1)
SODIUM: 161 mmol/L — AB (ref 135–145)

## 2013-12-30 LAB — TSH: TSH: 0.537 u[IU]/mL (ref 0.350–4.500)

## 2013-12-30 MED ORDER — PANTOPRAZOLE SODIUM 40 MG IV SOLR
40.0000 mg | INTRAVENOUS | Status: DC
  Administered 2013-12-30: 40 mg via INTRAVENOUS
  Filled 2013-12-30 (×2): qty 40

## 2013-12-30 MED ORDER — LORAZEPAM 2 MG/ML IJ SOLN: 1.0000 mg | INTRAMUSCULAR | Status: DC | PRN

## 2013-12-30 MED ORDER — MORPHINE SULFATE 2 MG/ML IJ SOLN
1.0000 mg | INTRAMUSCULAR | Status: DC | PRN
Start: 2013-12-30 — End: 2014-01-04
  Administered 2014-01-03: 1 mg via INTRAVENOUS
  Filled 2013-12-30: qty 1

## 2013-12-30 MED ORDER — LORAZEPAM 2 MG/ML IJ SOLN
1.0000 mg | INTRAMUSCULAR | Status: DC | PRN
  Administered 2013-12-31: 1 mg via INTRAVENOUS
  Filled 2013-12-30: qty 1

## 2013-12-30 NOTE — Progress Notes (Addendum)
Speech Language Pathology Treatment: Dysphagia  Patient Details Name: [name] MRN: [id number] DOB: [birth year] Today's Date: 12/30/2013 Time: 4098-1191 SLP Time Calculation (min) (ACUTE ONLY): 18 min  Assessment / Plan / Recommendation Clinical Impression  Pt demonstrates improved arousal and automaticity with POs today. Upon SLP arrival pt had just been repositioned and was awake. She verbalized "Hello" and was also able to state "I don't feel good" at end of session.   SLP provided primarily tactile cues (placing cup in hand, touching straw/spoon to lips) which pt responded to with automatic and sometimes eager intake of PO without evidence of aspiration. Pt fed herself 4 oz of water via cup and 4 oz of juice via straw and consumed 2 oz of ice cream with total assist. SLP paced intake, removing straw as needed to keep pt taking 1-2 sips at a time.   Expect that intake will be inconsistent and highly dependent on arousal and sensation of thirst. Recommend pt resume a dys 1 (puree) diet with thin liquids to allow staff opportunities to offer PO, though never forcing intake. If pt is alert and accepting spoon/straw/cup with gentle cues, she may consume PO. Risk of aspiration still moderate given inconsistent mentation and anticipate persistent difficulty with adequate intake.    HPI HPI: 79 yo female adm from SNF with AMS, diagnosed with hypernatremia.  Pt PMH + for dementia, thyroid cancer, GERD, bipolar d/o, dysphagia.  Pt has undergone endoscopy previously with findings of Zenker's diverticulum and hiatal hernia 02/23/2011.  Per review of chart, pt has been eating less recently.   She is on a puree/thin diet prior to admit per RN (RN states daughter informed her of premorbid diet).  Swallow eval ordered.    Pertinent Vitals Pain Assessment: Faces Faces Pain Scale: Hurts little more Pain Location:  (unable to state only "I dont feel good")  SLP Plan  Continue with current plan of care     Recommendations Diet recommendations: Dysphagia 1 (puree);Thin liquid Liquids provided via: Cup;Straw Medication Administration: Crushed with puree Supervision: Full supervision/cueing for compensatory strategies Compensations: Slow rate;Small sips/bites Postural Changes and/or Swallow Maneuvers: Seated upright 90 degrees              Oral Care Recommendations: Oral care BID Follow up Recommendations: Skilled Nursing facility Plan: Continue with current plan of care    GO    [facility], MA CCC-SLP [phone]  Lynann Beaver 12/30/2013, 10:32 AM

## 2013-12-30 NOTE — Progress Notes (Signed)
Pt c/o nosebleeds. Called midlevel awaiting call back.

## 2013-12-30 NOTE — Progress Notes (Signed)
Pt with na+ of 161 and cl of >130. Called midlevel to inform awaiting call back.

## 2013-12-30 NOTE — Progress Notes (Signed)
[facility]                                                                              Patient Demographics  [name], is a 79 y.o. female, DOB - November 20, 1934, BWI:[id number]  Admit date - 12/28/2013   Admitting Physician Waldemar Dickens, MD  Outpatient Primary MD for the patient is Hennie Duos, MD  LOS - 2   Chief Complaint  Patient presents with  . Abnormal Lab    Ma 167      HPI on 12/29/2013 by Dr. Linna Darner [name] is a 79 y.o. Female.  Per report from EMS and NH staff pt was noted to have hypnatremia to the 157s. This is a chronic ongoing issue for the pt. Pt is weak and non-verbal but seems to be getting weaker and not eating or drinking more lately. Sodium level up to 10's range and patient not eating, drinking or able to follow commands. At this moment decision is for full comfort care given overall picture and gray prognosis. Family has met with PC service and will like to pursuit residential hospice if she is a candidate for it.    Assessment & Plan   Hypernatremia and hyperchloremia -Secondary to dehydration, from poor oral intake -Has had chronic hypernatremia in past (last hospital admission in 10/2013) -has failed demonstrating capacity of hydration or appropriate intake/nutrition by herself  -family not interested in PEG tube or artificial nutrition -after [place] meeting; plan is for comfort care.  -will stop IVF's, no more labs -comfort feeding and PRN morphine/ativan  Acute kidney injury -Baseline Cr 1.1, upon admission 1.83 -Secondary dehydration -last Cr down to 1.26 -no more labs draws; comfort care  Diabetes insipidus -no more blood monitoring -will stop all non-essential meds designated to achieve/maintain comfort care  Hypothyroid  -unable to swallow properly -and given comfort measure, will stop synthroid   Bipolar disorder/depression -unable to properly take any meds by mouth -will discontinue invega, trazodone and  paxil -will use ativan PRN instead  Insomnia -will treat with ativan  GERD -will continue PPI  Hyperlipidemia -plan is for comfort care -will discontinue statins  Hypokalemia -repleted with IVF's -now no more IVF's and no more labs -full comfort care  Dementia/Social  -possible progression of her dementia (per daughter patient's baseline has been declining over the past several months prior to admission and with really poor quality of life) -Discussed palliative care with the daughter and code status.   -patient is now DNR/DNI and in agreement to pursuit full comfort care  Code Status: Full  Family Communication: Daughter at bedside  Disposition Plan: transfer out of stepdown; med-surg bed and depending next 24 hours move to residential hospice if appropriate  Time Spent in minutes   25 minutes  Procedures  None  Consults   Palliative care  DVT Prophylaxis  SCD's  Lab Results  Component Value Date   PLT 142* 12/29/2013    Medications  Scheduled Meds: . chlorhexidine  15 mL Mouth Rinse BID  . diclofenac sodium  4 g Topical QID  . latanoprost  1 drop Both Eyes QHS  . potassium chloride  40 mEq Oral Once  .  sodium chloride  3 mL Intravenous Q12H   Continuous Infusions:   PRN Meds:.bisacodyl, LORazepam, morphine injection, ondansetron **OR** ondansetron (ZOFRAN) IV  Antibiotics    Anti-infectives    None       Subjective:   [name] seen and examined today.  Patient has dementia, and even able to say some words; no much of interaction or ability to follow commands appreciated. Continue to experienced transient apnea episodes.   Objective:   Filed Vitals:   12/30/13 1100 12/30/13 1200 12/30/13 1300 12/30/13 1500  BP: 135/73  96/61 107/65  Pulse: 58  53 58  Temp:  97.8 F (36.6 C)    TempSrc:  Axillary    Resp: _0 Height:      Weight:      SpO2: 100%  100% 100%    Wt Readings from Last 3 Encounters:  12/29/13 49.5 kg (109 lb  2 oz)  11/01/13 55.43 kg (122 lb 3.2 oz)  08/26/13 61.236 kg (135 lb)     Intake/Output Summary (Last 24 hours) at 12/30/13 1629 Last data filed at 12/30/13 1405  Gross per 24 hour  Intake 2141.33 ml  Output      4 ml  Net 2137.33 ml    Exam  General: thin, NAD, appears stated age; not eye contact   HEENT: NCAT, mucous membranes dry  Cardiovascular: S1 S2 auscultated, RRR  Respiratory: Clear to auscultation bilaterally with equal chest rise  Abdomen: Soft, nontender, nondistended, + bowel sounds  Extremities: warm dry without cyanosis, clubbing or edema  Neuro: unable to follow commands; not moving her limbs or retracting with painful stimuli   Data Review   Micro Results Recent Results (from the past 240 hour(s))  MRSA PCR Screening     Status: None   Collection Time: 12/29/13 10:59 AM  Result Value Ref Range Status   MRSA by PCR NEGATIVE NEGATIVE Final    Comment:        The GeneXpert MRSA Assay (FDA approved for NASAL specimens only), is one component of a comprehensive MRSA colonization surveillance program. It is not intended to diagnose MRSA infection nor to guide or monitor treatment for MRSA infections.     Radiology Reports Ct Head Wo Contrast  12/30/2013   CLINICAL DATA:  79 year old diabetic hypertensive female with history of dementia presenting with increasing altered mental status. Initial encounter.  EXAM: CT HEAD WITHOUT CONTRAST  TECHNIQUE: Contiguous axial images were obtained from the base of the skull through the vertex without intravenous contrast.  COMPARISON:  05/07/2013 CT.  FINDINGS: New from the prior examination and of indeterminate is a right parietal-occipital lobe infarct. There may be laminar necrosis/petechial hemorrhage along the periphery of this infarct.  Small vessel disease type changes.  Atrophy without hydrocephalus.  No intracranial mass lesion noted on this unenhanced exam.  Vascular calcifications.  Banding left globe.   Mastoid air cells, middle ear cavities and visualized paranasal sinuses are clear. No calvarial abnormality.  IMPRESSION: New from the prior examination and of indeterminate is a right parietal-occipital lobe infarct. There may be laminar necrosis/petechial hemorrhage along the periphery of this infarct.  Small vessel disease type changes.  Atrophy without hydrocephalus.   Electronically Signed   By: Chauncey Cruel M.D.   On: 12/30/2013 07:17    CBC  Recent Labs Lab 12/28/13 2023 12/29/13 0410  WBC 7.8 7.4  HGB 14.2 14.5  HCT 50.4* 53.5*  PLT 126* 142*  MCV 109.3* 111.2*  MCH 30.8 30.1  MCHC 28.2* 27.1*  RDW 18.0* 17.9*  LYMPHSABS 1.7  --   MONOABS 0.6  --   EOSABS 0.1  --   BASOSABS 0.0  --     Chemistries   Recent Labs Lab 12/28/13 2307 12/29/13 0410 12/29/13 1503 12/29/13 2045 12/30/13 0520  NA 172* 174* 171* 166* 161*  K 3.6* 3.2* 3.6 3.7 4.1  CL 129* >130* >130* >130* >130*  CO2 _0 GLUCOSE 97 101* 91 153* 147*  BUN 65* 62* 52* 49* 41*  CREATININE 1.83* 1.74* 1.48* 1.49* 1.26*  CALCIUM 9.6 9.2 9.0 8.9 9.0  MG  --   --   --  2.9*  --   AST 19  --   --   --   --   ALT 6  --   --   --   --   ALKPHOS 102  --   --   --   --   BILITOT 0.5  --   --   --   --    ------------------------------------------------------------------------------------------------------------------ estimated creatinine clearance is 29.2 mL/min (by C-G formula based on Cr of 1.26). ------------------------------------------------------------------------------------------------------------------ No results for input(s): HGBA1C in the last 72 hours. ------------------------------------------------------------------------------------------------------------------ No results for input(s): CHOL, HDL, LDLCALC, TRIG, CHOLHDL, LDLDIRECT in the last 72 hours. ------------------------------------------------------------------------------------------------------------------  Recent Labs   12/29/13 2113  TSH 0.537   ------------------------------------------------------------------------------------------------------------------ No results for input(s): VITAMINB12, FOLATE, FERRITIN, TIBC, IRON, RETICCTPCT in the last 72 hours.  Coagulation profile No results for input(s): INR, PROTIME in the last 168 hours.  No results for input(s): DDIMER in the last 72 hours.  Cardiac Enzymes No results for input(s): CKMB, TROPONINI, MYOGLOBIN in the last 168 hours.  Invalid input(s): CK ------------------------------------------------------------------------------------------------------------------ Invalid input(s): POCBNP    Barton Dubois MD 12/30/2013 at 4:29 PM  Between 7am to 7pm - Pager - [phone]  After 7pm go to [email or web address] - password [email or web address]  And look for the night coverage person covering for me after hours  [facility] Office  [phone]

## 2013-12-30 NOTE — Consult Note (Signed)
Patient NW:GNFAOZ Altamura      DOB: 1934-08-26      HYQ:[id number]     Consult Note from the Palliative Medicine Team at [place] Requested by: Dr Dyann Kief    PCP: Hennie Duos, MD Reason for Consultation:Clarification of [place] and options       Phone Number:[phone]  Assessment of patients Current state:   Continued physical, functional and cognitive decline 2/2 to multiple co-morbidites;  ES-dementia, chronic dehydration/poor po intake, electrolyte imbalances, diabetes insipidus, overall failure to thrive (bedbound, total care).  Family is faced with advanced directive decisions and anticipatory care needs.  Consult is for review of medical treatment options, clarification of goals of care and end of life issues, disposition and options, and symptom recommendation.  This NP Wadie Lessen reviewed medical records, received report from team, assessed the patient and then meet at the patient's bedside along with her large family to include daughter, two sons, daughter in law and several grand-children  to discuss diagnosis prognosis, GOC, EOL wishes disposition and options.  A detailed discussion was had today regarding advanced directives.  Concepts specific to code status, artifical feeding and hydration, continued IV antibiotics and rehospitalization was had.  The difference between a aggressive medical intervention path  and a palliative comfort care path for this patient at this time was had.  Values and goals of care important to patient and family were attempted to be elicited.  Concept of Hospice and Palliative Care were discussed  Natural trajectory and expectations at EOL were discussed.  Questions and concerns addressed.  Hard Choices booklet left for review. Family encouraged to call with questions or concerns.  PMT will continue to support holistically.   Goals of Care: 1.  Code Status:  DNR/DNI-comfort is main focus of care   2. Scope of  Treatment:  1. Vital Signs:daily  2. Respiratory/Oxygen:as needed for comfort 3. Nutritional Support/Tube Feeds:no artificial feeding or hydration 4. Antibiotics: none 5. Blood Products:none 6. IVF:KVO for medications only 7. Review of Medications to be discontinued:  Minimize for comfort 8. Labs:none 9. Telemetry:none    3. Disposition:  Transition to med-surg floor and re-evaluate for hospice facility in the mroning   4. Symptom Management:   1. Anxiety/Agitation: Ativan 1 mg IV every 4 hr prn 2. Pain/Dyspnea: Morphine 1 mg IV every 1 hr prn  5. Psychosocial:  Emotional support offered to family at bedside.  All understand the limited prognosis.  6. Spiritual:  Family community church chaplain present with family   Brief HPI:    79 yo female with PHM significant for ES dementia, hypernatremia, AKI, diabetes insipidus, bipolar dementia/depression (long h/o Lithium use in past), overall failure to thrive.  Poor po intake, overall failure to thrive  ROS:    PMH:  Past Medical History  Diagnosis Date  . Thyroid cancer   . Ulcer     chronic  . Depression   . Vitamin D deficiency   . Anxiety   . Malaise and fatigue   . Osteoarthritis   . Kidney disease   . Pneumonia   . Dementia   . Alzheimer disease   . Hypothyroidism   . Bipolar 1 disorder   . Dehydration   . Hypertension   . Diabetes mellitus   . Pressure ulcer stage III   . Constipation   . Open-angle glaucoma   . Anemia   . Peripheral neuropathy   . Hypertensive heart disease   . CKD (chronic  kidney disease), stage III   . Osteoarthrosis   . Mixed hyperlipidemia      PSH: Past Surgical History  Procedure Laterality Date  . Esophagogastroduodenoscopy  02/23/2011    Procedure: ESOPHAGOGASTRODUODENOSCOPY (EGD);  Surgeon: Beryle Beams, MD;  Location: [facility] ENDOSCOPY;  Service: Endoscopy;  Laterality: N/A;  . Abdominal hysterectomy    . Foot surgery    . Retinal detachment surgery    . Thyroidectomy      I have reviewed the [place] and SH and  If appropriate update it with new information. Allergies  Allergen Reactions  . Lithium     NOTED ON MAR  . Penicillins     NOTED ON MAR  . Sulfa Antibiotics     NOTED ON MAR   Scheduled Meds: . atorvastatin  10 mg Oral QHS  . chlorhexidine  15 mL Mouth Rinse BID  . desmopressin  0.4 mg Oral BID  . diclofenac sodium  4 g Topical QID  . feeding supplement (ENSURE COMPLETE)  237 mL Oral BID BM  . feeding supplement (PRO-STAT SUGAR FREE 64)  30 mL Oral TID WC  . heparin  5,000 Units Subcutaneous 3 times per day  . latanoprost  1 drop Both Eyes QHS  . levothyroxine  37.5 mcg Intravenous Daily  . Paliperidone  1 tablet Oral QHS  . pantoprazole (PROTONIX) IV  40 mg Intravenous Daily  . PARoxetine  20 mg Oral Daily  . potassium chloride  40 mEq Oral Once  . sodium chloride  3 mL Intravenous Q12H   Continuous Infusions: . dextrose 5 % with KCl 20 mEq / L 20 mEq (12/30/13 1405)   PRN Meds:.bisacodyl, hydrOXYzine, ondansetron **OR** ondansetron (ZOFRAN) IV, traZODone    BP 96/61 mmHg  Pulse 53  Temp(Src) 97.8 F (36.6 C) (Axillary)  Resp 17  Ht 5\' 3"  (1.6 m)  Wt 49.5 kg (109 lb 2 oz)  BMI 19.34 kg/m2  SpO2 100%   PPS:20 %    Intake/Output Summary (Last 24 hours) at 12/30/13 1511 Last data filed at 12/30/13 1405  Gross per 24 hour  Intake 2231.33 ml  Output      4 ml  Net 2227.33 ml    Physical Exam:  General: chronically ill appearing, NAD HEENT:  Dry buccal membranes  Chest:   Decreased in bases CTA CVS: RRR Abdomen: soft +BS Ext: upper extremities contracted Neuro:  Open eyes, unable to follow commands, non verbal  Labs: CBC    Component Value Date/Time   WBC 7.4 12/29/2013 0410   RBC 4.81 12/29/2013 0410   RBC 4.43 09/06/2011 1955   HGB 14.5 12/29/2013 0410   HCT 53.5* 12/29/2013 0410   PLT 142* 12/29/2013 0410   MCV 111.2* 12/29/2013 0410   MCH 30.1 12/29/2013 0410   MCHC 27.1* 12/29/2013 0410   RDW 17.9*  12/29/2013 0410   LYMPHSABS 1.7 12/28/2013 2023   MONOABS 0.6 12/28/2013 2023   EOSABS 0.1 12/28/2013 2023   BASOSABS 0.0 12/28/2013 2023    BMET    Component Value Date/Time   NA 161* 12/30/2013 0520   K 4.1 12/30/2013 0520   CL >130* 12/30/2013 0520   CO2 27 12/30/2013 0520   GLUCOSE 147* 12/30/2013 0520   BUN 41* 12/30/2013 0520   CREATININE 1.26* 12/30/2013 0520   CALCIUM 9.0 12/30/2013 0520   CALCIUM 9.7 05/11/2008 0533   GFRNONAA 40* 12/30/2013 0520   GFRAA 46* 12/30/2013 0520    CMP     Component  Value Date/Time   NA 161* 12/30/2013 0520   K 4.1 12/30/2013 0520   CL >130* 12/30/2013 0520   CO2 27 12/30/2013 0520   GLUCOSE 147* 12/30/2013 0520   BUN 41* 12/30/2013 0520   CREATININE 1.26* 12/30/2013 0520   CALCIUM 9.0 12/30/2013 0520   CALCIUM 9.7 05/11/2008 0533   PROT 7.1 12/28/2013 2307   ALBUMIN 3.3* 12/28/2013 2307   AST 19 12/28/2013 2307   ALT 6 12/28/2013 2307   ALKPHOS 102 12/28/2013 2307   BILITOT 0.5 12/28/2013 2307   GFRNONAA 40* 12/30/2013 0520   GFRAA 46* 12/30/2013 0520     Time In Time Out Total Time Spent with Patient Total Overall Time  1515 1630 70 min 75 min    Greater than 50%  of this time was spent counseling and coordinating care related to the above assessment and plan.   Wadie Lessen NP  Palliative Medicine Team Team Phone # [phone] Pager [phone]  Discussed with Dr Dyann Kief

## 2013-12-30 NOTE — Progress Notes (Signed)
Report given to nealta, rn. Pt tranferrred to 3 rd floor.

## 2013-12-30 NOTE — Progress Notes (Signed)
Speech Language Pathology Treatment: Dysphagia  Patient Details Name: [name] MRN: [id number] DOB: 01/15/1934 Today's Date: 12/30/2013 Time: 2993-7169 SLP Time Calculation (min) (ACUTE ONLY): 11 min  Assessment / Plan / Recommendation Clinical Impression  Returned to provide assist to RN in facilitating pt awareness, providing hand over hand cueing for administration of meds in ice cream and 80% of ensure with prostat. Pt again able to successfully take PO with repositioning and gentle cueing. Also provided further education to pt family. SLP to f/u as needed.    HPI HPI: 79 yo female adm from SNF with AMS, diagnosed with hypernatremia.  Pt PMH + for dementia, thyroid cancer, GERD, bipolar d/o, dysphagia.  Pt has undergone endoscopy previously with findings of Zenker's diverticulum and hiatal hernia 02/23/2011.  Per review of chart, pt has been eating less recently.   She is on a puree/thin diet prior to admit per RN (RN states daughter informed her of premorbid diet).  Swallow eval ordered.    Pertinent Vitals Pain Assessment: Faces Faces Pain Scale: Hurts little more Pain Location:  (unable to state only "I dont feel good")  SLP Plan  Continue with current plan of care    Recommendations Diet recommendations: Dysphagia 1 (puree);Thin liquid Liquids provided via: Cup;Straw Medication Administration: Crushed with puree Supervision: Full supervision/cueing for compensatory strategies Compensations: Slow rate;Small sips/bites Postural Changes and/or Swallow Maneuvers: Seated upright 90 degrees              Oral Care Recommendations: Oral care BID Follow up Recommendations: Skilled Nursing facility Plan: Continue with current plan of care    GO    [facility] MA CCC-SLP [phone]  Lynann Beaver 12/30/2013, 12:15 PM

## 2013-12-31 MED ORDER — DICLOFENAC SODIUM 1 % TD GEL
4.0000 g | Freq: Four times a day (QID) | TRANSDERMAL | Status: DC
  Administered 2013-12-31 – 2014-01-04 (×15): 4 g via TOPICAL
  Filled 2013-12-31: qty 100

## 2013-12-31 MED ORDER — ENSURE COMPLETE PO LIQD
237.0000 mL | Freq: Two times a day (BID) | ORAL | Status: DC
  Administered 2013-12-31 – 2014-01-04 (×4): 237 mL via ORAL

## 2013-12-31 NOTE — Progress Notes (Addendum)
CSW met with pt, pt daughter [name], pt granddaughter, and other duaghter. Pt daughter [name] is Tax adviser for patient. Per disucssio nwith family, everyone is in agreement for residential hospice placement. Per discussion pt family interested in [facility] or [facility].Pt attending joined conversation who stated patient is stable for residential hospice once bed available.   CSW contaced [facility] who stated no bed available at this time. CSW to follow up on Saturday.  CSW contacted [facility]. CSW sent referral information to fax [phone].   Noreene Larsson [phone]  ED CSW 12/31/2013 1257pm    CSW spoke with Mechele Claude at [facility], pt may be appropriate for GIP at [facility]. [facility] left message for pt daughter [name]. [facility] to follow up with floor rn regarding further information needed for transition if patient is accepted. [phone] [facility] home.    CSW spoke with [name], and discussed [facility]. Pt daughter to call [facility] back to discuss further.   Noreene Larsson [phone]  ED CSW 12/31/2013 1343pm

## 2013-12-31 NOTE — Progress Notes (Addendum)
[facility]                                                                              Patient Demographics  [name], is a 79 y.o. female, DOB - [birth year], PPI:[id number]  Admit date - 12/28/2013   Admitting Physician Waldemar Dickens, MD  Outpatient Primary MD for the patient is Hennie Duos, MD  LOS - 3   Chief Complaint  Patient presents with  . Abnormal Lab    Ma 167      HPI on 12/29/2013 by Dr. Linna Darner [name] is a 79 y.o. Female.  Per report from EMS and NH staff pt was noted to have hypnatremia to the 157s. This is a chronic ongoing issue for the pt. Pt is weak and non-verbal but seems to be getting weaker and not eating or drinking more lately. Sodium level up to 10's range and patient not eating, drinking or able to follow commands. At this moment decision is for full comfort care given overall picture and gray prognosis. Family has met with PC service and will like to pursuit residential hospice if she is a candidate for it.    Assessment & Plan   Hypernatremia and hyperchloremia -Secondary to dehydration, from poor oral intake -Has had chronic hypernatremia in past (last hospital admission in 10/2013) -has failed demonstrating capacity of hydration or appropriate intake/nutrition by herself  -family not interested in PEG tube or artificial nutrition -after [place] meeting; plan is for comfort care.  -will stop IVF's, no more labs -comfort feeding and PRN morphine/ativan  Acute kidney injury -Baseline Cr 1.1, upon admission 1.83 -Secondary dehydration -last Cr down to 1.26 -no more labs draws; comfort care  Diabetes insipidus -no more blood monitoring -will stop all non-essential meds designated to achieve/maintain comfort care  Hypothyroid  -unable to swallow properly -and given comfort measure, will stop synthroid   Bipolar disorder/depression -unable to properly take any meds by mouth -will discontinue invega, trazodone and  paxil -will use ativan PRN instead  Insomnia -will treat with ativan  GERD -no nausea/vomtiing. Plan is for comfort care. -will stop IV protonix at this moment  Hyperlipidemia -plan is for comfort care -will discontinue statins  Hypokalemia -repleted with IVF's -now no more IVF's and no more labs -full comfort care  Dementia/Social  -possible progression of her dementia (per daughter patient's baseline has been declining over the past several months prior to admission and with really poor quality of life) -Discussed palliative care with the daughter and code status.   -patient is now DNR/DNI and in agreement to pursuit full comfort care  Code Status: Full  Family Communication: Daughter at bedside  Disposition Plan: transfer out of stepdown; med-surg bed and depending next 24 hours move to residential hospice if appropriate  Time Spent in minutes   25 minutes  Procedures  None  Consults   Palliative care  DVT Prophylaxis  SCD's  Lab Results  Component Value Date   PLT 142* 12/29/2013    Medications  Scheduled Meds: . chlorhexidine  15 mL Mouth Rinse BID  . diclofenac sodium  4 g Topical QID  . feeding supplement (ENSURE COMPLETE)  237 mL Oral BID BM  . latanoprost  1 drop Both Eyes QHS  . potassium chloride  40 mEq Oral Once  . sodium chloride  3 mL Intravenous Q12H   Continuous Infusions:   PRN Meds:.bisacodyl, LORazepam, morphine injection, ondansetron **OR** ondansetron (ZOFRAN) IV  Antibiotics    Anti-infectives    None       Subjective:   [name] seen and examined today.  Patient remains unable to follow commands or eat/drink properly. No fever or major distress/discomfort appreciated.  Objective:   Filed Vitals:   12/30/13 1300 12/30/13 1500 12/30/13 2008 12/31/13 0519  BP: 96/61 107/65 103/68 121/72  Pulse: 53 58 63 65  Temp:   98.5 F (36.9 C) 99 F (37.2 C)  TempSrc:   Axillary Axillary  Resp: 17 14 16 16   Height:   5'  3" (1.6 m)   Weight:   50.758 kg (111 lb 14.4 oz)   SpO2: 100% 100% 92% 100%    Wt Readings from Last 3 Encounters:  12/30/13 50.758 kg (111 lb 14.4 oz)  11/01/13 55.43 kg (122 lb 3.2 oz)  08/26/13 61.236 kg (135 lb)     Intake/Output Summary (Last 24 hours) at 12/31/13 1305 Last data filed at 12/31/13 0900  Gross per 24 hour  Intake 808.33 ml  Output      0 ml  Net 808.33 ml    Exam  General: dry MM, no major distress appreciated. Not eye contact or been able to follow commands  HEENT: NCAT, mucous membranes dry  Cardiovascular: S1 S2 auscultated, RRR  Respiratory: Clear to auscultation bilaterally with equal chest rise  Abdomen: Soft, nontender, nondistended, + bowel sounds  Extremities: warm dry without cyanosis, clubbing or edema  Neuro: unable to follow commands; not moving her limbs or retracting with painful stimuli   Data Review   Micro Results Recent Results (from the past 240 hour(s))  MRSA PCR Screening     Status: None   Collection Time: 12/29/13 10:59 AM  Result Value Ref Range Status   MRSA by PCR NEGATIVE NEGATIVE Final    Comment:        The GeneXpert MRSA Assay (FDA approved for NASAL specimens only), is one component of a comprehensive MRSA colonization surveillance program. It is not intended to diagnose MRSA infection nor to guide or monitor treatment for MRSA infections.     Radiology Reports Ct Head Wo Contrast  12/30/2013   CLINICAL DATA:  79 year old diabetic hypertensive female with history of dementia presenting with increasing altered mental status. Initial encounter.  EXAM: CT HEAD WITHOUT CONTRAST  TECHNIQUE: Contiguous axial images were obtained from the base of the skull through the vertex without intravenous contrast.  COMPARISON:  05/07/2013 CT.  FINDINGS: New from the prior examination and of indeterminate is a right parietal-occipital lobe infarct. There may be laminar necrosis/petechial hemorrhage along the periphery of  this infarct.  Small vessel disease type changes.  Atrophy without hydrocephalus.  No intracranial mass lesion noted on this unenhanced exam.  Vascular calcifications.  Banding left globe.  Mastoid air cells, middle ear cavities and visualized paranasal sinuses are clear. No calvarial abnormality.  IMPRESSION: New from the prior examination and of indeterminate is a right parietal-occipital lobe infarct. There may be laminar necrosis/petechial hemorrhage along the periphery of this infarct.  Small vessel disease type changes.  Atrophy without hydrocephalus.   Electronically Signed   By: Chauncey Cruel M.D.   On: 12/30/2013 07:17  CBC  Recent Labs Lab 12/28/13 2023 12/29/13 0410  WBC 7.8 7.4  HGB 14.2 14.5  HCT 50.4* 53.5*  PLT 126* 142*  MCV 109.3* 111.2*  MCH 30.8 30.1  MCHC 28.2* 27.1*  RDW 18.0* 17.9*  LYMPHSABS 1.7  --   MONOABS 0.6  --   EOSABS 0.1  --   BASOSABS 0.0  --     Chemistries   Recent Labs Lab 12/28/13 2307 12/29/13 0410 12/29/13 1503 12/29/13 2045 12/30/13 0520  NA 172* 174* 171* 166* 161*  K 3.6* 3.2* 3.6 3.7 4.1  CL 129* >130* >130* >130* >130*  CO2 26 28 27 27 27   GLUCOSE 97 101* 91 153* 147*  BUN 65* 62* 52* 49* 41*  CREATININE 1.83* 1.74* 1.48* 1.49* 1.26*  CALCIUM 9.6 9.2 9.0 8.9 9.0  MG  --   --   --  2.9*  --   AST 19  --   --   --   --   ALT 6  --   --   --   --   ALKPHOS 102  --   --   --   --   BILITOT 0.5  --   --   --   --    ------------------------------------------------------------------------------------------------------------------ estimated creatinine clearance is 30 mL/min (by C-G formula based on Cr of 1.26). ------------------------------------------------------------------------------------------------------------------ No results for input(s): HGBA1C in the last 72 hours. ------------------------------------------------------------------------------------------------------------------ No results for input(s): CHOL, HDL,  LDLCALC, TRIG, CHOLHDL, LDLDIRECT in the last 72 hours. ------------------------------------------------------------------------------------------------------------------  Recent Labs  12/29/13 2113  TSH 0.537   ------------------------------------------------------------------------------------------------------------------    Barton Dubois MD 12/31/2013 at 1:05 PM  Between 7am to 7pm - Pager - [phone]  After 7pm go to [email or web address] - password [email or web address]  And look for the night coverage person covering for me after hours  [facility] Office  [phone]

## 2013-12-31 NOTE — Progress Notes (Signed)
[facility] -- referral received. Referral received via fax to our office and TC from [place].  Info obtained and we tried to contact the daughter by home phone and also by cell.  Message left on the cell phone to call us when she had the opportunity. Left our number.  Also spok to the [place] who checked the room for [place] to make sure the family was not present at hospital. We are waiting on a TC from the family to set up a meeting time and evaluate the pt for our inpatient hospice home. Thank you for this referral and the opportunity to connect with our community.  [place]

## 2013-12-31 NOTE — Progress Notes (Signed)
SLP Cancellation Note  Patient Details Name: [name] MRN: [id number] DOB: 1934-05-17   Cancelled treatment:       Reason Eval/Treat Not Completed: Other (comment) (note plans now for comfort only, slp to sign off, please reorder if indicated/desired)   Luanna Salk, [place] [facility] SLP [phone]

## 2013-12-31 NOTE — Progress Notes (Signed)
CSW received consult for residential hospice placement. Per chart review,patient is a resident at [facility] and rehab for long term care. Pt, pt family, and chaplain met with Palliative care team and recommendations and choices expressed for residential hospice placement. Per chart review, patient family interested in [facility] residential hospice. CSW called and left message with patient daughter, Benjaman Lobe at [phone] to discuss disposition.   Noreene Larsson [phone]  ED CSW 12/31/2013 1059am

## 2013-12-31 NOTE — Progress Notes (Signed)
Progress Note from the Palliative Medicine Team at [facility]  Subjective:  -spoke with daughter [name], family is at peace with decision for comfort, hopeful for hospice facility (hopeful for [facility])  -pateint continues with sips only for intake, opens eyes but unable to follow commands, incontinent  -discussed with nursing need to utilize ativan prn to address behavior symptom of agitation    Objective: Allergies  Allergen Reactions  . Lithium     NOTED ON MAR  . Penicillins     NOTED ON MAR  . Sulfa Antibiotics     NOTED ON MAR   Scheduled Meds: . chlorhexidine  15 mL Mouth Rinse BID  . diclofenac sodium  4 g Topical QID  . latanoprost  1 drop Both Eyes QHS  . pantoprazole (PROTONIX) IV  40 mg Intravenous Q24H  . potassium chloride  40 mEq Oral Once  . sodium chloride  3 mL Intravenous Q12H   Continuous Infusions:  PRN Meds:.bisacodyl, LORazepam, morphine injection, ondansetron **OR** ondansetron (ZOFRAN) IV  BP 121/72 mmHg  Pulse 65  Temp(Src) 99 F (37.2 C) (Axillary)  Resp 16  Ht 5\' 3"  (1.6 m)  Wt 50.758 kg (111 lb 14.4 oz)  BMI 19.83 kg/m2  SpO2 100%   PPS:20 %     Intake/Output Summary (Last 24 hours) at 12/31/13 1051 Last data filed at 12/31/13 0900  Gross per 24 hour  Intake 841.33 ml  Output      0 ml  Net 841.33 ml        Physical Exam:  General: chronically ill appearing, NAD HEENT:  Dry buccal membranes  Chest:   CTA CVS: RRR  Labs: CBC    Component Value Date/Time   WBC 7.4 12/29/2013 0410   RBC 4.81 12/29/2013 0410   RBC 4.43 09/06/2011 1955   HGB 14.5 12/29/2013 0410   HCT 53.5* 12/29/2013 0410   PLT 142* 12/29/2013 0410   MCV 111.2* 12/29/2013 0410   MCH 30.1 12/29/2013 0410   MCHC 27.1* 12/29/2013 0410   RDW 17.9* 12/29/2013 0410   LYMPHSABS 1.7 12/28/2013 2023   MONOABS 0.6 12/28/2013 2023   EOSABS 0.1 12/28/2013 2023   BASOSABS 0.0 12/28/2013 2023    BMET    Component Value Date/Time   NA 161* 12/30/2013 0520    K 4.1 12/30/2013 0520   CL >130* 12/30/2013 0520   CO2 27 12/30/2013 0520   GLUCOSE 147* 12/30/2013 0520   BUN 41* 12/30/2013 0520   CREATININE 1.26* 12/30/2013 0520   CALCIUM 9.0 12/30/2013 0520   CALCIUM 9.7 05/11/2008 0533   GFRNONAA 40* 12/30/2013 0520   GFRAA 46* 12/30/2013 0520    CMP     Component Value Date/Time   NA 161* 12/30/2013 0520   K 4.1 12/30/2013 0520   CL >130* 12/30/2013 0520   CO2 27 12/30/2013 0520   GLUCOSE 147* 12/30/2013 0520   BUN 41* 12/30/2013 0520   CREATININE 1.26* 12/30/2013 0520   CALCIUM 9.0 12/30/2013 0520   CALCIUM 9.7 05/11/2008 0533   PROT 7.1 12/28/2013 2307   ALBUMIN 3.3* 12/28/2013 2307   AST 19 12/28/2013 2307   ALT 6 12/28/2013 2307   ALKPHOS 102 12/28/2013 2307   BILITOT 0.5 12/28/2013 2307   GFRNONAA 40* 12/30/2013 0520   GFRAA 46* 12/30/2013 0520     Assessment and Plan: 1. Code Status: DNR/DNI-comfort is main focus of care 2. Symptom Control: Agitation: Ativan 1 mg every 3 hr prn Pain/Dyspnea: Morphine 1 mg IV  every 1 hr prn Foley cath for comfort 3. Disposition:  Hopeful for  Hospice facility   Time In Time Out Total Time Spent with Patient Total Overall Time  0730  0750 20 min 20 min    Greater than 50%  of this time was spent counseling and coordinating care related to the above assessment and plan.  Wadie Lessen NP  Palliative Medicine Team Team Phone # [phone] Pager [phone]   1

## 2014-01-01 LAB — BASIC METABOLIC PANEL
Anion gap: 11 (ref 5–15)
BUN: 25 mg/dL — AB (ref 6–23)
CALCIUM: 9.2 mg/dL (ref 8.4–10.5)
CO2: 22 mmol/L (ref 19–32)
Chloride: 119 mEq/L — ABNORMAL HIGH (ref 96–112)
Creatinine, Ser: 1.12 mg/dL — ABNORMAL HIGH (ref 0.50–1.10)
GFR, EST AFRICAN AMERICAN: 53 mL/min — AB (ref >90)
GFR, EST NON AFRICAN AMERICAN: 46 mL/min — AB (ref >90)
GLUCOSE: 208 mg/dL — AB (ref 70–99)
POTASSIUM: 4.4 mmol/L (ref 3.5–5.1)
Sodium: 152 mmol/L — ABNORMAL HIGH (ref 135–145)

## 2014-01-01 MED ORDER — POTASSIUM CL IN DEXTROSE 5% 20 MEQ/L IV SOLN
20.0000 meq | INTRAVENOUS | Status: DC
  Administered 2014-01-01 – 2014-01-02 (×2): 20 meq via INTRAVENOUS
  Filled 2014-01-01 (×2): qty 1000

## 2014-01-01 NOTE — Progress Notes (Signed)
[facility]                                                                              Patient Demographics  [name], is a 79 y.o. female, DOB - 1934/10/23, HQR:[id number]  Admit date - 12/28/2013   Admitting Physician Waldemar Dickens, MD  Outpatient Primary MD for the patient is Hennie Duos, MD  LOS - 4   Chief Complaint  Patient presents with  . Abnormal Lab    Ma 167      HPI on 12/29/2013 by Dr. Linna Darner [name] is a 79 y.o. Female.  Per report from EMS and NH staff pt was noted to have hypnatremia to the 157s. This is a chronic ongoing issue for the pt. Pt is weak and non-verbal but seems to be getting weaker and not eating or drinking more lately. Sodium level up to 10's range and patient not eating, drinking or able to follow commands. At this moment decision is for full comfort care given overall picture and gray prognosis. Family has met with PC service and will like to pursuit residential hospice if she is a candidate for it.    Assessment & Plan   Hypernatremia and hyperchloremia -Secondary to dehydration, from poor oral intake -Has had chronic hypernatremia in past (last hospital admission in 10/2013) -has failed demonstrating capacity of hydration or appropriate intake/nutrition by herself  -family not interested in PEG tube or artificial nutrition -after [place] meeting; plan is for comfort care.  -will stop IVF's, no more labs -comfort feeding and PRN morphine/ativan  Acute kidney injury -Baseline Cr 1.1, upon admission 1.83 -Secondary dehydration -last Cr down to 1.26 -no more labs draws; comfort care  Diabetes insipidus -no more blood monitoring -will stop all non-essential meds designated to achieve/maintain comfort care  Hypothyroid  -unable to swallow properly -and given comfort measure, will stop synthroid   Bipolar disorder/depression -unable to properly take any meds by mouth -will discontinue invega, trazodone and  paxil -will use ativan PRN instead  Insomnia -will treat with ativan  GERD -no nausea/vomtiing. Plan is for comfort care. -will stop IV protonix at this moment  Hyperlipidemia -plan is for comfort care -will discontinue statins  Hypokalemia -repleted with IVF's -now no more IVF's and no more labs -full comfort care  Dementia/Social  -possible progression of her dementia (per daughter patient's baseline has been declining over the past several months prior to admission and with really poor quality of life) -Discussed palliative care with the daughter and code status.   -patient is now DNR/DNI and in agreement to pursuit full comfort care  Code Status: Full  Family Communication: Daughter aby phone ([name]) and Patient's Son at bedside  Disposition Plan: waiting on hospice facility for discharge  Time Spent in minutes   25 minutes  Procedures  None  Consults   Palliative care  DVT Prophylaxis  SCD's  Lab Results  Component Value Date   PLT 142* 12/29/2013    Medications  Scheduled Meds: . chlorhexidine  15 mL Mouth Rinse BID  . diclofenac sodium  4 g Topical QID  . feeding supplement (ENSURE COMPLETE)  237 mL Oral BID BM  .  latanoprost  1 drop Both Eyes QHS  . potassium chloride  40 mEq Oral Once  . sodium chloride  3 mL Intravenous Q12H   Continuous Infusions:   PRN Meds:.bisacodyl, LORazepam, morphine injection, ondansetron **OR** ondansetron (ZOFRAN) IV  Antibiotics    Anti-infectives    None       Subjective:   Daleen Squibb seen and examined today.  Patient remains unable to follow commands or eat/drink properly (intermittent comfort feed intake when family feed her). No fever or major distress/discomfort appreciated.  Objective:   Filed Vitals:   12/31/13 0519 12/31/13 1430 12/31/13 2049 01/01/14 0528  BP: 121/72 120/70 108/69 112/61  Pulse: 65 60 87 54  Temp: 99 F (37.2 C) 98.9 F (37.2 C) 99 F (37.2 C) 98.1 F (36.7 C)  TempSrc:  Axillary Axillary Axillary Oral  Resp: 16 16 16 16   Height:      Weight:      SpO2: 100% 100% 96% 99%    Wt Readings from Last 3 Encounters:  12/30/13 50.758 kg (111 lb 14.4 oz)  11/01/13 55.43 kg (122 lb 3.2 oz)  08/26/13 61.236 kg (135 lb)     Intake/Output Summary (Last 24 hours) at 01/01/14 1114 Last data filed at 01/01/14 0932  Gross per 24 hour  Intake    940 ml  Output   1450 ml  Net   -510 ml    Exam  General: intermittently taking some PO's when family feed her. Has remained non-verbal and unable to follow commands. No acute distress  HEENT: NCAT, mucous membranes dry  Cardiovascular: S1 S2 auscultated, RRR  Respiratory: Clear to auscultation bilaterally with equal chest rise  Abdomen: Soft, nontender, nondistended, + bowel sounds  Extremities: warm dry without cyanosis, clubbing or edema  Neuro: unable to follow commands; not moving her limbs or retracting with painful stimuli   Data Review   Micro Results Recent Results (from the past 240 hour(s))  MRSA PCR Screening     Status: None   Collection Time: 12/29/13 10:59 AM  Result Value Ref Range Status   MRSA by PCR NEGATIVE NEGATIVE Final    Comment:        The GeneXpert MRSA Assay (FDA approved for NASAL specimens only), is one component of a comprehensive MRSA colonization surveillance program. It is not intended to diagnose MRSA infection nor to guide or monitor treatment for MRSA infections.     Radiology Reports Ct Head Wo Contrast  12/30/2013   CLINICAL DATA:  79 year old diabetic hypertensive female with history of dementia presenting with increasing altered mental status. Initial encounter.  EXAM: CT HEAD WITHOUT CONTRAST  TECHNIQUE: Contiguous axial images were obtained from the base of the skull through the vertex without intravenous contrast.  COMPARISON:  05/07/2013 CT.  FINDINGS: New from the prior examination and of indeterminate is a right parietal-occipital lobe infarct. There  may be laminar necrosis/petechial hemorrhage along the periphery of this infarct.  Small vessel disease type changes.  Atrophy without hydrocephalus.  No intracranial mass lesion noted on this unenhanced exam.  Vascular calcifications.  Banding left globe.  Mastoid air cells, middle ear cavities and visualized paranasal sinuses are clear. No calvarial abnormality.  IMPRESSION: New from the prior examination and of indeterminate is a right parietal-occipital lobe infarct. There may be laminar necrosis/petechial hemorrhage along the periphery of this infarct.  Small vessel disease type changes.  Atrophy without hydrocephalus.   Electronically Signed   By: Chauncey Cruel M.D.   On:  12/30/2013 07:17    CBC  Recent Labs Lab 12/28/13 2023 12/29/13 0410  WBC 7.8 7.4  HGB 14.2 14.5  HCT 50.4* 53.5*  PLT 126* 142*  MCV 109.3* 111.2*  MCH 30.8 30.1  MCHC 28.2* 27.1*  RDW 18.0* 17.9*  LYMPHSABS 1.7  --   MONOABS 0.6  --   EOSABS 0.1  --   BASOSABS 0.0  --     Chemistries   Recent Labs Lab 12/28/13 2307 12/29/13 0410 12/29/13 1503 12/29/13 2045 12/30/13 0520  NA 172* 174* 171* 166* 161*  K 3.6* 3.2* 3.6 3.7 4.1  CL 129* >130* >130* >130* >130*  CO2 26 28 27 27 27   GLUCOSE 97 101* 91 153* 147*  BUN 65* 62* 52* 49* 41*  CREATININE 1.83* 1.74* 1.48* 1.49* 1.26*  CALCIUM 9.6 9.2 9.0 8.9 9.0  MG  --   --   --  2.9*  --   AST 19  --   --   --   --   ALT 6  --   --   --   --   ALKPHOS 102  --   --   --   --   BILITOT 0.5  --   --   --   --    ------------------------------------------------------------------------------------------------------------------ estimated creatinine clearance is 30 mL/min (by C-G formula based on Cr of 1.26). ------------------------------------------------------------------------------------------------------------------ No results for input(s): HGBA1C in the last 72  hours. ------------------------------------------------------------------------------------------------------------------ No results for input(s): CHOL, HDL, LDLCALC, TRIG, CHOLHDL, LDLDIRECT in the last 72 hours. ------------------------------------------------------------------------------------------------------------------  Recent Labs  12/29/13 2113  TSH 0.537   ------------------------------------------------------------------------------------------------------------------    Barton Dubois MD 01/01/2014 at 11:14 AM  Between 7am to 7pm - Pager - [phone]  After 7pm go to [email or web address] - password [email or web address]  And look for the night coverage person covering for me after hours  [facility] Office  [phone]

## 2014-01-02 DIAGNOSIS — K219 Gastro-esophageal reflux disease without esophagitis: Secondary | ICD-10-CM

## 2014-01-02 DIAGNOSIS — H409 Unspecified glaucoma: Secondary | ICD-10-CM | POA: Insufficient documentation

## 2014-01-02 LAB — COMPREHENSIVE METABOLIC PANEL
ALBUMIN: 2.9 g/dL — AB (ref 3.5–5.2)
ALK PHOS: 95 U/L (ref 39–117)
ALT: 10 U/L (ref 0–35)
AST: 43 U/L — ABNORMAL HIGH (ref 0–37)
Anion gap: 9 (ref 5–15)
BUN: 21 mg/dL (ref 6–23)
CO2: 17 mmol/L — ABNORMAL LOW (ref 19–32)
Calcium: 8.5 mg/dL (ref 8.4–10.5)
Chloride: 118 mEq/L — ABNORMAL HIGH (ref 96–112)
Creatinine, Ser: 1.24 mg/dL — ABNORMAL HIGH (ref 0.50–1.10)
GFR calc Af Amer: 47 mL/min — ABNORMAL LOW (ref >90)
GFR calc non Af Amer: 41 mL/min — ABNORMAL LOW (ref >90)
Glucose, Bld: 134 mg/dL — ABNORMAL HIGH (ref 70–99)
POTASSIUM: 5.3 mmol/L — AB (ref 3.5–5.1)
Sodium: 144 mmol/L (ref 135–145)
Total Bilirubin: 2.2 mg/dL — ABNORMAL HIGH (ref 0.3–1.2)
Total Protein: 5.9 g/dL — ABNORMAL LOW (ref 6.0–8.3)

## 2014-01-02 LAB — MAGNESIUM: MAGNESIUM: 2.4 mg/dL (ref 1.5–2.5)

## 2014-01-02 MED ORDER — DEXTROSE 5 % IV SOLN
INTRAVENOUS | Status: DC
  Administered 2014-01-02 – 2014-01-03 (×2): via INTRAVENOUS

## 2014-01-02 MED ORDER — LEVOTHYROXINE SODIUM 75 MCG PO TABS
75.0000 ug | ORAL_TABLET | Freq: Every day | ORAL | Status: DC
  Administered 2014-01-04: 75 ug via ORAL
  Filled 2014-01-02 (×3): qty 1

## 2014-01-02 MED ORDER — PANTOPRAZOLE SODIUM 40 MG PO TBEC
40.0000 mg | DELAYED_RELEASE_TABLET | Freq: Every day | ORAL | Status: DC
  Administered 2014-01-04: 40 mg via ORAL
  Filled 2014-01-02 (×2): qty 1

## 2014-01-02 MED ORDER — LATANOPROST 0.005 % OP SOLN
1.0000 [drp] | Freq: Every day | OPHTHALMIC | Status: DC
  Administered 2014-01-02 – 2014-01-03 (×2): 1 [drp] via OPHTHALMIC
  Filled 2014-01-02: qty 2.5

## 2014-01-02 NOTE — Clinical Social Work Note (Signed)
  CSW met with hospice from [facility] nurse who had just done an assessment on pt and stated that she was appropriate for hospice services (not inpatient) .  Hospice nurse also stated that she had just met with family of the pt who have stated that they want to continue giving fluids and anything else to help their mother feel better so hospice services would not be appropriate at this time.   If family changes their mind CSW can refer again.  CSW met with family who stated that they feel their mom is getting better with the fluids and they want to continue giving her fluids and see if she gets better.  The family wants pt to return to [facility] with the possiblility of  hospice services if she does not appear to get any better before leaving the hospital.   Family is aware that [facility] does not have a contract with [facility] and are ok with CSW referring to hospice and [place] called and spoke with [facility]  in [place] and they will forward referral on to social worker for a phone call to be ready for a Monday discharge if needed.  CSW will continue to monitor pt until discharge.  Dede Query, LCSW [place] Worker - Weekend Coverage cell #: [phone].

## 2014-01-02 NOTE — Progress Notes (Addendum)
 [facility]                                                                              Patient Demographics  [name], is a 79 y.o. female, DOB - 08/08/1934, MRN:[id number]  Admit date - 12/28/2013   Admitting Physician David J Merrell, MD  Outpatient Primary MD for the patient is ALEXANDER, ANNE D, MD  LOS - 5   Chief Complaint  Patient presents with  . Abnormal Lab    Ma 167      HPI on 12/29/2013 by Dr. David Merrell [name] is a 79 y.o. Female.  Per report from EMS and NH staff pt was noted to have hypnatremia to the 157s. This is a chronic ongoing issue for the pt. Pt is weak and non-verbal but seems to be getting weaker and not eating or drinking more lately. Sodium level up to 10's range and patient not eating, drinking or able to follow commands. At this moment decision is for full comfort care given overall picture and gray prognosis. Family has met with PC service and initially wanted full comfort and residential hospice. Patient make some improvement and family has changed their mind now, would like to provide hydration and for patient to be discharge to SNF with close follow up by them to ensure as good as possible PO intake    Assessment & Plan   Hypernatremia and hyperchloremia -Secondary to dehydration, from poor oral intake -Has had chronic hypernatremia in past (last hospital admission in 10/2013) -has failed demonstrating capacity of hydration or appropriate intake/nutrition by herself  -family is interested now in look after mother intake/hydration status at the facility and would like to give another chance. -plan is for SNF on Monday -No PEG tube or artificial nutrition -will request palliative care follow up at SNF  -continue PRN morphine and ativan.  Acute kidney injury -Baseline Cr 1.1, upon admission 1.83 -Secondary dehydration -last Cr down to 1.24 -D5 IVF's re-initiated as per family petition -plan is to improve hydration as much  as possible prior to discharge and family would be vigilant regarding patient PO intake at facility  Hypothyroid  -will continue synthroid  Bipolar disorder/depression -will use PRN ativan -trazodone, invega and paxil discontinued currently  Insomnia -PRN ativan  GERD -continue PPI  Hyperlipidemia -will discontinue statins -at this moment will have a conservative approach and limit to medications that can assist in maintaining comfortable state  Hypokalemia -repleted with IVF's -after slight improvement, family ask for more labs to be taken and to replete if needed -K 5.2  Dementia/Social  -possible progression of her dementia (per daughter patient's baseline has been declining over the past several months prior to admission and with really poor quality of life) -Discussed with daugther and patient will remained DNR/DNI -plan is for patient to go back to SNF and family will help providing care and attention  Glaucoma -continue Eye drops  Code Status: Full  Family Communication: Daughter aby phone ([name]) and Patient's Son at bedside  Disposition Plan: patient with some improvement and family would like patient to go back to SNF; they will now follow her closely and provide more care/attention to her.    Time Spent in minutes   25 minutes  Procedures  None  Consults   Palliative care  DVT Prophylaxis  SCD's  Lab Results  Component Value Date   PLT 142* 12/29/2013    Medications  Scheduled Meds: . chlorhexidine  15 mL Mouth Rinse BID  . diclofenac sodium  4 g Topical QID  . feeding supplement (ENSURE COMPLETE)  237 mL Oral BID BM  . latanoprost  1 drop Both Eyes QHS  . [START ON 01/03/2014] levothyroxine  75 mcg Oral QAC breakfast  . pantoprazole  40 mg Oral Q1200  . sodium chloride  3 mL Intravenous Q12H   Continuous Infusions: . dextrose 50 mL/hr at 01/02/14 1134   PRN Meds:.bisacodyl, LORazepam, morphine injection, ondansetron **OR** ondansetron  (ZOFRAN) IV  Antibiotics    Anti-infectives    None       Subjective:   Daleen Squibb seen and examined today.  Patient able to open eyes by commands and was able to express simple words/phrases. No fever. Family will like to pursuit SNF placement again .  Objective:   Filed Vitals:   01/01/14 1347 01/01/14 2100 01/02/14 0408 01/02/14 1335  BP: 125/69 118/74 100/80 108/73  Pulse: 82 81 64 66  Temp: 97.6 F (36.4 C) 97.5 F (36.4 C) 97.9 F (36.6 C) 97.1 F (36.2 C)  TempSrc: Oral Oral Oral Oral  Resp: _0 Height:      Weight:      SpO2: 97% 98% 99% 98%    Wt Readings from Last 3 Encounters:  12/30/13 50.758 kg (111 lb 14.4 oz)  11/01/13 55.43 kg (122 lb 3.2 oz)  08/26/13 61.236 kg (135 lb)     Intake/Output Summary (Last 24 hours) at 01/02/14 1511 Last data filed at 01/02/14 1230  Gross per 24 hour  Intake    720 ml  Output   1250 ml  Net   -530 ml    Exam  General: patient has remain stable. No distress; able to talk in simple words and phrases; eating and drinking better with family. No fever.  HEENT: NCAT, no drainage out of ears or nostrils; mild dryness MM  Cardiovascular: S1 S2 auscultated, RRR  Respiratory: Clear to auscultation bilaterally with equal chest rise  Abdomen: Soft, nontender, nondistended, + bowel sounds  Extremities: warm dry without cyanosis, clubbing or edema  Neuro: open eyes spontaneously and is able to say couple simple words/phrases. Exam limited given underlying dementia   Data Review   Micro Results Recent Results (from the past 240 hour(s))  MRSA PCR Screening     Status: None   Collection Time: 12/29/13 10:59 AM  Result Value Ref Range Status   MRSA by PCR NEGATIVE NEGATIVE Final    Comment:        The GeneXpert MRSA Assay (FDA approved for NASAL specimens only), is one component of a comprehensive MRSA colonization surveillance program. It is not intended to diagnose MRSA infection nor to guide  or monitor treatment for MRSA infections.     Radiology Reports Ct Head Wo Contrast  12/30/2013   CLINICAL DATA:  79 year old diabetic hypertensive female with history of dementia presenting with increasing altered mental status. Initial encounter.  EXAM: CT HEAD WITHOUT CONTRAST  TECHNIQUE: Contiguous axial images were obtained from the base of the skull through the vertex without intravenous contrast.  COMPARISON:  05/07/2013 CT.  FINDINGS: New from the prior examination and of indeterminate is a right parietal-occipital lobe infarct. There  may be laminar necrosis/petechial hemorrhage along the periphery of this infarct.  Small vessel disease type changes.  Atrophy without hydrocephalus.  No intracranial mass lesion noted on this unenhanced exam.  Vascular calcifications.  Banding left globe.  Mastoid air cells, middle ear cavities and visualized paranasal sinuses are clear. No calvarial abnormality.  IMPRESSION: New from the prior examination and of indeterminate is a right parietal-occipital lobe infarct. There may be laminar necrosis/petechial hemorrhage along the periphery of this infarct.  Small vessel disease type changes.  Atrophy without hydrocephalus.   Electronically Signed   By: Steve  Olson M.D.   On: 12/30/2013 07:17    CBC  Recent Labs Lab 12/28/13 2023 12/29/13 0410  WBC 7.8 7.4  HGB 14.2 14.5  HCT 50.4* 53.5*  PLT 126* 142*  MCV 109.3* 111.2*  MCH 30.8 30.1  MCHC 28.2* 27.1*  RDW 18.0* 17.9*  LYMPHSABS 1.7  --   MONOABS 0.6  --   EOSABS 0.1  --   BASOSABS 0.0  --     Chemistries   Recent Labs Lab 12/28/13 2307  12/29/13 1503 12/29/13 2045 12/30/13 0520 01/01/14 1723 01/02/14 0507  NA 172*  < > 171* 166* 161* 152* 144  K 3.6*  < > 3.6 3.7 4.1 4.4 5.3*  CL 129*  < > >130* >130* >130* 119* 118*  CO2 26  < > 27 27 27 22 17*  GLUCOSE 97  < > 91 153* 147* 208* 134*  BUN 65*  < > 52* 49* 41* 25* 21  CREATININE 1.83*  < > 1.48* 1.49* 1.26* 1.12* 1.24*   CALCIUM 9.6  < > 9.0 8.9 9.0 9.2 8.5  MG  --   --   --  2.9*  --   --  2.4  AST 19  --   --   --   --   --  43*  ALT 6  --   --   --   --   --  10  ALKPHOS 102  --   --   --   --   --  95  BILITOT 0.5  --   --   --   --   --  2.2*  < > = values in this interval not displayed. ------------------------------------------------------------------------------------------------------------------ estimated creatinine clearance is 30.5 mL/min (by C-G formula based on Cr of 1.24). ------------------------------------------------------------------------------------------------------------------ No results for input(s): HGBA1C in the last 72 hours. ------------------------------------------------------------------------------------------------------------------ No results for input(s): CHOL, HDL, LDLCALC, TRIG, CHOLHDL, LDLDIRECT in the last 72 hours. ------------------------------------------------------------------------------------------------------------------ No results for input(s): TSH, T4TOTAL, T3FREE, THYROIDAB in the last 72 hours.  Invalid input(s): FREET3 ------------------------------------------------------------------------------------------------------------------    ,  MD 01/02/2014 at 3:11 PM  Between 7am to 7pm - Pager - [phone]  After 7pm go to [email or web address] - password [email or web address]  And look for the night coverage person covering for me after hours  [facility] Office  [phone]  

## 2014-01-03 DIAGNOSIS — N183 Chronic kidney disease, stage 3 unspecified: Secondary | ICD-10-CM

## 2014-01-03 DIAGNOSIS — N179 Acute kidney failure, unspecified: Secondary | ICD-10-CM

## 2014-01-03 NOTE — Progress Notes (Signed)
PROGRESS NOTE  [name] WIO:[id number] DOB: 12-Jan-1934 DOA: 12/28/2013 PCP: Hennie Duos, MD  Assessment/Plan: Hypernatremia and hyperchloremia -Secondary to dehydration, from poor oral intake with component of chronic diabetes insipidus from long term lithium use -Has had chronic hypernatremia in past (last hospital admission in 10/2013) -has failed demonstrating capacity of hydration or appropriate intake/nutrition by herself  -family is interested now in looking after pt intake/hydration status at the facility and would like to give another chance. -plan is for SNF on Monday -No PEG tube or artificial nutrition -will request palliative care follow up at SNF  -continue PRN morphine and ativan.  Acute on chronic renal failure (CKD3) -Baseline Cr 1.0-1.3  -upon admission 1.83 -Secondary dehydration -last Cr down to 1.24 -D5 IVF's re-initiated as per family petition -plan is to improve hydration as much as possible prior to discharge and family would be vigilant regarding patient PO intake at facility  Acute encephalopathy -Likely secondary to dehydration and hypernatremia -Patient's daughter states that the patient is at baseline today (01/03/2014)  Hypothyroid  -will continue synthroid -TSH 0.537  Bipolar disorder/depression -will use PRN ativan -trazodone, invega and paxil discontinued currently  Insomnia -PRN ativan  GERD -continue PPI  Hyperlipidemia -will discontinue statins -at this moment will have a conservative approach and limit to medications that can assist in maintaining comfortable state  Hypokalemia -repleted with IVF's -after slight improvement, family ask for more labs to be taken and to replete if needed -K 5.2  Dementia/Social  -possible progression of her dementia (per daughter patient's baseline has been declining over the past several months prior to admission and with really poor quality of life) -Discussed with  daugther and patient will remained DNR/DNI -plan is for patient to go back to SNF and family will help providing care and attention  Glaucoma -continue Eye drops  Code Status: Full  Family Communication: Daughter at bedside ([name])         Procedures/Studies: Ct Head Wo Contrast  12/30/2013   CLINICAL DATA:  79 year old diabetic hypertensive female with history of dementia presenting with increasing altered mental status. Initial encounter.  EXAM: CT HEAD WITHOUT CONTRAST  TECHNIQUE: Contiguous axial images were obtained from the base of the skull through the vertex without intravenous contrast.  COMPARISON:  05/07/2013 CT.  FINDINGS: New from the prior examination and of indeterminate is a right parietal-occipital lobe infarct. There may be laminar necrosis/petechial hemorrhage along the periphery of this infarct.  Small vessel disease type changes.  Atrophy without hydrocephalus.  No intracranial mass lesion noted on this unenhanced exam.  Vascular calcifications.  Banding left globe.  Mastoid air cells, middle ear cavities and visualized paranasal sinuses are clear. No calvarial abnormality.  IMPRESSION: New from the prior examination and of indeterminate is a right parietal-occipital lobe infarct. There may be laminar necrosis/petechial hemorrhage along the periphery of this infarct.  Small vessel disease type changes.  Atrophy without hydrocephalus.   Electronically Signed   By: Chauncey Cruel M.D.   On: 12/30/2013 07:17         Subjective:  no reports of vomiting, respiratory distress, uncontrolled pain. The patient is pleasantly confused. No fevers or chills.  Objective: Filed Vitals:   01/02/14 1335 01/02/14 2043 01/03/14 0437 01/03/14 1339  BP: 108/73 109/69 104/65 87/50  Pulse: 66 65 60 68  Temp: 97.1 F (36.2 C) 98 F (36.7 C) 97.9 F (36.6 C) 98.2 F (36.8 C)  TempSrc: Oral  Oral Oral Oral  Resp: 14 16 14 16   Height:      Weight:      SpO2: 98% 99% 100% 96%     Intake/Output Summary (Last 24 hours) at 01/03/14 1856 Last data filed at 01/03/14 1759  Gross per 24 hour  Intake 3364.83 ml  Output   1250 ml  Net 2114.83 ml   Weight change:  Exam:   General:  Pt is alert, follows commands appropriately, not in acute distress  HEENT: No icterus, No thrush,  /AT  Cardiovascular: RRR, S1/S2, no rubs, no gallops  Respiratory: diminished breath sounds but clear to auscultation. No wheezing   Abdomen: Soft/+BS, non tender, non distended, no guarding  Extremities: trace LE edema, No lymphangitis, No petechiae, No rashes, no synovitis  Data Reviewed: Basic Metabolic Panel:  Recent Labs Lab 12/29/13 1503 12/29/13 2045 12/30/13 0520 01/01/14 1723 01/02/14 0507  NA 171* 166* 161* 152* 144  K 3.6 3.7 4.1 4.4 5.3*  CL >130* >130* >130* 119* 118*  CO2 27 27 27 22  17*  GLUCOSE 91 153* 147* 208* 134*  BUN 52* 49* 41* 25* 21  CREATININE 1.48* 1.49* 1.26* 1.12* 1.24*  CALCIUM 9.0 8.9 9.0 9.2 8.5  MG  --  2.9*  --   --  2.4   Liver Function Tests:  Recent Labs Lab 12/28/13 2307 01/02/14 0507  AST 19 43*  ALT 6 10  ALKPHOS 102 95  BILITOT 0.5 2.2*  PROT 7.1 5.9*  ALBUMIN 3.3* 2.9*   No results for input(s): LIPASE, AMYLASE in the last 168 hours. No results for input(s): AMMONIA in the last 168 hours. CBC:  Recent Labs Lab 12/28/13 2023 12/29/13 0410  WBC 7.8 7.4  NEUTROABS 5.3  --   HGB 14.2 14.5  HCT 50.4* 53.5*  MCV 109.3* 111.2*  PLT 126* 142*   Cardiac Enzymes: No results for input(s): CKTOTAL, CKMB, CKMBINDEX, TROPONINI in the last 168 hours. BNP: Invalid input(s): POCBNP CBG:  Recent Labs Lab 12/29/13 1627 12/29/13 1647  GLUCAP 66* 116*    Recent Results (from the past 240 hour(s))  MRSA PCR Screening     Status: None   Collection Time: 12/29/13 10:59 AM  Result Value Ref Range Status   MRSA by PCR NEGATIVE NEGATIVE Final    Comment:        The GeneXpert MRSA Assay (FDA approved for NASAL  specimens only), is one component of a comprehensive MRSA colonization surveillance program. It is not intended to diagnose MRSA infection nor to guide or monitor treatment for MRSA infections.      Scheduled Meds: . chlorhexidine  15 mL Mouth Rinse BID  . diclofenac sodium  4 g Topical QID  . feeding supplement (ENSURE COMPLETE)  237 mL Oral BID BM  . latanoprost  1 drop Both Eyes QHS  . levothyroxine  75 mcg Oral QAC breakfast  . pantoprazole  40 mg Oral Q1200  . sodium chloride  3 mL Intravenous Q12H   Continuous Infusions: . dextrose 50 mL/hr at 01/03/14 0528     Telena Peyser, DO  [facility] Pager [phone]  If 7PM-7AM, please contact night-coverage [email or web address] Password [facility] 01/03/2014, 6:56 PM   LOS: 6 days

## 2014-01-04 LAB — BASIC METABOLIC PANEL
Anion gap: 4 — ABNORMAL LOW (ref 5–15)
BUN: 16 mg/dL (ref 6–23)
CO2: 23 mmol/L (ref 19–32)
CREATININE: 0.95 mg/dL (ref 0.50–1.10)
Calcium: 8.3 mg/dL — ABNORMAL LOW (ref 8.4–10.5)
Chloride: 113 mEq/L — ABNORMAL HIGH (ref 96–112)
GFR calc Af Amer: 65 mL/min — ABNORMAL LOW (ref >90)
GFR calc non Af Amer: 56 mL/min — ABNORMAL LOW (ref >90)
Glucose, Bld: 94 mg/dL (ref 70–99)
Potassium: 3.7 mmol/L (ref 3.5–5.1)
SODIUM: 140 mmol/L (ref 135–145)

## 2014-01-04 NOTE — Care Management Note (Signed)
CARE MANAGEMENT NOTE 01/04/2014  Patient:  OLUWASEYI, TULL   Account Number:  [id number]  Date Initiated:  12/29/2013  Documentation initiated by:  Dessa Phi  Subjective/Objective Assessment:   79 y/o f admitted w/hypernatremia.     Action/Plan:   From SNF.   Anticipated DC Date:  01/04/2014   Anticipated DC Plan:  [place]  CM consult      Choice offered to / List presented to:             Status of service:  In process, will continue to follow Medicare Important Message given?  YES (If response is "NO", the following Medicare IM given date fields will be blank) Date Medicare IM given:  01/04/2014 Medicare IM given by:  Marney Doctor Date Additional Medicare IM given:   Additional Medicare IM given by:    Discharge Disposition:    Per UR Regulation:  Reviewed for med. necessity/level of care/duration of stay  If discussed at [place] of Stay Meetings, dates discussed:    Comments:  01/04/14 Marney Doctor RN,BSN,NCM Pt to return to [facility] today.  No Cm needs.  12/29/13 Dessa Phi RN BSN NCM [phone] d/c plan return SNf-CSW already following.

## 2014-01-04 NOTE — Discharge Summary (Signed)
Physician Discharge Summary  [name] TLX:[id number] DOB: 1934-10-22 DOA: 12/28/2013  PCP: Hennie Duos, MD  Admit date: 12/28/2013 Discharge date: 01/04/2014  Recommendations for Outpatient Follow-up:  1. Pt will need to follow up with PCP in 2 weeks post discharge 2. Please obtain BMP to evaluate electrolytes and kidney function 3. 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 -Patient was started on hypotonic fluid and gradually improved -Sodium 140 on the day of discharge -Patient will require assistance with feeding and drinking -No PEG tube or artificial nutrition -will request palliative care follow up at SNF  -continue PRN morphine and ativan.  Acute on chronic renal failure (CKD3) -Baseline Cr 1.0-1.3  -Serum creatinine 0.95 on the day of discharge -upon admission 1.83 -Secondary dehydration -D5 IVF's re-initiated as per family petition -plan is to improve hydration as much as possible prior to discharge and family would be vigilant regarding patient PO intake at facility  Acute encephalopathy -Likely secondary to dehydration and hypernatremia -Patient's daughter states that the patient is at baseline(01/03/2014)  Hypothyroid  -will continue synthroid -TSH 0.537  Bipolar disorder/depression -will use PRN ativan -trazodone, invega and paxil discontinued currently -Paxil and trazodone were restarted after discharge  Insomnia -PRN ativan  GERD -continue  PPI  Hyperlipidemia -will discontinue statins -at this moment will have a conservative approach and limit to medications that can assist in maintaining comfortable state  Hypokalemia -repleted with IVF's -after slight improvement, family ask for more labs to be taken and to replete if needed -K 3.7--on the day of discharge  Dementia/Social  -possible progression of her dementia (per daughter patient's baseline has been declining over the past several months prior to admission and with really poor quality of life) -Discussed with daugther and patient will remain DNR/DNI -plan is for patient to go back to SNF and family will help providing care and attention  Glaucoma -continue Eye drops  Discharge Condition:  Stable Disposition:  skilled nursing facility  Diet: dysphagia 1 with thin liquids Wt Readings from Last 3 Encounters:  12/30/13 50.758 kg (111 lb 14.4 oz)  11/01/13 55.43 kg (122 lb 3.2 oz)  08/26/13 61.236 kg (135 lb)    History of present illness:  79 y.o. female  Per report from EMS and NH staff pt was noted to have hypnatremia to the 157s. This is a chronic ongoing issue for the pt. Pt is weak and non-verbal but seems to be getting weaker as of lat at the nursing home. Eating less as of late per NH staff. At the time of admission, the patient's serum sodium was 172. The patient was started on hypotonic fluids. She was encephalopathic and had periods of apnea. She was transferred to the stepdown unit temporarily for closer monitoring. As the patient's metabolic status improved, her mental status also improved. As result, the patient did not have any further episodes of apnea. Palliative medicine was consulted. They had meetings with the family. The family decided to make the patient DO NOT RESUSCITATE. However with the patient's clinical improvement they wanted to continue current medical therapy with  the feelings of discharging the patient back to his skilled nursing  facility. The patient improved clinically with fluid hydration. Her renal function improved. The patient's oral intake improved, but she continued to require assistance with feedings. At baseline, the patient is able to recognize her daughter and intermittently follows commands. In addition she only intermittently is able to answer with a few words.   Consultants: Palliative Care Medicine  Discharge Exam: Filed Vitals:   01/04/14 0441  BP: 102/53  Pulse: 65  Temp: 98.9 F (37.2 C)  Resp: 16   Filed Vitals:   01/03/14 0437 01/03/14 1339 01/03/14 2102 01/04/14 0441  BP: 104/65 87/50 90/62  102/53  Pulse: 60 68 70 65  Temp: 97.9 F (36.6 C) 98.2 F (36.8 C) 98.6 F (37 C) 98.9 F (37.2 C)  TempSrc: Oral Oral Oral Axillary  Resp: 14 16 16 16   Height:      Weight:      SpO2: 100% 96% 98% 99%   General: Awake and alert NAD, pleasant, cooperative Cardiovascular: RRR, no rub, no gallop, no S3 Respiratory: CTAB, no wheeze, no rhonchi Abdomen:soft, nontender, nondistended, positive bowel sounds Extremities: trace LE edema, No lymphangitis, no petechiae  Discharge Instructions  Discharge Instructions    Diet - low sodium heart healthy    Complete by:  As directed      Increase activity slowly    Complete by:  As directed             Medication List    STOP taking these medications        atorvastatin 10 MG tablet  Commonly known as:  LIPITOR     desmopressin 0.2 MG tablet  Commonly known as:  DDAVP     doxycycline 100 MG tablet  Commonly known as:  VIBRA-TABS     INVEGA 1.5 MG Tb24  Generic drug:  Paliperidone     OXYGEN      TAKE these medications        acetaminophen 325 MG tablet  Commonly known as:  TYLENOL  Take 2 tablets (650 mg total) by mouth every 6 (six) hours as needed for mild pain (or Fever >/= 101).     antiseptic oral rinse 0.05 % Liqd solution  Commonly known as:  CPC / CETYLPYRIDINIUM CHLORIDE 0.05%  7 mLs by Mouth Rinse route 2 times  daily at 12 noon and 4 pm.     LUMIGAN 0.01 % Soln  Generic drug:  bimatoprost  Place 1 drop into both eyes at bedtime.     bimatoprost 0.03 % ophthalmic solution  Commonly known as:  LUMIGAN  Place 1 drop into both eyes at bedtime.     bisacodyl 10 MG suppository  Commonly known as:  DULCOLAX  Place 1 suppository (10 mg total) rectally daily as needed for moderate constipation.     calcium-vitamin D 500-200 MG-UNIT per tablet  Commonly known as:  OSCAL WITH D  Take 2 tablets by mouth daily.     chlorhexidine 0.12 % solution  Commonly known as:  PERIDEX  15 mLs by Mouth Rinse route 2 (two) times daily.     cholecalciferol 1000 UNITS tablet  Commonly known as:  VITAMIN D  Take 1,000 Units by mouth daily.     diclofenac sodium 1 % Gel  Commonly known as:  VOLTAREN  Apply 4 g topically 4 (four) times daily. Apply to left shoulder and left knee     feeding supplement (ENSURE COMPLETE) Liqd  Take 237 mLs by mouth 2 (two) times daily between meals.     feeding supplement (PRO-STAT SUGAR FREE 64) Liqd  Take 30 mLs by mouth 3 (three) times daily with meals.     hydrOXYzine 10 MG tablet  Commonly known as:  ATARAX/VISTARIL  Take 10 mg by mouth 2 (two) times daily as needed for anxiety. For Anxiety     lansoprazole 30 MG capsule  Commonly known as:  PREVACID  Take 30 mg by mouth 2 (two) times daily.     levothyroxine 75 MCG tablet  Commonly known as:  SYNTHROID, LEVOTHROID  Take 1 tablet (75 mcg total) by mouth daily before breakfast.     multivitamin tablet  Take 1 tablet by mouth daily.     PARoxetine 20 MG tablet  Commonly known as:  PAXIL  Take 20 mg by mouth every morning.     polyethylene glycol packet  Commonly known as:  MIRALAX / GLYCOLAX  Take 17 g by mouth daily.     sennosides-docusate sodium 8.6-50 MG tablet  Commonly known as:  SENOKOT-S  Take 2 tablets by mouth at bedtime.     traZODone 50 MG tablet  Commonly known as:  DESYREL  Take 0.5 tablets  (25 mg total) by mouth at bedtime as needed (insomnia).     vitamin C 500 MG tablet  Commonly known as:  ASCORBIC ACID  Take 500 mg by mouth daily.         The results of significant diagnostics from this hospitalization (including imaging, microbiology, ancillary and laboratory) are listed below for reference.    Significant Diagnostic Studies: Ct Head Wo Contrast  12/30/2013   CLINICAL DATA:  79 year old diabetic hypertensive female with history of dementia presenting with increasing altered mental status. Initial encounter.  EXAM: CT HEAD WITHOUT CONTRAST  TECHNIQUE: Contiguous axial images were obtained from the base of the skull through the vertex without intravenous contrast.  COMPARISON:  05/07/2013 CT.  FINDINGS: New from the prior examination and of indeterminate is a right parietal-occipital lobe infarct. There may be laminar necrosis/petechial hemorrhage along the periphery of this infarct.  Small vessel disease type changes.  Atrophy without hydrocephalus.  No intracranial mass lesion noted on this unenhanced exam.  Vascular calcifications.  Banding left globe.  Mastoid air cells, middle ear cavities and visualized paranasal sinuses are clear. No calvarial abnormality.  IMPRESSION: New from the prior examination and of indeterminate is a right parietal-occipital lobe infarct. There may be laminar necrosis/petechial hemorrhage along the periphery of this infarct.  Small vessel disease type changes.  Atrophy without hydrocephalus.   Electronically Signed   By: Chauncey Cruel M.D.   On: 12/30/2013 07:17     Microbiology: Recent Results (from the past 240 hour(s))  MRSA PCR Screening     Status: None   Collection Time: 12/29/13 10:59 AM  Result Value Ref Range Status   MRSA by PCR NEGATIVE NEGATIVE Final    Comment:        The GeneXpert MRSA Assay (FDA approved for NASAL specimens only), is one component of a comprehensive MRSA colonization surveillance program. It is  not intended to diagnose MRSA infection nor to guide or monitor treatment for MRSA infections.      Labs: Basic Metabolic Panel:  Recent Labs Lab 12/29/13 2045 12/30/13 0520 01/01/14 1723 01/02/14 0507 01/04/14 0532  NA 166* 161* 152* 144 140  K 3.7 4.1 4.4 5.3* 3.7  CL >130* >130* 119* 118* 113*  CO2  27 27 22  17* 23  GLUCOSE 153* 147* 208* 134* 94  BUN 49* 41* 25* 21 16  CREATININE 1.49* 1.26* 1.12* 1.24* 0.95  CALCIUM 8.9 9.0 9.2 8.5 8.3*  MG 2.9*  --   --  2.4  --    Liver Function Tests:  Recent Labs Lab 12/28/13 2307 01/02/14 0507  AST 19 43*  ALT 6 10  ALKPHOS 102 95  BILITOT 0.5 2.2*  PROT 7.1 5.9*  ALBUMIN 3.3* 2.9*   No results for input(s): LIPASE, AMYLASE in the last 168 hours. No results for input(s): AMMONIA in the last 168 hours. CBC:  Recent Labs Lab 12/28/13 2023 12/29/13 0410  WBC 7.8 7.4  NEUTROABS 5.3  --   HGB 14.2 14.5  HCT 50.4* 53.5*  MCV 109.3* 111.2*  PLT 126* 142*   Cardiac Enzymes: No results for input(s): CKTOTAL, CKMB, CKMBINDEX, TROPONINI in the last 168 hours. BNP: Invalid input(s): POCBNP CBG:  Recent Labs Lab 12/29/13 1627 12/29/13 1647  GLUCAP 66* 116*    Time coordinating discharge:  Greater than 30 minutes  Signed:  Kearah Gayden, DO [facility] Pager: [phone] 01/04/2014, 9:38 AM

## 2014-01-04 NOTE — Progress Notes (Addendum)
Pt for discharge to [facility].   CSW facilitated pt discharge needs including contacting facility, faxing pt discharge information via [facility], discussing with pt daughter, [name] via telephone. Pt daughter discussed that she is interested in Hospice following at SNF. CSW discussed with pt daughter that Rober Minion MD has to give the order for hospice and CSW will notify [facility] liaison, but it will be best to follow up with Rober Minion upon pt arrival to ensure that facility is making referral to Hospice. Pt daughter expressed understanding. CSW left message for [facility] liaison, Danton Sewer and provided pt daughter with phone number to [facility]osp[facility] and [facility].  CSW provided RN phone number to call report, and arranging ambulance transport via [facility] for transport back to [facility].   No further social work needs identified at this time.  CSW signing off.   Alison Murray, MSW, [place] [facility] [phone]

## 2014-01-05 ENCOUNTER — Encounter: Payer: Self-pay | Admitting: Internal Medicine

## 2014-01-05 ENCOUNTER — Non-Acute Institutional Stay (SKILLED_NURSING_FACILITY): Payer: Medicare Other | Admitting: Internal Medicine

## 2014-01-05 DIAGNOSIS — E034 Atrophy of thyroid (acquired): Secondary | ICD-10-CM

## 2014-01-05 DIAGNOSIS — E870 Hyperosmolality and hypernatremia: Secondary | ICD-10-CM

## 2014-01-05 DIAGNOSIS — N183 Chronic kidney disease, stage 3 (moderate): Secondary | ICD-10-CM

## 2014-01-05 DIAGNOSIS — N179 Acute kidney failure, unspecified: Secondary | ICD-10-CM

## 2014-01-05 DIAGNOSIS — E785 Hyperlipidemia, unspecified: Secondary | ICD-10-CM

## 2014-01-05 DIAGNOSIS — E038 Other specified hypothyroidism: Secondary | ICD-10-CM

## 2014-01-05 DIAGNOSIS — F319 Bipolar disorder, unspecified: Secondary | ICD-10-CM

## 2014-01-05 DIAGNOSIS — K219 Gastro-esophageal reflux disease without esophagitis: Secondary | ICD-10-CM

## 2014-01-05 DIAGNOSIS — G9340 Encephalopathy, unspecified: Secondary | ICD-10-CM

## 2014-01-05 DIAGNOSIS — F0390 Unspecified dementia without behavioral disturbance: Secondary | ICD-10-CM

## 2014-01-05 NOTE — Assessment & Plan Note (Signed)
Continue pepcid

## 2014-01-05 NOTE — Assessment & Plan Note (Signed)
possible progression of her dementia (per daughter patient's baseline has been declining over the past several months prior to admission and with really poor quality of life) -Discussed with daugther and patient will remain DNR/DNI -plan is for patient to go back to SNF and family will help providing care and attention

## 2014-01-05 NOTE — Assessment & Plan Note (Signed)
will continue synthroid -TSH 0.537

## 2014-01-05 NOTE — Assessment & Plan Note (Signed)
Baseline Cr 1.0-1.3  -Serum creatinine 0.95 on the day of discharge -upon admission 1.83 -Secondary dehydration -D5 IVF's re-initiated as per family petition -plan is to improve hydration as much as possible prior to discharge and family would be vigilant regarding patient PO intake at facility The problem is not that pt is not offered fluid, the problem is she won't drink when offered;reported used to drink well and is cacable of picking up her cup but rarely does this now

## 2014-01-05 NOTE — Assessment & Plan Note (Signed)
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 -Patient was started on hypotonic fluid and gradually improved -Sodium 140 on the day of discharge -Patient will require assistance with feeding and drinking -No PEG tube or artificial nutrition -will request palliative care follow up at SNF  -continue PRN morphine and ativan.

## 2014-01-05 NOTE — Assessment & Plan Note (Signed)
will discontinue statins -at this moment will have a conservative approach and limit to medications that can assist in maintaining comfortable state

## 2014-01-05 NOTE — Assessment & Plan Note (Signed)
Likely secondary to dehydration and hypernatremia -Patient's daughter states that the patient is at baseline(01/03/2014)

## 2014-01-05 NOTE — Assessment & Plan Note (Signed)
will use PRN ativan -trazodone, invega and paxil discontinued currently -Paxil and trazodone were restarted after discharge

## 2014-01-05 NOTE — Progress Notes (Signed)
MRN: [id number] Name: [name]  Sex: female Age: 79 y.o. DOB: 09-04-1934  [place] #: [facility] Facility/Room: 413 Level Of Care: SNF Provider: Inocencio Homes D Emergency Contacts: Extended Emergency Contact Information Primary Emergency Contact: Buchanan,[name] Address: [address], [place] Phone: [phone] Relation: Daughter  Code Status: DNR  Allergies: Lithium; Penicillins; and Sulfa antibiotics  Chief Complaint  Patient presents with  . New Admit To SNF    HPI: Patient is 79 y.o. female who is admitted to SNF after being hospitalized for hypernatremia from poor po intake 2/2 endstage dementia. Two daughters here request Hospice consult.  Past Medical History  Diagnosis Date  . Thyroid cancer   . Ulcer     chronic  . Depression   . Vitamin D deficiency   . Anxiety   . Malaise and fatigue   . Osteoarthritis   . Kidney disease   . Pneumonia   . Dementia   . Alzheimer disease   . Hypothyroidism   . Bipolar 1 disorder   . Dehydration   . Hypertension   . Diabetes mellitus   . Pressure ulcer stage III   . Constipation   . Open-angle glaucoma   . Anemia   . Peripheral neuropathy   . Hypertensive heart disease   . CKD (chronic kidney disease), stage III   . Osteoarthrosis   . Mixed hyperlipidemia     Past Surgical History  Procedure Laterality Date  . Esophagogastroduodenoscopy  02/23/2011    Procedure: ESOPHAGOGASTRODUODENOSCOPY (EGD);  Surgeon: Beryle Beams, MD;  Location: [facility] ENDOSCOPY;  Service: Endoscopy;  Laterality: N/A;  . Abdominal hysterectomy    . Foot surgery    . Retinal detachment surgery    . Thyroidectomy        Medication List       This list is accurate as of: 01/05/14  7:56 PM.  Always use your most recent med list.               acetaminophen 325 MG tablet  Commonly known as:  TYLENOL  Take 2 tablets (650 mg total) by mouth every 6 (six) hours as needed for mild pain  (or Fever >/= 101).     antiseptic oral rinse 0.05 % Liqd solution  Commonly known as:  CPC / CETYLPYRIDINIUM CHLORIDE 0.05%  7 mLs by Mouth Rinse route 2 times daily at 12 noon and 4 pm.     bisacodyl 10 MG suppository  Commonly known as:  DULCOLAX  Place 1 suppository (10 mg total) rectally daily as needed for moderate constipation.     calcium-vitamin D 500-200 MG-UNIT per tablet  Commonly known as:  OSCAL WITH D  Take 2 tablets by mouth daily.     chlorhexidine 0.12 % solution  Commonly known as:  PERIDEX  15 mLs by Mouth Rinse route 2 (two) times daily.     cholecalciferol 1000 UNITS tablet  Commonly known as:  VITAMIN D  Take 1,000 Units by mouth daily.     diclofenac sodium 1 % Gel  Commonly known as:  VOLTAREN  Apply 4 g topically 4 (four) times daily. Apply to left shoulder and left knee     feeding supplement (ENSURE COMPLETE) Liqd  Take 237 mLs by mouth 2 (two) times daily between meals.     feeding supplement (PRO-STAT SUGAR FREE 64) Liqd  Take 30 mLs by mouth 3 (three) times  daily with meals.     hydrOXYzine 10 MG tablet  Commonly known as:  ATARAX/VISTARIL  Take 10 mg by mouth 2 (two) times daily as needed for anxiety. For Anxiety     lansoprazole 30 MG capsule  Commonly known as:  PREVACID  Take 30 mg by mouth 2 (two) times daily.     levothyroxine 75 MCG tablet  Commonly known as:  SYNTHROID, LEVOTHROID  Take 1 tablet (75 mcg total) by mouth daily before breakfast.     LUMIGAN 0.01 % Soln  Generic drug:  bimatoprost  Place 1 drop into both eyes at bedtime.     multivitamin tablet  Take 1 tablet by mouth daily.     PARoxetine 20 MG tablet  Commonly known as:  PAXIL  Take 20 mg by mouth every morning.     polyethylene glycol packet  Commonly known as:  MIRALAX / GLYCOLAX  Take 17 g by mouth daily.     sennosides-docusate sodium 8.6-50 MG tablet  Commonly known as:  SENOKOT-S  Take 2 tablets by mouth at bedtime.     traZODone 50 MG tablet   Commonly known as:  DESYREL  Take 0.5 tablets (25 mg total) by mouth at bedtime as needed (insomnia).     vitamin C 500 MG tablet  Commonly known as:  ASCORBIC ACID  Take 500 mg by mouth daily.        No orders of the defined types were placed in this encounter.    Immunization History  Administered Date(s) Administered  . Influenza Whole 10/16/2012  . Pneumococcal Conjugate-13 09/08/2007  . Pneumococcal-Unspecified 02/05/2011  . Tdap 09/08/2007  . Zoster 09/08/2007    History  Substance Use Topics  . Smoking status: Never Smoker   . Smokeless tobacco: Not on file  . Alcohol Use: No    Family history is noncontributory    Review of Systems  DATA OBTAINED: from daughters -no c/p pain , or any other sx;does not appear to be interested in eating or drinking but family is going to work with her and see how it goes day by day  Filed Vitals:   01/05/14 1935  BP: 124/79  Pulse: 79  Temp: 97 F (36.1 C)  Resp: 20    Physical Exam  GENERAL APPEARANCE: sleeping,  No acute distress.  SKIN: No diaphoresis rash HEAD: Normocephalic, atraumatic   EARS: External exam WNL, canals clear.  NOSE: No deformity or discharge.  MOUTH/THROAT: Lips w/o lesions  RESPIRATORY: Breathing is even, unlabored. Lung sounds are clear   CARDIOVASCULAR: Heart RRR no murmurs, rubs or gallops. No peripheral edema.   GASTROINTESTINAL: Abdomen is soft, non-tender, not distended w/ normal bowel sounds. GENITOURINARY: Bladder non tender, not distended  MUSCULOSKELETAL: No abnormal joints or musculature NEUROLOGIC:  Cranial nerves 2-12 grossly intact.  PSYCHIATRIC: daughters say at baseline; no behavioral issues  Patient Active Problem List   Diagnosis Date Noted  . Acute renal failure superimposed on stage 3 chronic kidney disease 01/03/2014  . Glaucoma   . Dyspnea 12/30/2013  . Pain, generalized 12/30/2013  . DNR (do not resuscitate) 12/30/2013  . Palliative care encounter 12/30/2013  .  AKI (acute kidney injury) 12/29/2013  . Acute encephalopathy 12/29/2013  . Acute renal failure syndrome   . Encephalopathy acute   . CKD (chronic kidney disease) stage 3, GFR 30-59 ml/min 11/13/2013  . Cellulitis of arm, left 11/13/2013  . Protein-calorie malnutrition, severe 11/02/2013  . Metabolic encephalopathy 41/58/3094  . 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 Alzheimer's disease 08/29/2013  . Thyroid activity decreased 08/29/2013  . Arthritis, senescent 08/29/2013  . Esophageal reflux 08/29/2013  . Chronic constipation 08/29/2013  . Bipolar 1 disorder, depressed 01/13/2013  . ARF (acute renal failure) 01/13/2013  . Moderate protein-calorie malnutrition 01/09/2013  . Normocytic anemia 01/09/2013  . Sacral decubitus ulcer, stage II 01/09/2013  . Hypernatremia 01/07/2013  . Dehydration 01/04/2013  . Hypokalemia 01/04/2013  . Hypothyroidism 01/04/2013  . Dementia without behavioral disturbance 12/08/2012  . Unspecified vitamin D deficiency 07/21/2012  . Anemia in chronic kidney disease 06/19/2012  . Dyslipidemia 06/03/2012  . Osteoarthritis 06/03/2012  . Manic depressive disorder 04/11/2012  . Unspecified constipation 04/11/2012  . GERD (gastroesophageal reflux disease) 04/11/2012  . Osteoporosis, unspecified 04/11/2012  . Diabetes insipidus -  lithium-induced 09/07/2011  . History of iron deficiency anemia 09/07/2011  . Acute blood loss anemia 09/06/2011  . Essential hypertension, benign 02/01/2011  . Depressive disorder, not elsewhere classified 02/01/2011  . Unspecified hypothyroidism 02/01/2011    CBC    Component Value Date/Time   WBC 7.4 12/29/2013 0410   RBC 4.81 12/29/2013 0410   RBC 4.43 09/06/2011 1955   HGB 14.5 12/29/2013 0410   HCT 53.5* 12/29/2013 0410   PLT 142* 12/29/2013 0410   MCV 111.2* 12/29/2013 0410   LYMPHSABS 1.7 12/28/2013 2023   MONOABS 0.6 12/28/2013 2023   EOSABS 0.1 12/28/2013 2023    BASOSABS 0.0 12/28/2013 2023    CMP     Component Value Date/Time   NA 140 01/04/2014 0532   K 3.7 01/04/2014 0532   CL 113* 01/04/2014 0532   CO2 23 01/04/2014 0532   GLUCOSE 94 01/04/2014 0532   BUN 16 01/04/2014 0532   CREATININE 0.95 01/04/2014 0532   CALCIUM 8.3* 01/04/2014 0532   CALCIUM 9.7 05/11/2008 0533   PROT 5.9* 01/02/2014 0507   ALBUMIN 2.9* 01/02/2014 0507   AST 43* 01/02/2014 0507   ALT 10 01/02/2014 0507   ALKPHOS 95 01/02/2014 0507   BILITOT 2.2* 01/02/2014 0507   GFRNONAA 56* 01/04/2014 0532   GFRAA 65* 01/04/2014 0532    Assessment and Plan  Hypernatremia Secondary to dehydration, from poor oral intake with component of chronic diabetes insipidus from long term lithium use -Has had chronic hypernatremia in past (last hospital admission in 10/2013) -has failed demonstrating capacity of hydration or appropriate intake/nutrition by herself  -family is interested now in looking after pt intake/hydration status at the facility and would like to give another chance. -Patient was started on hypotonic fluid and gradually improved -Sodium 140 on the day of discharge -Patient will require assistance with feeding and drinking -No PEG tube or artificial nutrition -will request palliative care follow up at SNF  -continue PRN morphine and ativan.  Acute renal failure superimposed on stage 3 chronic kidney disease Baseline Cr 1.0-1.3  -Serum creatinine 0.95 on the day of discharge -upon admission 1.83 -Secondary dehydration -D5 IVF's re-initiated as per family petition -plan is to improve hydration as much as possible prior to discharge and family would be vigilant regarding patient PO intake at facility The problem is not that pt is not offered fluid, the problem is she won't drink when offered;reported used to drink well and is cacable of picking up her cup but rarely does this now  Acute encephalopathy Likely secondary to dehydration and  hypernatremia -Patient's daughter states that the patient is at baseline(01/03/2014)   Hypothyroidism will continue synthroid -TSH  0.537  Bipolar 1 disorder, depressed will use PRN ativan -trazodone, invega and paxil discontinued currently -Paxil and trazodone were restarted after discharge  Dyslipidemia will discontinue statins -at this moment will have a conservative approach and limit to medications that can assist in maintaining comfortable state   GERD (gastroesophageal reflux disease) Continue pepcid  Dementia without behavioral disturbance possible progression of her dementia (per daughter patient's baseline has been declining over the past several months prior to admission and with really poor quality of life) -Discussed with daugther and patient will remain DNR/DNI -plan is for patient to go back to SNF and family will help providing care and attention    Hennie Duos, MD

## 2014-01-31 ENCOUNTER — Encounter: Payer: Self-pay | Admitting: Internal Medicine

## 2014-02-19 ENCOUNTER — Non-Acute Institutional Stay (SKILLED_NURSING_FACILITY): Payer: Medicare Other | Admitting: Internal Medicine

## 2014-02-19 ENCOUNTER — Encounter: Payer: Self-pay | Admitting: Internal Medicine

## 2014-02-19 DIAGNOSIS — F0390 Unspecified dementia without behavioral disturbance: Secondary | ICD-10-CM

## 2014-02-19 NOTE — Progress Notes (Signed)
MRN: [id number] Name: [name]  Sex: female Age: 80 y.o. DOB: 04/25/1934  [place] #: [facility] Level Of Care: SNF Provider: Inocencio Homes D Emergency Contacts: Extended Emergency Contact Information Primary Emergency Contact: Buchanan,[name] Address: [address], [place] Phone: [phone] Relation: Daughter  Code Status: DNR  Allergies: Lithium; Penicillins; and Sulfa antibiotics  Chief Complaint  Patient presents with  . Medical Management of Chronic Issues    HPI: Patient is 80 y.o. female who has endstage dementia on Hospice who is being seen because pt's daughter wants to take her to see her psychiatrist.  Past Medical History  Diagnosis Date  . Thyroid cancer   . Ulcer     chronic  . Depression   . Vitamin D deficiency   . Anxiety   . Malaise and fatigue   . Osteoarthritis   . Kidney disease   . Pneumonia   . Dementia   . Alzheimer disease   . Hypothyroidism   . Bipolar 1 disorder   . Dehydration   . Hypertension   . Diabetes mellitus   . Pressure ulcer stage III   . Constipation   . Open-angle glaucoma   . Anemia   . Peripheral neuropathy   . Hypertensive heart disease   . CKD (chronic kidney disease), stage III   . Osteoarthrosis   . Mixed hyperlipidemia     Past Surgical History  Procedure Laterality Date  . Esophagogastroduodenoscopy  02/23/2011    Procedure: ESOPHAGOGASTRODUODENOSCOPY (EGD);  Surgeon: Beryle Beams, MD;  Location: [facility] ENDOSCOPY;  Service: Endoscopy;  Laterality: N/A;  . Abdominal hysterectomy    . Foot surgery    . Retinal detachment surgery    . Thyroidectomy        Medication List       This list is accurate as of: 02/19/14 11:59 PM.  Always use your most recent med list.               acetaminophen 325 MG tablet  Commonly known as:  TYLENOL  Take 2 tablets (650 mg total) by mouth every 6 (six) hours as needed for mild pain (or Fever >/= 101).     antiseptic oral rinse 0.05 % Liqd solution  Commonly known as:  CPC / CETYLPYRIDINIUM CHLORIDE 0.05%  7 mLs by Mouth Rinse route 2 times daily at 12 noon and 4 pm.     bisacodyl 10 MG suppository  Commonly known as:  DULCOLAX  Place 1 suppository (10 mg total) rectally daily as needed for moderate constipation.     calcium-vitamin D 500-200 MG-UNIT per tablet  Commonly known as:  OSCAL WITH D  Take 2 tablets by mouth daily.     chlorhexidine 0.12 % solution  Commonly known as:  PERIDEX  15 mLs by Mouth Rinse route 2 (two) times daily.     cholecalciferol 1000 UNITS tablet  Commonly known as:  VITAMIN D  Take 1,000 Units by mouth daily.     diclofenac sodium 1 % Gel  Commonly known as:  VOLTAREN  Apply 4 g topically 4 (four) times daily. Apply to left shoulder and left knee     feeding supplement (ENSURE COMPLETE) Liqd  Take 237 mLs by mouth 2 (two) times daily between meals.     feeding supplement (PRO-STAT SUGAR FREE 64) Liqd  Take 30 mLs by mouth 3 (three) times daily with meals.  hydrOXYzine 10 MG tablet  Commonly known as:  ATARAX/VISTARIL  Take 10 mg by mouth 2 (two) times daily as needed for anxiety. For Anxiety     lansoprazole 30 MG capsule  Commonly known as:  PREVACID  Take 30 mg by mouth 2 (two) times daily.     levothyroxine 75 MCG tablet  Commonly known as:  SYNTHROID, LEVOTHROID  Take 1 tablet (75 mcg total) by mouth daily before breakfast.     LUMIGAN 0.01 % Soln  Generic drug:  bimatoprost  Place 1 drop into both eyes at bedtime.     multivitamin tablet  Take 1 tablet by mouth daily.     PARoxetine 20 MG tablet  Commonly known as:  PAXIL  Take 20 mg by mouth every morning.     polyethylene glycol packet  Commonly known as:  MIRALAX / GLYCOLAX  Take 17 g by mouth daily.     sennosides-docusate sodium 8.6-50 MG tablet  Commonly known as:  SENOKOT-S  Take 2 tablets by mouth at bedtime.     traZODone 50 MG tablet  Commonly known as:   DESYREL  Take 0.5 tablets (25 mg total) by mouth at bedtime as needed (insomnia).     vitamin C 500 MG tablet  Commonly known as:  ASCORBIC ACID  Take 500 mg by mouth daily.        No orders of the defined types were placed in this encounter.    Immunization History  Administered Date(s) Administered  . Influenza Whole 10/16/2012  . Pneumococcal Conjugate-13 09/08/2007  . Pneumococcal-Unspecified 02/05/2011  . Tdap 09/08/2007  . Zoster 09/08/2007    History  Substance Use Topics  . Smoking status: Never Smoker   . Smokeless tobacco: Not on file  . Alcohol Use: No    Review of Systems  DATA OBTAINED: from nurse, medical record GENERAL:  no fevers, eating and drinking pretty well, doing very well since hse has been on hospice SKIN: No itching, rash HEENT: No complaint RESPIRATORY: No cough, wheezing, SOB CARDIAC: No chest pain, palpitations, lower extremity edema  GI: No abdominal pain, No N/V/D or constipation, No heartburn or reflux  GU: No dysuria, frequency or urgency, or incontinence  MUSCULOSKELETAL: No unrelieved bone/joint pain NEUROLOGIC: No headache, dizziness  PSYCHIATRIC: No overt anxiety or sadness per pt  Filed Vitals:   02/19/14 1329  BP: 102/62  Pulse: 68  Temp: 97.4 F (36.3 C)  Resp: 18    Physical Exam  GENERAL APPEARANCE: Alert, min conversant, No acute distress, sitting in WC smiling  SKIN: No diaphoresis rash HEENT: Unremarkable RESPIRATORY: Breathing is even, unlabored. Lung sounds are clear   CARDIOVASCULAR: Heart RRR no murmurs, rubs or gallops. No peripheral edema  GASTROINTESTINAL: Abdomen is soft, non-tender, not distended w/ normal bowel sounds.  GENITOURINARY: Bladder non tender, not distended  MUSCULOSKELETAL: contractures LE NEUROLOGIC: Cranial nerves 2-12 grossly intact. PSYCHIATRIC: dementia , no behavioral issues  Patient Active Problem List   Diagnosis Date Noted  . Acute renal failure superimposed on stage 3 chronic  kidney disease 01/03/2014  . Glaucoma   . Dyspnea 12/30/2013  . Pain, generalized 12/30/2013  . DNR (do not resuscitate) 12/30/2013  . Palliative care encounter 12/30/2013  . AKI (acute kidney injury) 12/29/2013  . Acute encephalopathy 12/29/2013  . Acute renal failure syndrome   . Encephalopathy acute   . CKD (chronic kidney disease) stage 3, GFR 30-59 ml/min 11/13/2013  . Cellulitis of arm, left 11/13/2013  . Protein-calorie malnutrition,  severe 11/02/2013  . Metabolic encephalopathy 05/18/209  . Diabetes mellitus, type II with neurological and renal complications 17/35/6701  . Alzheimer's disease 08/29/2013  . Thyroid activity decreased 08/29/2013  . Arthritis, senescent 08/29/2013  . Esophageal reflux 08/29/2013  . Chronic constipation 08/29/2013  . Bipolar 1 disorder, depressed 01/13/2013  . ARF (acute renal failure) 01/13/2013  . Moderate protein-calorie malnutrition 01/09/2013  . Normocytic anemia 01/09/2013  . Sacral decubitus ulcer, stage II 01/09/2013  . Hypernatremia 01/07/2013  . Dehydration 01/04/2013  . Hypokalemia 01/04/2013  . Hypothyroidism 01/04/2013  . Dementia without behavioral disturbance 12/08/2012  . Unspecified vitamin D deficiency 07/21/2012  . Anemia in chronic kidney disease 06/19/2012  . Dyslipidemia 06/03/2012  . Osteoarthritis 06/03/2012  . Manic depressive disorder 04/11/2012  . Unspecified constipation 04/11/2012  . GERD (gastroesophageal reflux disease) 04/11/2012  . Osteoporosis, unspecified 04/11/2012  . Diabetes insipidus -  lithium-induced 09/07/2011  . History of iron deficiency anemia 09/07/2011  . Acute blood loss anemia 09/06/2011  . Essential hypertension, benign 02/01/2011  . Depressive disorder, not elsewhere classified 02/01/2011  . Unspecified hypothyroidism 02/01/2011    CBC    Component Value Date/Time   WBC 7.4 12/29/2013 0410   RBC 4.81 12/29/2013 0410   RBC 4.43 09/06/2011 1955   HGB 14.5 12/29/2013 0410   HCT  53.5* 12/29/2013 0410   PLT 142* 12/29/2013 0410   MCV 111.2* 12/29/2013 0410   LYMPHSABS 1.7 12/28/2013 2023   MONOABS 0.6 12/28/2013 2023   EOSABS 0.1 12/28/2013 2023   BASOSABS 0.0 12/28/2013 2023    CMP     Component Value Date/Time   NA 140 01/04/2014 0532   K 3.7 01/04/2014 0532   CL 113* 01/04/2014 0532   CO2 23 01/04/2014 0532   GLUCOSE 94 01/04/2014 0532   BUN 16 01/04/2014 0532   CREATININE 0.95 01/04/2014 0532   CALCIUM 8.3* 01/04/2014 0532   CALCIUM 9.7 05/11/2008 0533   PROT 5.9* 01/02/2014 0507   ALBUMIN 2.9* 01/02/2014 0507   AST 43* 01/02/2014 0507   ALT 10 01/02/2014 0507   ALKPHOS 95 01/02/2014 0507   BILITOT 2.2* 01/02/2014 0507   GFRNONAA 56* 01/04/2014 0532   GFRAA 65* 01/04/2014 0532    Assessment and Plan  Dementia without behavioral disturbance Since pt has been on Hospice she has done well. Nurses say that some days she will speak to you and some days not. She seems very content. I don't think that she would benefit from a psychiatrist but perhaps the daughter would? Will discuss pt with family.     Hennie Duos, MD

## 2014-02-22 ENCOUNTER — Encounter: Payer: Self-pay | Admitting: Internal Medicine

## 2014-02-22 NOTE — Assessment & Plan Note (Addendum)
Since pt has been on Hospice she has done well. Nurses say that some days she will speak to you and some days not. She seems very content. Taking her to a psychiatrist is OK, I'm not sure how helpful/relevant it may be. Will discuss daughter Bary Richard [phone]; called, no answer, left message.

## 2014-02-24 ENCOUNTER — Encounter: Payer: Self-pay | Admitting: Internal Medicine

## 2014-04-12 ENCOUNTER — Encounter: Payer: Self-pay | Admitting: Internal Medicine

## 2014-04-12 ENCOUNTER — Non-Acute Institutional Stay (SKILLED_NURSING_FACILITY): Payer: Medicare Other | Admitting: Internal Medicine

## 2014-04-12 DIAGNOSIS — E870 Hyperosmolality and hypernatremia: Secondary | ICD-10-CM

## 2014-04-12 DIAGNOSIS — F0390 Unspecified dementia without behavioral disturbance: Secondary | ICD-10-CM | POA: Diagnosis not present

## 2014-04-12 DIAGNOSIS — N179 Acute kidney failure, unspecified: Secondary | ICD-10-CM

## 2014-04-12 DIAGNOSIS — R21 Rash and other nonspecific skin eruption: Secondary | ICD-10-CM

## 2014-04-12 NOTE — Progress Notes (Signed)
Patient ID: [name], female   DOB: 01-29-1934, 80 y.o.   MRN: [id number] MRN: [id number] Name: [name]  Sex: female Age: 80 y.o. DOB: Sep 09, 1934  [place] #: [facility] Level Of Care: SNF Provider: Wille Celeste Emergency Contacts: Extended Emergency Contact Information Primary Emergency Contact: Buchanan,[name] Address: [address], [place] Phone: [phone] Relation: Daughter  Code Status: DNR  Allergies: Lithium; Penicillins; and Sulfa antibiotics  Chief Complaint  Patient presents with  . Medical Management of Chronic Issues   acute visit secondary to facial rash  HPI: Patient is 80 y.o. female who has endstage dementia on Hospice according in nursing staff she is actually been quite stable eats and drinks fairly well at times-although again this apparently is spotty.  She does have strong family support.  Nursing staff has noted a possible rash to her face.  She was hospitalized last year for for hypernatremia from poor by mouth intake and end-stage dementia-and she was discharged under hospice services  .  --  Past Medical History  Diagnosis Date  . Thyroid cancer   . Ulcer     chronic  . Depression   . Vitamin D deficiency   . Anxiety   . Malaise and fatigue   . Osteoarthritis   . Kidney disease   . Pneumonia   . Dementia   . Alzheimer disease   . Hypothyroidism   . Bipolar 1 disorder   . Dehydration   . Hypertension   . Diabetes mellitus   . Pressure ulcer stage III   . Constipation   . Open-angle glaucoma   . Anemia   . Peripheral neuropathy   . Hypertensive heart disease   . CKD (chronic kidney disease), stage III   . Osteoarthrosis   . Mixed hyperlipidemia     Past Surgical History  Procedure Laterality Date  . Esophagogastroduodenoscopy  02/23/2011    Procedure: ESOPHAGOGASTRODUODENOSCOPY (EGD);  Surgeon: Beryle Beams, MD;  Location: [facility] ENDOSCOPY;  Service: Endoscopy;   Laterality: N/A;  . Abdominal hysterectomy    . Foot surgery    . Retinal detachment surgery    . Thyroidectomy        Medication List       This list is accurate as of: 04/12/14  6:03 PM.  Always use your most recent med list.               acetaminophen 325 MG tablet  Commonly known as:  TYLENOL  Take 2 tablets (650 mg total) by mouth every 6 (six) hours as needed for mild pain (or Fever >/= 101).     antiseptic oral rinse 0.05 % Liqd solution  Commonly known as:  CPC / CETYLPYRIDINIUM CHLORIDE 0.05%  7 mLs by Mouth Rinse route 2 times daily at 12 noon and 4 pm.     bisacodyl 10 MG suppository  Commonly known as:  DULCOLAX  Place 1 suppository (10 mg total) rectally daily as needed for moderate constipation.     calcium-vitamin D 500-200 MG-UNIT per tablet  Commonly known as:  OSCAL WITH D  Take 2 tablets by mouth daily.     chlorhexidine 0.12 % solution  Commonly known as:  PERIDEX  15 mLs by Mouth Rinse route 2 (two) times daily.     cholecalciferol 1000 UNITS tablet  Commonly known as:  VITAMIN D  Take 1,000 Units by mouth daily.  diclofenac sodium 1 % Gel  Commonly known as:  VOLTAREN  Apply 4 g topically 4 (four) times daily. Apply to left shoulder and left knee     feeding supplement (ENSURE COMPLETE) Liqd  Take 237 mLs by mouth 2 (two) times daily between meals.     feeding supplement (PRO-STAT SUGAR FREE 64) Liqd  Take 30 mLs by mouth 3 (three) times daily with meals.     hydrOXYzine 10 MG tablet  Commonly known as:  ATARAX/VISTARIL  Take 10 mg by mouth 2 (two) times daily as needed for anxiety. For Anxiety     lansoprazole 30 MG capsule  Commonly known as:  PREVACID  Take 30 mg by mouth 2 (two) times daily.     levothyroxine 75 MCG tablet  Commonly known as:  SYNTHROID, LEVOTHROID  Take 1 tablet (75 mcg total) by mouth daily before breakfast.     LUMIGAN 0.01 % Soln  Generic drug:  bimatoprost  Place 1 drop into both eyes at bedtime.      multivitamin tablet  Take 1 tablet by mouth daily.     PARoxetine 20 MG tablet  Commonly known as:  PAXIL  Take 20 mg by mouth every morning.     polyethylene glycol packet  Commonly known as:  MIRALAX / GLYCOLAX  Take 17 g by mouth daily.     sennosides-docusate sodium 8.6-50 MG tablet  Commonly known as:  SENOKOT-S  Take 2 tablets by mouth at bedtime.     traZODone 50 MG tablet  Commonly known as:  DESYREL  Take 0.5 tablets (25 mg total) by mouth at bedtime as needed (insomnia).     vitamin C 500 MG tablet  Commonly known as:  ASCORBIC ACID  Take 500 mg by mouth daily.        No orders of the defined types were placed in this encounter.    Immunization History  Administered Date(s) Administered  . Influenza Whole 10/16/2012  . Pneumococcal Conjugate-13 09/08/2007  . Pneumococcal-Unspecified 02/05/2011  . Tdap 09/08/2007  . Zoster 09/08/2007    History  Substance Use Topics  . Smoking status: Never Smoker   . Smokeless tobacco: Not on file  . Alcohol Use: No    Review of Systems  DATA OBTAINED: from nurse, medical record GENERAL:  no fevers, eating and drinking pretty well, doing very well since hse has been on hospice SKIN: No itching, possible forehead. Rash HEENT: No complaint RESPIRATORY: No cough, wheezing, SOB CARDIAC: No chest pain, palpitations, lower extremity edema  GI: No abdominal pain, No N/V/D or constipation, No heartburn or reflux  GU: No dysuria, frequency or urgency, or incontinence  MUSCULOSKELETAL: No unrelieved bone/joint pain NEUROLOGIC: No headache, dizziness  PSYCHIATRIC: No overt anxiety or sadness   Filed Vitals:   04/12/14 1755  BP: 101/71  Pulse: 72  Temp: 98 F (36.7 C)  Resp: 17    Physical Exam  GENERAL APPEARANCE: Alert, min conversant, No acute distress, lying in bed smiling  SKIN: No diaphoresis has some scattered erythematous areas fore head-possibly some raised borders noted although this is somewhat  borderline HEENT: Unremarkable oropharynx is clear mucous membranes appear somewhat moist RESPIRATORY: Breathing is even, unlabored. Lung sounds are clear   CARDIOVASCULAR: Heart RRR no murmurs, rubs or gallops. No peripheral edema  GASTROINTESTINAL: Abdomen is soft, non-tender, not distended w/ normal bowel sounds.  GENITOURINARY: Bladder non tender, not distended  MUSCULOSKELETAL: contractures LE NEUROLOGIC: Cranial nerves 2-12 grossly intact. PSYCHIATRIC: dementia ,  no behavioral issues she did follow simple verbal commands  Patient Active Problem List   Diagnosis Date Noted  . Rash and nonspecific skin eruption 04/12/2014  . Acute renal failure superimposed on stage 3 chronic kidney disease 01/03/2014  . Glaucoma   . Dyspnea 12/30/2013  . Pain, generalized 12/30/2013  . DNR (do not resuscitate) 12/30/2013  . Palliative care encounter 12/30/2013  . AKI (acute kidney injury) 12/29/2013  . Acute encephalopathy 12/29/2013  . Acute renal failure syndrome   . Encephalopathy acute   . CKD (chronic kidney disease) stage 3, GFR 30-59 ml/min 11/13/2013  . Cellulitis of arm, left 11/13/2013  . Protein-calorie malnutrition, severe 11/02/2013  . Metabolic encephalopathy 25/85/2778  . 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 Alzheimer's disease 08/29/2013  . Thyroid activity decreased 08/29/2013  . Arthritis, senescent 08/29/2013  . Esophageal reflux 08/29/2013  . Chronic constipation 08/29/2013  . Bipolar 1 disorder, depressed 01/13/2013  . ARF (acute renal failure) 01/13/2013  . Moderate protein-calorie malnutrition 01/09/2013  . Normocytic anemia 01/09/2013  . Sacral decubitus ulcer, stage II 01/09/2013  . Hypernatremia 01/07/2013  . Dehydration 01/04/2013  . Hypokalemia 01/04/2013  . Hypothyroidism 01/04/2013  . Dementia without behavioral disturbance 12/08/2012  . Unspecified vitamin D deficiency 07/21/2012  . Anemia in chronic kidney  disease 06/19/2012  . Dyslipidemia 06/03/2012  . Osteoarthritis 06/03/2012  . Manic depressive disorder 04/11/2012  . Unspecified constipation 04/11/2012  . GERD (gastroesophageal reflux disease) 04/11/2012  . Osteoporosis, unspecified 04/11/2012  . Diabetes insipidus -  lithium-induced 09/07/2011  . History of iron deficiency anemia 09/07/2011  . Acute blood loss anemia 09/06/2011  . Essential hypertension, benign 02/01/2011  . Depressive disorder, not elsewhere classified 02/01/2011  . Unspecified hypothyroidism 02/01/2011    CBC    Component Value Date/Time   WBC 7.4 12/29/2013 0410   RBC 4.81 12/29/2013 0410   RBC 4.43 09/06/2011 1955   HGB 14.5 12/29/2013 0410   HCT 53.5* 12/29/2013 0410   PLT 142* 12/29/2013 0410   MCV 111.2* 12/29/2013 0410   LYMPHSABS 1.7 12/28/2013 2023   MONOABS 0.6 12/28/2013 2023   EOSABS 0.1 12/28/2013 2023   BASOSABS 0.0 12/28/2013 2023    CMP     Component Value Date/Time   NA 140 01/04/2014 0532   K 3.7 01/04/2014 0532   CL 113* 01/04/2014 0532   CO2 23 01/04/2014 0532   GLUCOSE 94 01/04/2014 0532   BUN 16 01/04/2014 0532   CREATININE 0.95 01/04/2014 0532   CALCIUM 8.3* 01/04/2014 0532   CALCIUM 9.7 05/11/2008 0533   PROT 5.9* 01/02/2014 0507   ALBUMIN 2.9* 01/02/2014 0507   AST 43* 01/02/2014 0507   ALT 10 01/02/2014 0507   ALKPHOS 95 01/02/2014 0507   BILITOT 2.2* 01/02/2014 0507   GFRNONAA 56* 01/04/2014 0532   GFRAA 65* 01/04/2014 0532    Assessment and Plan    #1-history of end-stage dementia-she is under hospice services actually appears to be doing quite well apparently she is eating and drinking somewhat stable -she appears to be doing fairly well with supportive care.  #2 history hypothyroidism she is on Synthroid last TSH was 0.537-will defer to hospice on whether to draw more labs if desired will update.  #3-history of bipolar 1 disorder depression this appears to be fairly stable she is on Paxil and  trazodone.  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  Acute renal failure-as noted above will defer labs to hospice discretion if okay would update a metabolic panel.  Facial rash?-This is somewhat challenging-she does have somewhat of a splotchy--raised borders?? appearance to the rash-we will do a trial course of Nizoral cream-avoid eyes-and monitor  [phone]  No problem-specific assessment & plan notes found for this encounter.   Isaiha Asare C, PA-C

## 2014-04-12 NOTE — Progress Notes (Signed)
This encounter was created in error - please disregard.

## 2014-04-22 LAB — BASIC METABOLIC PANEL
BUN: 21 mg/dL (ref 4–21)
CREATININE: 0.8 mg/dL (ref <=1.1)
Glucose: 293 mg/dL
Potassium: 4.7 mmol/L (ref 3.4–5.3)
SODIUM: 136 mmol/L — AB (ref 137–147)

## 2014-04-22 LAB — CBC AND DIFFERENTIAL
HCT: 30 % — AB (ref 36–46)
Hemoglobin: 9.3 g/dL — AB (ref 12.0–16.0)
PLATELETS: 230 10*3/uL (ref 150–399)
WBC: 4.6 10^3/mL

## 2014-04-22 LAB — TSH: TSH: 0.72 u[IU]/mL (ref <=5.90)

## 2014-04-22 LAB — HEPATIC FUNCTION PANEL
ALT: 5 U/L — AB (ref 7–35)
AST: 20 U/L (ref 13–35)
Alkaline Phosphatase: 52 U/L (ref 25–125)
Bilirubin, Total: 0.4 mg/dL

## 2014-04-26 ENCOUNTER — Non-Acute Institutional Stay (SKILLED_NURSING_FACILITY): Payer: Medicare Other | Admitting: Internal Medicine

## 2014-04-26 DIAGNOSIS — E232 Diabetes insipidus: Secondary | ICD-10-CM

## 2014-04-26 DIAGNOSIS — D631 Anemia in chronic kidney disease: Secondary | ICD-10-CM | POA: Diagnosis not present

## 2014-04-26 DIAGNOSIS — G309 Alzheimer's disease, unspecified: Secondary | ICD-10-CM

## 2014-04-26 DIAGNOSIS — R739 Hyperglycemia, unspecified: Secondary | ICD-10-CM

## 2014-04-26 DIAGNOSIS — N189 Chronic kidney disease, unspecified: Secondary | ICD-10-CM | POA: Diagnosis not present

## 2014-04-26 DIAGNOSIS — R21 Rash and other nonspecific skin eruption: Secondary | ICD-10-CM | POA: Diagnosis not present

## 2014-04-26 DIAGNOSIS — F0280 Dementia in other diseases classified elsewhere without behavioral disturbance: Secondary | ICD-10-CM

## 2014-04-26 NOTE — Progress Notes (Signed)
Patient ID: [name], female   DOB: [birth year], 80 y.o.   MRN: [id number] Patient ID: [name], female   DOB: 10-Sep-1934, 80 y.o.   MRN: [id number] MRN: [id number] Name: [name]  Sex: female Age: 80 y.o. DOB: 1934/09/19  [place] [name] Level Of Care: SNF Provider: Wille Celeste Emergency Contacts: Extended Emergency Contact Information Primary Emergency Contact: EricaAngie Address: [address], [place] of [place] Phone: [phone] Relation: Daughter  Code Status: DNR  Allergies: Lithium; Penicillins; and Sulfa antibiotics  Chief Complaint  Patient presents with  . Acute Visit   acute visit follow-up facial rash-question hyperglycemia-anemto  HPI: Patient is 80 y.o. female who has endstage dementia on Hospice according in nursing staff she is actually been quite stable eats and drinks fairly well at times-although again this apparently is spotty.  She does have strong family support.  I saw her earlier this month she had a facial rash we did prescribe a topical cream and on evaluation tonight this does appear to be significantly improved.    She was hospitalized last year for for hypernatremia from poor by mouth intake and end-stage dementia-and she was discharged under hospice services Her sodium on lab done on April 14 shows improvement at 136 this is stable he did incidentally show an elevated glucose of 293 per chart review it appears she does have a history of diabetes type 2 versus diabetes insipidus-I do not see blood sugars again medical management is quite conservative secondary to hospice care conservative therapy and wishes  I also note hemoglobin is 9.3 this appears to be a bit lower than her baseline back in December was 14.4 back in November was 12.4-again this could be somewhat related to possible hemo-concentration secondary to poor oral intake previously Again she is doing better in this regards with  her by mouth intake with strong family support.  Clinically she remainsstable    .  --  Past Medical History  Diagnosis Date  . Thyroid cancer   . Ulcer     chronic  . Depression   . Vitamin D deficiency   . Anxiety   . Malaise and fatigue   . Osteoarthritis   . Kidney disease   . Pneumonia   . Dementia   . Alzheimer disease   . Hypothyroidism   . Bipolar 1 disorder   . Dehydration   . Hypertension   . Diabetes mellitus   . Pressure ulcer stage III   . Constipation   . Open-angle glaucoma   . Anemia   . Peripheral neuropathy   . Hypertensive heart disease   . CKD (chronic kidney disease), stage III   . Osteoarthrosis   . Mixed hyperlipidemia     Past Surgical History  Procedure Laterality Date  . Esophagogastroduodenoscopy  02/23/2011    Procedure: ESOPHAGOGASTRODUODENOSCOPY (EGD);  Surgeon: Beryle Beams, MD;  Location: [facility] ENDOSCOPY;  Service: Endoscopy;  Laterality: N/A;  . Abdominal hysterectomy    . Foot surgery    . Retinal detachment surgery    . Thyroidectomy        Medication List       This list is accurate as of: 04/26/14 11:59 PM.  Always use your most recent med list.               acetaminophen 325 MG tablet  Commonly known as:  TYLENOL  Take 2 tablets (650 mg  total) by mouth every 6 (six) hours as needed for mild pain (or Fever >/= 101).     antiseptic oral rinse 0.05 % Liqd solution  Commonly known as:  CPC / CETYLPYRIDINIUM CHLORIDE 0.05%  7 mLs by Mouth Rinse route 2 times daily at 12 noon and 4 pm.     bisacodyl 10 MG suppository  Commonly known as:  DULCOLAX  Place 1 suppository (10 mg total) rectally daily as needed for moderate constipation.     calcium-vitamin D 500-200 MG-UNIT per tablet  Commonly known as:  OSCAL WITH D  Take 2 tablets by mouth daily.     chlorhexidine 0.12 % solution  Commonly known as:  PERIDEX  15 mLs by Mouth Rinse route 2 (two) times daily.     cholecalciferol 1000 UNITS tablet  Commonly  known as:  VITAMIN D  Take 1,000 Units by mouth daily.     diclofenac sodium 1 % Gel  Commonly known as:  VOLTAREN  Apply 4 g topically 4 (four) times daily. Apply to left shoulder and left knee     feeding supplement (ENSURE COMPLETE) Liqd  Take 237 mLs by mouth 2 (two) times daily between meals.     feeding supplement (PRO-STAT SUGAR FREE 64) Liqd  Take 30 mLs by mouth 3 (three) times daily with meals.     hydrOXYzine 10 MG tablet  Commonly known as:  ATARAX/VISTARIL  Take 10 mg by mouth 2 (two) times daily as needed for anxiety. For Anxiety     lansoprazole 30 MG capsule  Commonly known as:  PREVACID  Take 30 mg by mouth 2 (two) times daily.     levothyroxine 75 MCG tablet  Commonly known as:  SYNTHROID, LEVOTHROID  Take 1 tablet (75 mcg total) by mouth daily before breakfast.     LUMIGAN 0.01 % Soln  Generic drug:  bimatoprost  Place 1 drop into both eyes at bedtime.     multivitamin tablet  Take 1 tablet by mouth daily.     PARoxetine 20 MG tablet  Commonly known as:  PAXIL  Take 20 mg by mouth every morning.     polyethylene glycol packet  Commonly known as:  MIRALAX / GLYCOLAX  Take 17 g by mouth daily.     sennosides-docusate sodium 8.6-50 MG tablet  Commonly known as:  SENOKOT-S  Take 2 tablets by mouth at bedtime.     traZODone 50 MG tablet  Commonly known as:  DESYREL  Take 0.5 tablets (25 mg total) by mouth at bedtime as needed (insomnia).     vitamin C 500 MG tablet  Commonly known as:  ASCORBIC ACID  Take 500 mg by mouth daily.        No orders of the defined types were placed in this encounter.    Immunization History  Administered Date(s) Administered  . Influenza Whole 10/16/2012  . Pneumococcal Conjugate-13 09/08/2007  . Pneumococcal-Unspecified 02/05/2011  . Tdap 09/08/2007  . Zoster 09/08/2007    History  Substance Use Topics  . Smoking status: Never Smoker   . Smokeless tobacco: Not on file  . Alcohol Use: No    Review of  Systems  DATA OBTAINED: from nurse, medical record GENERAL:  no fevers, eating and drinking pretty well, doing very well since she has been on hospice SKIN: No itching, possible forehead. Rash that appears improved HEENT: No complaint RESPIRATORY: No cough, wheezing, SOB CARDIAC: No chest pain, palpitations, lower extremity edema  GI: No abdominal  pain, No N/V/D or constipation, No heartburn or reflux  GU: No dysuria, frequency or urgency, or incontinence  MUSCULOSKELETAL: No unrelieved bone/joint pain NEUROLOGIC: No headache, dizziness  PSYCHIATRIC: No overt anxiety or sadness   Filed Vitals:   05/04/14 1918  BP: 124/80  Pulse: 80  Resp: 18    Physical Exam  GENERAL APPEARANCE: Alert, min conversant, No acute distress, lying in bed smiling  SKIN: No diaphoresis h Scattered erythematous areas previously seen on fore head appear to be resolved HEENT: Unremarkable oropharynx is clear mucous membranes appear somewhat moist RESPIRATORY: Breathing is even, unlabored. Lung sounds are clear   CARDIOVASCULAR: Heart RRR no murmurs, rubs or gallops. No peripheral edema  GASTROINTESTINAL: Abdomen is soft, non-tender, not distended w/ normal bowel sounds.  GENITOURINARY: Bladder non tender, not distended  MUSCULOSKELETAL: contractures LE NEUROLOGIC: Cranial nerves 2-12 grossly intact. PSYCHIATRIC: dementia , no behavioral issues she did follow simple verbal commands  Patient Active Problem List   Diagnosis Date Noted  . Hyperglycemia 05/04/2014  . Rash and nonspecific skin eruption 04/12/2014  . Acute renal failure superimposed on stage 3 chronic kidney disease 01/03/2014  . Glaucoma   . Dyspnea 12/30/2013  . Pain, generalized 12/30/2013  . DNR (do not resuscitate) 12/30/2013  . Palliative care encounter 12/30/2013  . AKI (acute kidney injury) 12/29/2013  . Acute encephalopathy 12/29/2013  . Acute renal failure syndrome   . Encephalopathy acute   . CKD (chronic kidney disease)  stage 3, GFR 30-59 ml/min 11/13/2013  . Cellulitis of arm, left 11/13/2013  . Protein-calorie malnutrition, severe 11/02/2013  . Metabolic encephalopathy 16/10/9602  . 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 Alzheimer's disease 08/29/2013  . Thyroid activity decreased 08/29/2013  . Arthritis, senescent 08/29/2013  . Esophageal reflux 08/29/2013  . Chronic constipation 08/29/2013  . Bipolar 1 disorder, depressed 01/13/2013  . ARF (acute renal failure) 01/13/2013  . Moderate protein-calorie malnutrition 01/09/2013  . Normocytic anemia 01/09/2013  . Sacral decubitus ulcer, stage II 01/09/2013  . Hypernatremia 01/07/2013  . Dehydration 01/04/2013  . Hypokalemia 01/04/2013  . Hypothyroidism 01/04/2013  . Dementia without behavioral disturbance 12/08/2012  . Unspecified vitamin D deficiency 07/21/2012  . Anemia in chronic kidney disease 06/19/2012  . Dyslipidemia 06/03/2012  . Osteoarthritis 06/03/2012  . Manic depressive disorder 04/11/2012  . Unspecified constipation 04/11/2012  . GERD (gastroesophageal reflux disease) 04/11/2012  . Osteoporosis, unspecified 04/11/2012  . Diabetes insipidus -  lithium-induced 09/07/2011  . History of iron deficiency anemia 09/07/2011  . Acute blood loss anemia 09/06/2011  . Essential hypertension, benign 02/01/2011  . Depressive disorder, not elsewhere classified 02/01/2011  . Unspecified hypothyroidism 02/01/2011    Labs.  04/22/2014.  Sodium 136 potassium 4.7 BUN 21 creatinine 0.75 glucose 293.  Albumin 3.22 otherwise liver function tests within normal limits.  Hemoglobin 9.3 platelets 2:30 WBC 4.6  CBC    Component Value Date/Time   WBC 7.4 12/29/2013 0410   RBC 4.81 12/29/2013 0410   RBC 4.43 09/06/2011 1955   HGB 14.5 12/29/2013 0410   HCT 53.5* 12/29/2013 0410   PLT 142* 12/29/2013 0410   MCV 111.2* 12/29/2013 0410   LYMPHSABS 1.7 12/28/2013 2023   MONOABS 0.6 12/28/2013 2023    EOSABS 0.1 12/28/2013 2023   BASOSABS 0.0 12/28/2013 2023    CMP     Component Value Date/Time   NA 140 01/04/2014 0532   K 3.7 01/04/2014 0532   CL 113* 01/04/2014 0532   CO2 23  01/04/2014 0532   GLUCOSE 94 01/04/2014 0532   BUN 16 01/04/2014 0532   CREATININE 0.95 01/04/2014 0532   CALCIUM 8.3* 01/04/2014 0532   CALCIUM 9.7 05/11/2008 0533   PROT 5.9* 01/02/2014 0507   ALBUMIN 2.9* 01/02/2014 0507   AST 43* 01/02/2014 0507   ALT 10 01/02/2014 0507   ALKPHOS 95 01/02/2014 0507   BILITOT 2.2* 01/02/2014 0507   GFRNONAA 56* 01/04/2014 0532   GFRAA 65* 01/04/2014 0532    Assessment and Plan    #1-history of end-stage dementia-she is under hospice services actually appears to be doing quite well apparently she is eating and drinking somewhat stable -she appears to be doing fairly well with supportive care--again conservative follow-up is desired-.  #2 history hypothyroidism she is on Synthroid TSH on lab done this month was 0.718-     #3-history of hypernatremia secondary to dehydration failure to thrive with component of a chronic diabetes insipidus from long-term lithium use-at this point appears to be stable apparently eating and drinking better than before her hospitalization-update labs show stability    #4 Facial rash --This appears essentially resolved   #5-question hyperglycemia-will check CBGs Monday Wednesday Friday to keep an eye on this again minimally invasive procedures are desired-she is not on any medication.  I will also order order to CBG for this evening.  #6 anemia-again previous readings may be due to hemo- concentration-at this point will monitor again secondary to patient being under hospice care clinically stable with minimally invasive procedures desired  [id number]  No problem-specific assessment & plan notes found for this encounter.   Lakeysha Slutsky C, PA-C

## 2014-05-04 ENCOUNTER — Encounter: Payer: Self-pay | Admitting: Internal Medicine

## 2014-05-04 DIAGNOSIS — R739 Hyperglycemia, unspecified: Secondary | ICD-10-CM | POA: Insufficient documentation

## 2014-05-25 ENCOUNTER — Encounter: Payer: Self-pay | Admitting: *Deleted

## 2014-05-26 ENCOUNTER — Encounter: Payer: Self-pay | Admitting: Internal Medicine

## 2014-05-26 ENCOUNTER — Non-Acute Institutional Stay (SKILLED_NURSING_FACILITY): Payer: Medicare Other | Admitting: Internal Medicine

## 2014-05-26 DIAGNOSIS — K5900 Constipation, unspecified: Secondary | ICD-10-CM | POA: Diagnosis not present

## 2014-05-26 DIAGNOSIS — E890 Postprocedural hypothyroidism: Secondary | ICD-10-CM | POA: Diagnosis not present

## 2014-05-26 DIAGNOSIS — K5909 Other constipation: Secondary | ICD-10-CM

## 2014-05-26 NOTE — Assessment & Plan Note (Signed)
The worst I've ever seen. Only 2 places to go, fiber and if these fecal impaction are a sx of thyroid dx, replacement.

## 2014-05-26 NOTE — Progress Notes (Signed)
MRN: [id number] Name: [name]  Sex: female Age: 80 y.o. DOB: [birth year]  [place] #: [facility] Facility/Room: Level Of Care: SNF Provider: Inocencio Homes D Emergency Contacts: Extended Emergency Contact Information Primary Emergency Contact: Buchanan,[name] Address: [address], [place] Phone: [phone] Relation: Daughter  Code Status: DNR  Allergies: Lithium; Penicillins; and Sulfa antibiotics  Chief Complaint  Patient presents with  . Acute Visit    HPI: Patient is 80 y.o. female who Hospice has asked me to see for constipation. Pt is on multiple medications for constipation and yet,has a fecal impaction at least weekly and gets a decreased appetite and po intake goes down when she gets so bloated. No vomiting.  Past Medical History  Diagnosis Date  . Thyroid cancer   . Ulcer     chronic  . Depression   . Vitamin D deficiency   . Anxiety   . Malaise and fatigue   . Osteoarthritis   . Kidney disease   . Pneumonia   . Dementia   . Alzheimer disease   . Hypothyroidism   . Bipolar 1 disorder   . Dehydration   . Hypertension   . Diabetes mellitus   . Pressure ulcer stage III   . Constipation   . Open-angle glaucoma   . Anemia   . Peripheral neuropathy   . Hypertensive heart disease   . CKD (chronic kidney disease), stage III   . Osteoarthrosis   . Mixed hyperlipidemia     Past Surgical History  Procedure Laterality Date  . Esophagogastroduodenoscopy  02/23/2011    Procedure: ESOPHAGOGASTRODUODENOSCOPY (EGD);  Surgeon: Beryle Beams, MD;  Location: [facility] ENDOSCOPY;  Service: Endoscopy;  Laterality: N/A;  . Abdominal hysterectomy    . Foot surgery    . Retinal detachment surgery    . Thyroidectomy        Medication List       This list is accurate as of: 05/26/14  7:55 PM.  Always use your most recent med list.               acetaminophen 325 MG tablet  Commonly known as:  TYLENOL  Take 2  tablets (650 mg total) by mouth every 6 (six) hours as needed for mild pain (or Fever >/= 101).     antiseptic oral rinse 0.05 % Liqd solution  Commonly known as:  CPC / CETYLPYRIDINIUM CHLORIDE 0.05%  7 mLs by Mouth Rinse route 2 times daily at 12 noon and 4 pm.     bisacodyl 10 MG suppository  Commonly known as:  DULCOLAX  Place 1 suppository (10 mg total) rectally daily as needed for moderate constipation.     calcium-vitamin D 500-200 MG-UNIT per tablet  Commonly known as:  OSCAL WITH D  Take 2 tablets by mouth daily.     chlorhexidine 0.12 % solution  Commonly known as:  PERIDEX  15 mLs by Mouth Rinse route 2 (two) times daily.     cholecalciferol 1000 UNITS tablet  Commonly known as:  VITAMIN D  Take 1,000 Units by mouth daily.     diclofenac sodium 1 % Gel  Commonly known as:  VOLTAREN  Apply 4 g topically 4 (four) times daily. Apply to left shoulder and left knee     feeding supplement (ENSURE COMPLETE) Liqd  Take 237 mLs by mouth 2 (two) times daily between meals.     feeding  supplement (PRO-STAT SUGAR FREE 64) Liqd  Take 30 mLs by mouth 3 (three) times daily with meals.     hydrOXYzine 10 MG tablet  Commonly known as:  ATARAX/VISTARIL  Take 10 mg by mouth 2 (two) times daily as needed for anxiety. For Anxiety     ketoconazole 2 % cream  Commonly known as:  NIZORAL  Apply 1 application topically daily. For rash     lansoprazole 30 MG capsule  Commonly known as:  PREVACID  Take 30 mg by mouth 2 (two) times daily.     levothyroxine 75 MCG tablet  Commonly known as:  SYNTHROID, LEVOTHROID  Take 1 tablet (75 mcg total) by mouth daily before breakfast.     LUMIGAN 0.01 % Soln  Generic drug:  bimatoprost  Place 1 drop into both eyes at bedtime. For glaucoma     multivitamin tablet  Take 1 tablet by mouth daily.     PARoxetine 20 MG tablet  Commonly known as:  PAXIL  Take 20 mg by mouth every morning. For depression     polyethylene glycol packet   Commonly known as:  MIRALAX / GLYCOLAX  Take 17 g by mouth daily.     sennosides-docusate sodium 8.6-50 MG tablet  Commonly known as:  SENOKOT-S  Take 2 tablets by mouth at bedtime. For constipation     traZODone 50 MG tablet  Commonly known as:  DESYREL  Take 0.5 tablets (25 mg total) by mouth at bedtime as needed (insomnia).     vitamin C 500 MG tablet  Commonly known as:  ASCORBIC ACID  Take 500 mg by mouth daily.        No orders of the defined types were placed in this encounter.    Immunization History  Administered Date(s) Administered  . Influenza Whole 10/16/2012  . Pneumococcal Conjugate-13 09/08/2007  . Pneumococcal-Unspecified 02/05/2011  . Tdap 09/08/2007  . Zoster 09/08/2007    History  Substance Use Topics  . Smoking status: Never Smoker   . Smokeless tobacco: Not on file  . Alcohol Use: No    Review of Systems  DATA OBTAINED: from  nurse, medical record GENERAL:  no fevers, fatigue, appetite changes SKIN: No itching, rash HEENT: No complaint RESPIRATORY: No cough, wheezing, SOB CARDIAC: No chest pain, palpitations, lower extremity edema  GI: constipation as above GU: No dysuria, frequency or urgency, or incontinence  MUSCULOSKELETAL: No unrelieved bone/joint pain NEUROLOGIC: No headache, dizziness  PSYCHIATRIC: No overt anxiety or sadness  Filed Vitals:   05/26/14 1931  BP: 108/75  Pulse: 70  Temp: 99.4 F (37.4 C)  Resp: 20    Physical Exam  GENERAL APPEARANCE: Alert, WF detting disempacted SKIN: No diaphoresis rash, or wounds HEENT: Unremarkable RESPIRATORY: Breathing is even, unlabored. CARDIOVASCULAR: . No peripheral edema  GASTROINTESTINAL: Abdomen is soft, distended and being kneeded to facilitate stool movement  GENITOURINARY: Bladder non tender, not distended  MUSCULOSKELETAL: No abnormal joints or musculature NEUROLOGIC: Cranial nerves 2-12 grossly intact. PSYCHIATRIC: dementia, endstage, no behavioral issues  Patient  Active Problem List   Diagnosis Date Noted  . Hyperglycemia 05/04/2014  . Rash and nonspecific skin eruption 04/12/2014  . Acute renal failure superimposed on stage 3 chronic kidney disease 01/03/2014  . Glaucoma   . Dyspnea 12/30/2013  . Pain, generalized 12/30/2013  . DNR (do not resuscitate) 12/30/2013  . Palliative care encounter 12/30/2013  . AKI (acute kidney injury) 12/29/2013  . Acute encephalopathy 12/29/2013  . Acute renal failure syndrome   .  Encephalopathy acute   . CKD (chronic kidney disease) stage 3, GFR 30-59 ml/min 11/13/2013  . Cellulitis of arm, left 11/13/2013  . Protein-calorie malnutrition, severe 11/02/2013  . Metabolic encephalopathy 67/34/1937  . 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 Alzheimer's disease 08/29/2013  . Thyroid activity decreased 08/29/2013  . Arthritis, senescent 08/29/2013  . Esophageal reflux 08/29/2013  . Chronic constipation 08/29/2013  . Bipolar 1 disorder, depressed 01/13/2013  . ARF (acute renal failure) 01/13/2013  . Moderate protein-calorie malnutrition 01/09/2013  . Normocytic anemia 01/09/2013  . Sacral decubitus ulcer, stage II 01/09/2013  . Hypernatremia 01/07/2013  . Dehydration 01/04/2013  . Hypokalemia 01/04/2013  . Post-surgical hypothyroidism 01/04/2013  . Dementia without behavioral disturbance 12/08/2012  . Unspecified vitamin D deficiency 07/21/2012  . Anemia in chronic kidney disease 06/19/2012  . Dyslipidemia 06/03/2012  . Osteoarthritis 06/03/2012  . Manic depressive disorder 04/11/2012  . Unspecified constipation 04/11/2012  . GERD (gastroesophageal reflux disease) 04/11/2012  . Osteoporosis, unspecified 04/11/2012  . Diabetes insipidus -  lithium-induced 09/07/2011  . History of iron deficiency anemia 09/07/2011  . Acute blood loss anemia 09/06/2011  . Essential hypertension, benign 02/01/2011  . Depressive disorder, not elsewhere classified 02/01/2011  .  Unspecified hypothyroidism 02/01/2011    CBC    Component Value Date/Time   WBC 4.6 04/22/2014   WBC 7.4 12/29/2013 0410   RBC 4.81 12/29/2013 0410   RBC 4.43 09/06/2011 1955   HGB 9.3* 04/22/2014   HCT 30* 04/22/2014   PLT 230 04/22/2014   MCV 111.2* 12/29/2013 0410   LYMPHSABS 1.7 12/28/2013 2023   MONOABS 0.6 12/28/2013 2023   EOSABS 0.1 12/28/2013 2023   BASOSABS 0.0 12/28/2013 2023    CMP     Component Value Date/Time   NA 136* 04/22/2014   NA 140 01/04/2014 0532   K 4.7 04/22/2014   CL 113* 01/04/2014 0532   CO2 23 01/04/2014 0532   GLUCOSE 94 01/04/2014 0532   BUN 21 04/22/2014   BUN 16 01/04/2014 0532   CREATININE 0.8 04/22/2014   CREATININE 0.95 01/04/2014 0532   CALCIUM 8.3* 01/04/2014 0532   CALCIUM 9.7 05/11/2008 0533   PROT 5.9* 01/02/2014 0507   ALBUMIN 2.9* 01/02/2014 0507   AST 20 04/22/2014   ALT 5* 04/22/2014   ALKPHOS 52 04/22/2014   BILITOT 2.2* 01/02/2014 0507   GFRNONAA 56* 01/04/2014 0532   GFRAA 65* 01/04/2014 0532    Assessment and Plan  Post-surgical hypothyroidism Pt is Hospice but with such severe constipation it will be very worthwhile to check thyroid function.   Chronic constipation The worst I've ever seen. Only 2 places to go, fiber and if these fecal impaction are a sx of thyroid dx, replacement.     Hennie Duos, MD

## 2014-05-26 NOTE — Assessment & Plan Note (Signed)
Pt is Hospice but with such severe constipation it will be very worthwhile to check thyroid function.

## 2014-09-02 ENCOUNTER — Encounter: Payer: Self-pay | Admitting: Internal Medicine

## 2014-09-02 ENCOUNTER — Non-Acute Institutional Stay (SKILLED_NURSING_FACILITY): Payer: Medicare Other | Admitting: Internal Medicine

## 2014-09-02 DIAGNOSIS — F0390 Unspecified dementia without behavioral disturbance: Secondary | ICD-10-CM | POA: Diagnosis not present

## 2014-09-02 DIAGNOSIS — E038 Other specified hypothyroidism: Secondary | ICD-10-CM | POA: Diagnosis not present

## 2014-09-02 DIAGNOSIS — K5909 Other constipation: Secondary | ICD-10-CM

## 2014-09-02 DIAGNOSIS — K5900 Constipation, unspecified: Secondary | ICD-10-CM

## 2014-09-02 NOTE — Progress Notes (Signed)
Patient ID: [name], female   DOB: 1934/02/17, 80 y.o.   MRN: [id number]   MRN: [id number] Name: [name]  Sex: female Age: 80 y.o. DOB: 15-Sep-1934  [place] #: [facility] Level Of Care: SNF Provider: Wille Celeste Emergency Contacts: Extended Emergency Contact Information Primary Emergency Contact: Buchanan,[name] Address: [address], [place] Phone: [phone] Relation: Daughter  Code Status: DNR  Allergies: Lithium; Penicillins; and Sulfa antibiotics  Chief Complaint  Patient presents with  . Medical Management of Chronic Issues   acute visit follow-up bruising to left great toe  HPI: Patient is 80 y.o. female who has endstage dementia on Hospice-apparently she has been quite stable she did have a fall earlier this week-with no apparent injuries she does have some bruising of her left great toe but this does not appear to be painful-I did not note any deformity.  She is followed closely by hospice in fact hospice nurse saw her today --she continues to have at times chronic constipation she is on numerous agents lactulose Dulcolax suppository Senokot-S apparently she had a large BM today.    She was hospitalized last year for for hypernatremia from poor by mouth intake and end-stage dementia-and she was discharged under hospice services Her sodium on lab done on April 14 shows improvement at 136 this is stable he did incidentally show an elevated glucose of 293 per chart review it appears she does have a history of diabetes type 2 versus diabetes insipidus-I do not see blood sugars again medical management is quite conservative secondary to hospice care conservative therapy and wishes  I also note hemoglobin was 9.3 this appears to be a bit lower than her baseline back in December was 14.4 back in November was 12.4-again this could be somewhat related to possible hemo-concentration secondary to poor oral intake  previously Again she is doing better in this regards with her by mouth intake with strong family support.      .  --  Past Medical History  Diagnosis Date  . Thyroid cancer   . Ulcer     chronic  . Depression   . Vitamin D deficiency   . Anxiety   . Malaise and fatigue   . Osteoarthritis   . Kidney disease   . Pneumonia   . Dementia   . Alzheimer disease   . Hypothyroidism   . Bipolar 1 disorder   . Dehydration   . Hypertension   . Diabetes mellitus   . Pressure ulcer stage III   . Constipation   . Open-angle glaucoma   . Anemia   . Peripheral neuropathy   . Hypertensive heart disease   . CKD (chronic kidney disease), stage III   . Osteoarthrosis   . Mixed hyperlipidemia     Past Surgical History  Procedure Laterality Date  . Esophagogastroduodenoscopy  02/23/2011    Procedure: ESOPHAGOGASTRODUODENOSCOPY (EGD);  Surgeon: Beryle Beams, MD;  Location: [facility] ENDOSCOPY;  Service: Endoscopy;  Laterality: N/A;  . Abdominal hysterectomy    . Foot surgery    . Retinal detachment surgery    . Thyroidectomy        Medication List       This list is accurate as of: 09/02/14  1:42 PM.  Always use your most recent med list.               acetaminophen 325 MG tablet  Commonly  known as:  TYLENOL  Take 2 tablets (650 mg total) by mouth every 6 (six) hours as needed for mild pain (or Fever >/= 101).     antiseptic oral rinse 0.05 % Liqd solution  Commonly known as:  CPC / CETYLPYRIDINIUM CHLORIDE 0.05%  7 mLs by Mouth Rinse route 2 times daily at 12 noon and 4 pm.     bisacodyl 10 MG suppository  Commonly known as:  DULCOLAX  Place 1 suppository (10 mg total) rectally daily as needed for moderate constipation.     calcium-vitamin D 500-200 MG-UNIT per tablet  Commonly known as:  OSCAL WITH D  Take 2 tablets by mouth daily.     chlorhexidine 0.12 % solution  Commonly known as:  PERIDEX  15 mLs by Mouth Rinse route 2 (two) times daily.     cholecalciferol  1000 UNITS tablet  Commonly known as:  VITAMIN D  Take 1,000 Units by mouth daily.     diclofenac sodium 1 % Gel  Commonly known as:  VOLTAREN  Apply 4 g topically 4 (four) times daily. Apply to left shoulder and left knee     feeding supplement (ENSURE COMPLETE) Liqd  Take 237 mLs by mouth 2 (two) times daily between meals.     feeding supplement (PRO-STAT SUGAR FREE 64) Liqd  Take 30 mLs by mouth 3 (three) times daily with meals.     hydrOXYzine 10 MG tablet  Commonly known as:  ATARAX/VISTARIL  Take 10 mg by mouth 2 (two) times daily as needed for anxiety. For Anxiety     ketoconazole 2 % cream  Commonly known as:  NIZORAL  Apply 1 application topically daily. For rash     lansoprazole 30 MG capsule  Commonly known as:  PREVACID  Take 30 mg by mouth 2 (two) times daily.     levothyroxine 75 MCG tablet  Commonly known as:  SYNTHROID, LEVOTHROID  Take 1 tablet (75 mcg total) by mouth daily before breakfast.     LUMIGAN 0.01 % Soln  Generic drug:  bimatoprost  Place 1 drop into both eyes at bedtime. For glaucoma     multivitamin tablet  Take 1 tablet by mouth daily.     PARoxetine 20 MG tablet  Commonly known as:  PAXIL  Take 20 mg by mouth every morning. For depression     polyethylene glycol packet  Commonly known as:  MIRALAX / GLYCOLAX  Take 17 g by mouth daily.     sennosides-docusate sodium 8.6-50 MG tablet  Commonly known as:  SENOKOT-S  Take 2 tablets by mouth at bedtime. For constipation     traZODone 50 MG tablet  Commonly known as:  DESYREL  Take 0.5 tablets (25 mg total) by mouth at bedtime as needed (insomnia).     vitamin C 500 MG tablet  Commonly known as:  ASCORBIC ACID  Take 500 mg by mouth daily.       I do note for constipation she is on Senokot-S 2 tablets twice a day-lactulose 30 mL a day Dulcolax suppository 10 mg 2 times a week Fiber-Lax 2 tablets twice a day  No orders of the defined types were placed in this encounter.     Immunization History  Administered Date(s) Administered  . Influenza Whole 10/16/2012  . Pneumococcal Conjugate-13 09/08/2007  . Pneumococcal-Unspecified 02/05/2011  . Tdap 09/08/2007  . Zoster 09/08/2007    Social History  Substance Use Topics  . Smoking status: Never Smoker   .  Smokeless tobacco: Not on file  . Alcohol Use: No    Review of Systems  DATA OBTAINED: from nurse, medical record GENERAL:  no fevers, eating and drinking pretty well,  SKIN: No itching, rash violaceous bruising of left great toe as noted above HEENT: No complaint RESPIRATORY: No cough, wheezing, SOB CARDIAC: No chest pain, palpitations, lower extremity edema  GI: No abdominal pain, No N/V/D --Does have constipation---, No heartburn or reflux  GU: No dysuria, frequency or urgency, or incontinence  MUSCULOSKELETAL: No unrelieved bone/joint pain NEUROLOGIC: No headache, dizziness  PSYCHIATRIC: No overt anxiety or sadness   Filed Vitals:   09/02/14 1334  BP: 110/62  Pulse: 68  Temp: 97.9 F (36.6 C)  Resp: 18    Physical Exam  GENERAL APPEARANCE: Alert, min conversant, No acute distress, lying in bed smiling  SKIN: No diaphoresis has violaceous bruising of her left great toe this does not appear to be tende HEENT: Unremarkable oropharynx is clear mucous membranes appear somewhat moist RESPIRATORY: Breathing is even, unlabored. Lung sounds are clear is poor respiratory effort she does not really follow verbal commands  CARDIOVASCULAR: Heart RRR no murmurs, rubs or gallops. No peripheral edema  GASTROINTESTINAL: Abdomen is soft, non-tender, not distended w/ normal bowel sounds.  GENITOURINARY: Bladder non tender, not distended  MUSCULOSKELETAL: contractures LE--does have violaceous bruising of her left great toe there is no tenderness to palpation nor acute deformity noted or edema capillary refill intact--other than arthritic changes in muscle wasting I do not note any acute deformities lower  or upper extremities she has some mild bruising upper extremities which appears to be fairly baseline NEUROLOGIC: Cranial nerves 2-12 grossly intact. PSYCHIATRIC: dementia , no behavioral issues not following verbal commands day  Patient Active Problem List   Diagnosis Date Noted  . Hyperglycemia 05/04/2014  . Rash and nonspecific skin eruption 04/12/2014  . Acute renal failure superimposed on stage 3 chronic kidney disease 01/03/2014  . Glaucoma   . Dyspnea 12/30/2013  . Pain, generalized 12/30/2013  . DNR (do not resuscitate) 12/30/2013  . Palliative care encounter 12/30/2013  . AKI (acute kidney injury) 12/29/2013  . Acute encephalopathy 12/29/2013  . Acute renal failure syndrome   . Encephalopathy acute   . CKD (chronic kidney disease) stage 3, GFR 30-59 ml/min 11/13/2013  . Cellulitis of arm, left 11/13/2013  . Protein-calorie malnutrition, severe 11/02/2013  . Metabolic encephalopathy 24/09/7351  . 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 Alzheimer's disease 08/29/2013  . Thyroid activity decreased 08/29/2013  . Arthritis, senescent 08/29/2013  . Esophageal reflux 08/29/2013  . Chronic constipation 08/29/2013  . Bipolar 1 disorder, depressed 01/13/2013  . ARF (acute renal failure) 01/13/2013  . Moderate protein-calorie malnutrition 01/09/2013  . Normocytic anemia 01/09/2013  . Sacral decubitus ulcer, stage II 01/09/2013  . Hypernatremia 01/07/2013  . Dehydration 01/04/2013  . Hypokalemia 01/04/2013  . Post-surgical hypothyroidism 01/04/2013  . Dementia without behavioral disturbance 12/08/2012  . Unspecified vitamin D deficiency 07/21/2012  . Anemia in chronic kidney disease 06/19/2012  . Dyslipidemia 06/03/2012  . Osteoarthritis 06/03/2012  . Manic depressive disorder 04/11/2012  . Unspecified constipation 04/11/2012  . GERD (gastroesophageal reflux disease) 04/11/2012  . Osteoporosis, unspecified 04/11/2012  . Diabetes  insipidus -  lithium-induced 09/07/2011  . History of iron deficiency anemia 09/07/2011  . Acute blood loss anemia 09/06/2011  . Essential hypertension, benign 02/01/2011  . Depressive disorder, not elsewhere classified 02/01/2011  . Hypothyroidism 02/01/2011    Labs.  04/22/2014.  Sodium 136 potassium 4.7 BUN 21 creatinine 0.75 glucose 293.  Albumin 3.22 otherwise liver function tests within normal limits.  Hemoglobin 9.3 platelets 2:30 WBC 4.6  CBC    Component Value Date/Time   WBC 4.6 04/22/2014   WBC 7.4 12/29/2013 0410   RBC 4.81 12/29/2013 0410   RBC 4.43 09/06/2011 1955   HGB 9.3* 04/22/2014   HCT 30* 04/22/2014   PLT 230 04/22/2014   MCV 111.2* 12/29/2013 0410   LYMPHSABS 1.7 12/28/2013 2023   MONOABS 0.6 12/28/2013 2023   EOSABS 0.1 12/28/2013 2023   BASOSABS 0.0 12/28/2013 2023    CMP     Component Value Date/Time   NA 136* 04/22/2014   NA 140 01/04/2014 0532   K 4.7 04/22/2014   CL 113* 01/04/2014 0532   CO2 23 01/04/2014 0532   GLUCOSE 94 01/04/2014 0532   BUN 21 04/22/2014   BUN 16 01/04/2014 0532   CREATININE 0.8 04/22/2014   CREATININE 0.95 01/04/2014 0532   CALCIUM 8.3* 01/04/2014 0532   CALCIUM 9.7 05/11/2008 0533   PROT 5.9* 01/02/2014 0507   ALBUMIN 2.9* 01/02/2014 0507   AST 20 04/22/2014   ALT 5* 04/22/2014   ALKPHOS 52 04/22/2014   BILITOT 2.2* 01/02/2014 0507   GFRNONAA 56* 01/04/2014 0532   GFRAA 65* 01/04/2014 0532    Assessment and Plan    #1-history of end-stage dementia-she is under hospice services   -she appears to be doing fairly well with supportive care--again conservative follow-up is desired-.  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-at this point appears to be stable  apparently eating and drinking better than before her hospitalization-       #4 anemia-again previous readings may be due to hemo- concentration-at this point will monitor again secondary to patient being under hospice care clinically stable with minimally invasive procedures desired  #5-left great toe bruising I do not see anything acute here at this point monitor this area is not painful swollen  #6-as noted above she does have constipation significantly is on numerous agents at this point monitor apparently she had a big bowel movement today physical exam later was fairly benign    Rodnesha Elie C, PA-C

## 2014-12-06 IMAGING — CR DG CHEST 1V PORT
1 series · 2 of 2 positions shown · non-contrast
Comparison: 05/07/2013

CLINICAL DATA: Altered mental status

EXAM:
PORTABLE CHEST - 1 VIEW

[Series 1: AP · U · 2 of 2 slices shown]
[im 1/2]
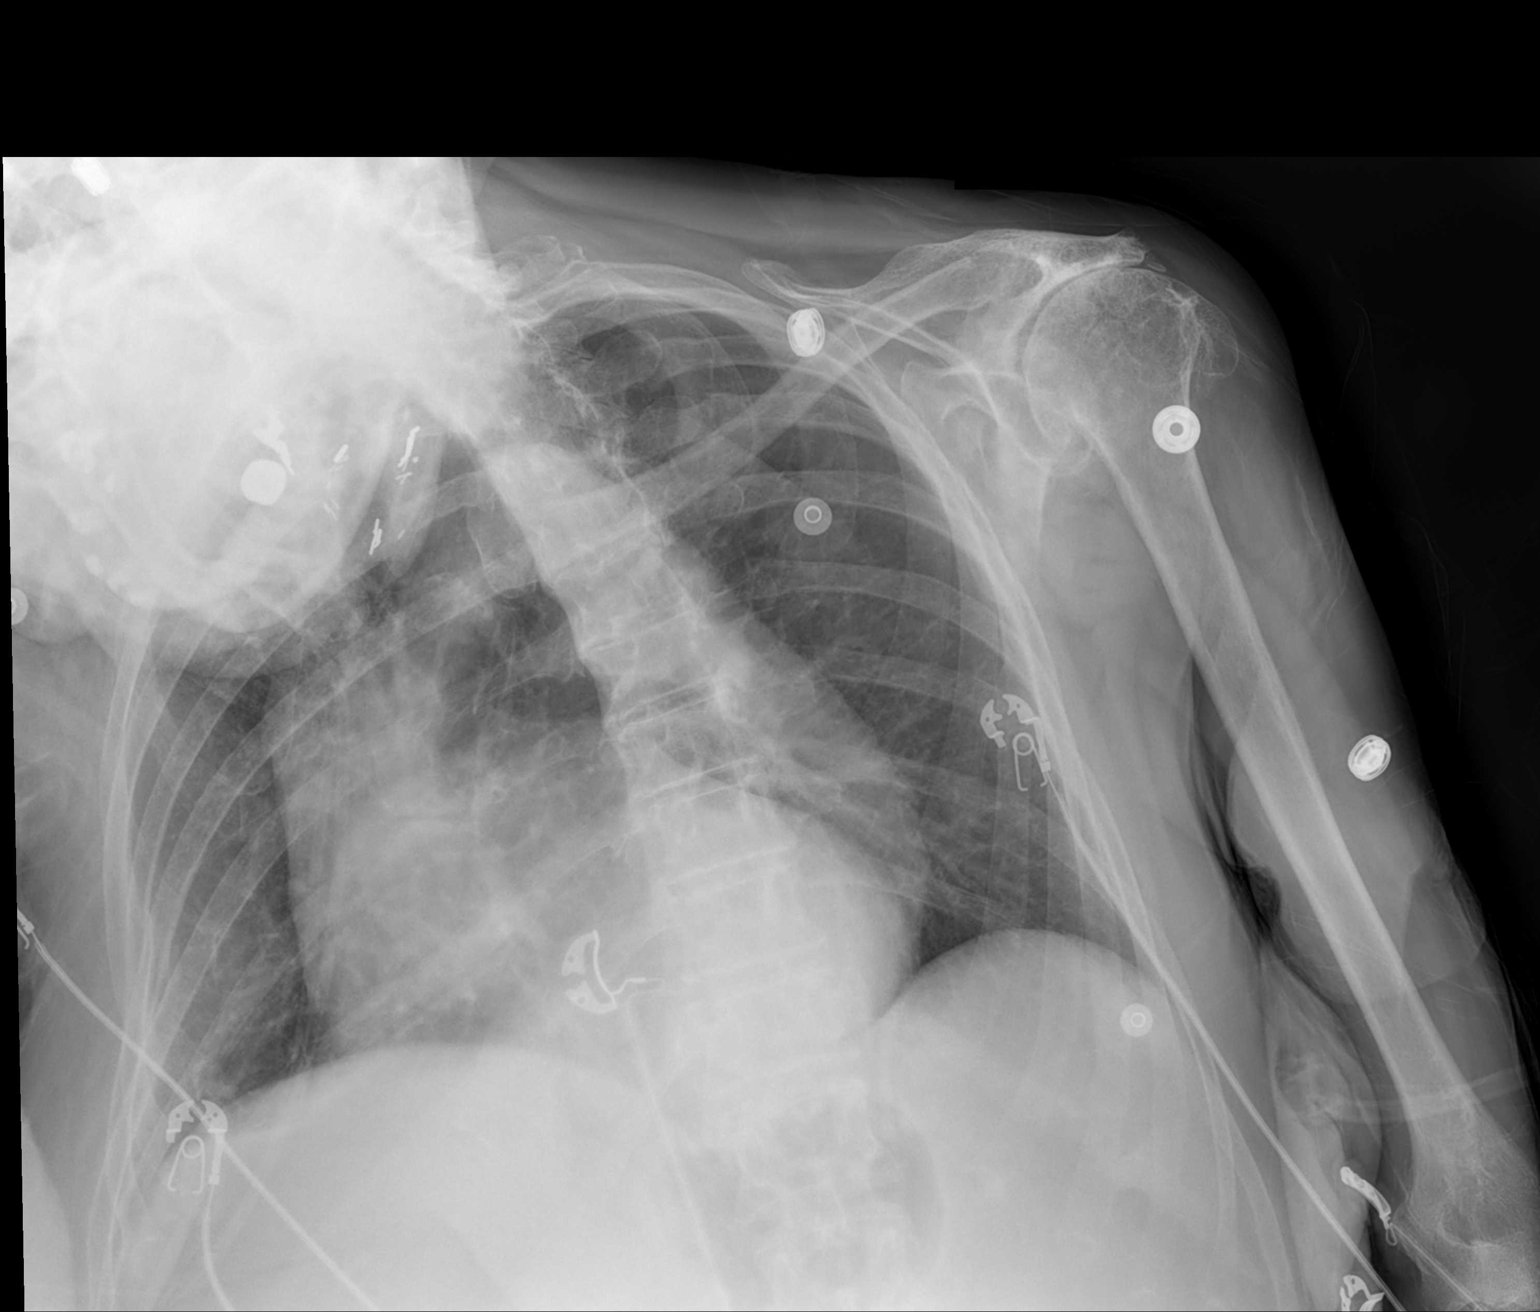
[im 2/2]
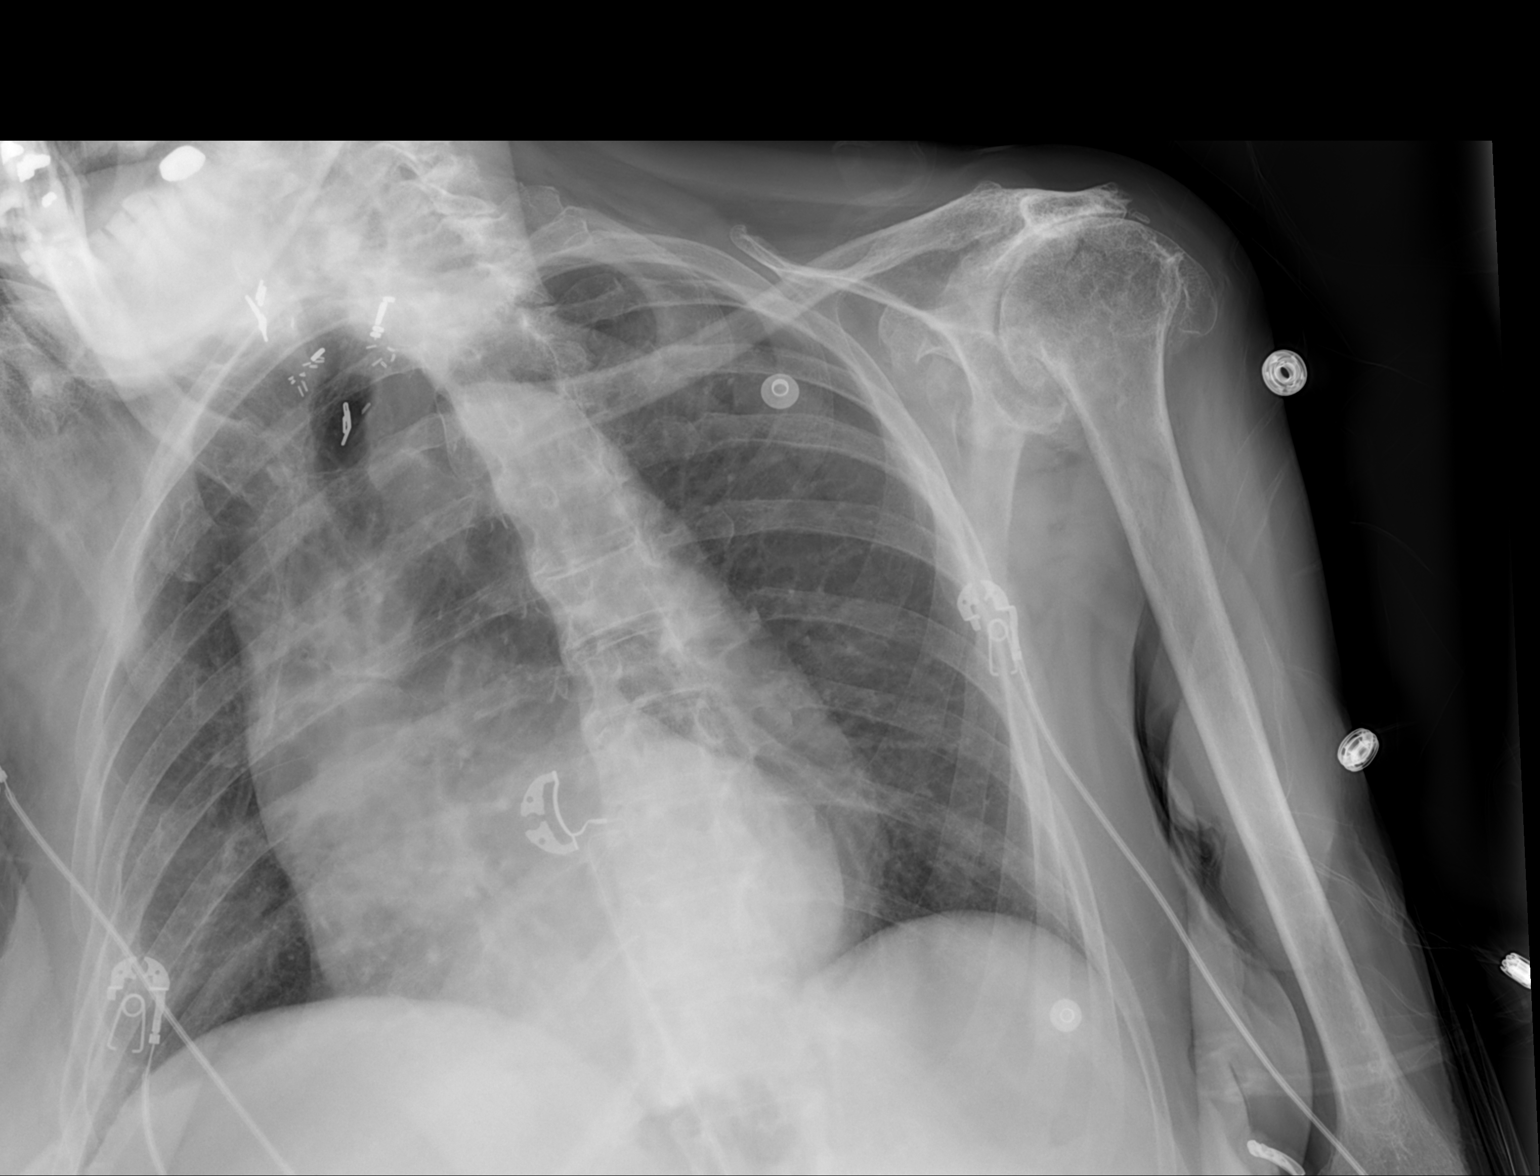

[2 of 2 positions shown; findings below may reference images not displayed]

FINDINGS: Patient is mildly rotated.  Chin obscures the right lung apex.

Lungs are essentially clear.  No pleural effusion or pneumothorax.

The heart is top-normal in size.

Degenerative changes of the thoracic spine and left shoulder.
IMPRESSION: No evidence of acute cardiopulmonary disease.

## 2014-12-09 IMAGING — CR DG ABD PORTABLE 1V
1 series · 2 of 2 positions shown · non-contrast
Comparison: CT scan of the abdomen dated 09/13/2011 and abdominal
radiograph dated 05/13/2008

CLINICAL DATA: Abdominal distention.

EXAM:
PORTABLE ABDOMEN - 1 VIEW

[Series 1: ap (kub) · U · 2 of 2 slices shown]
[im 1/2]
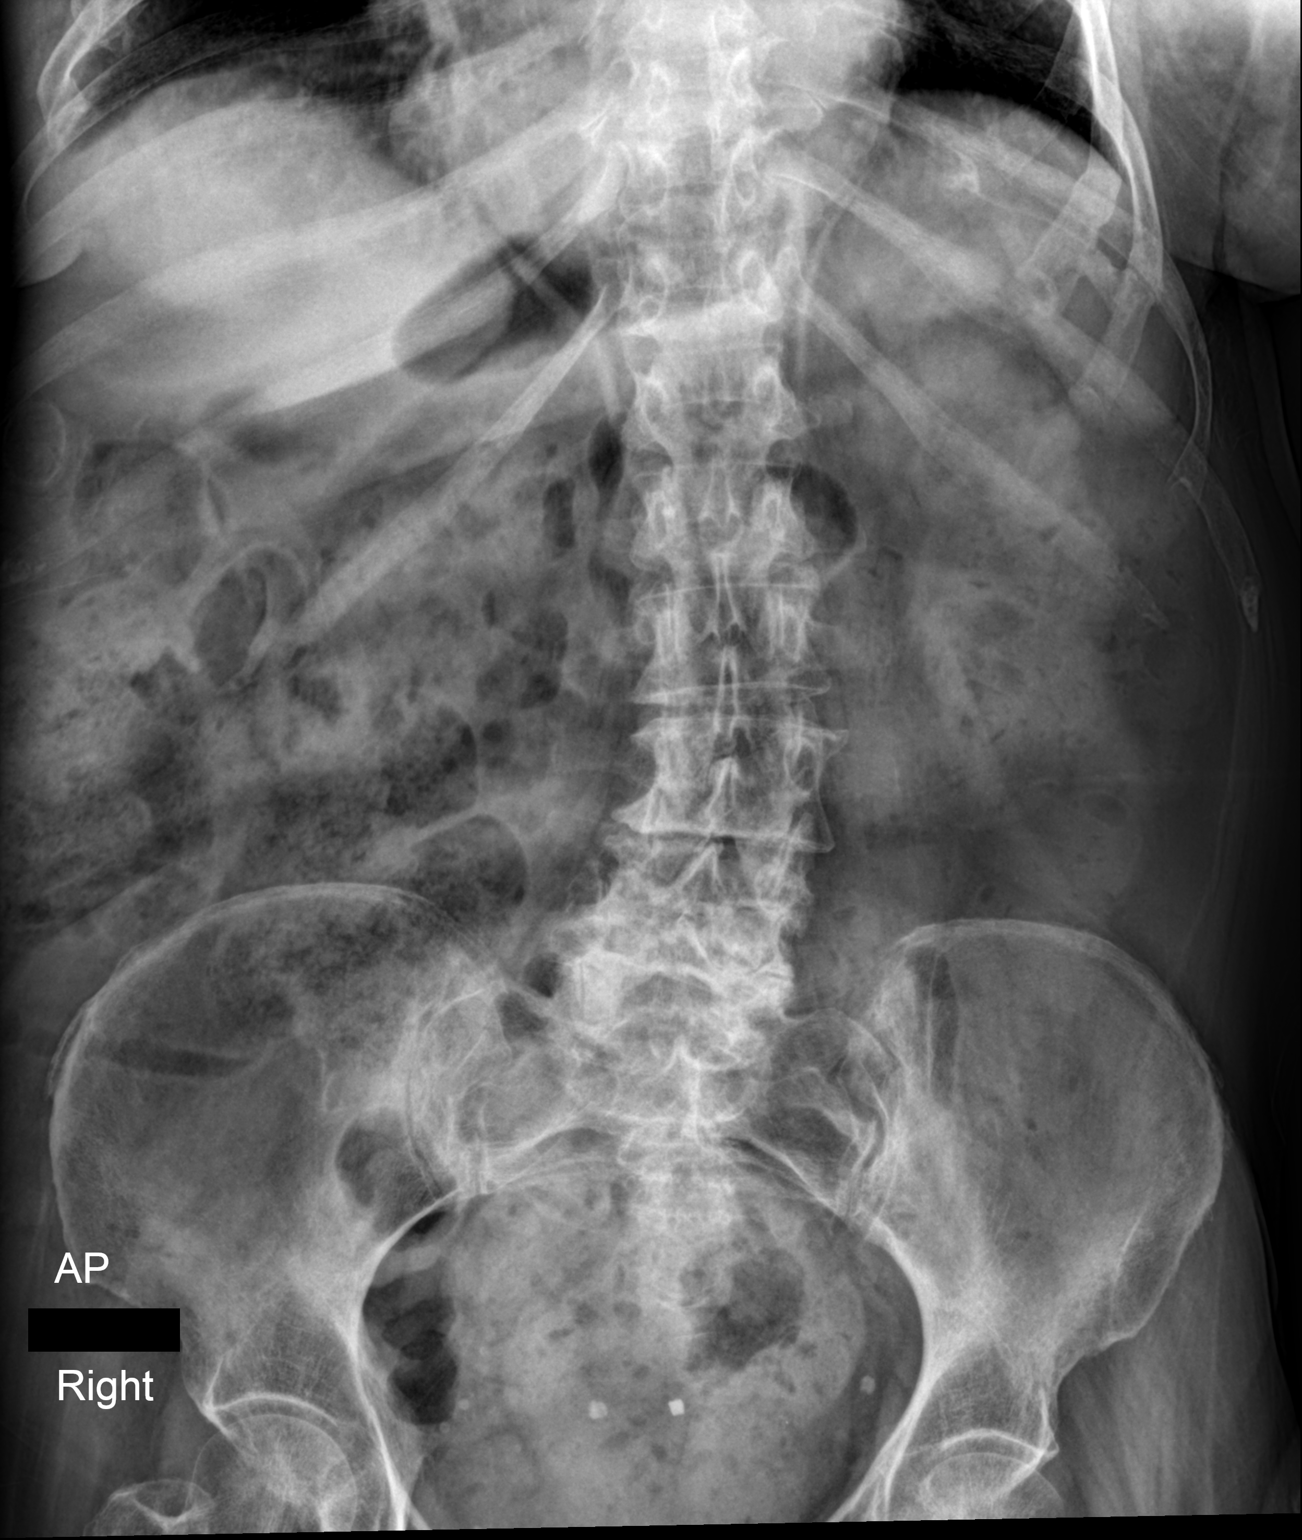
[im 2/2]
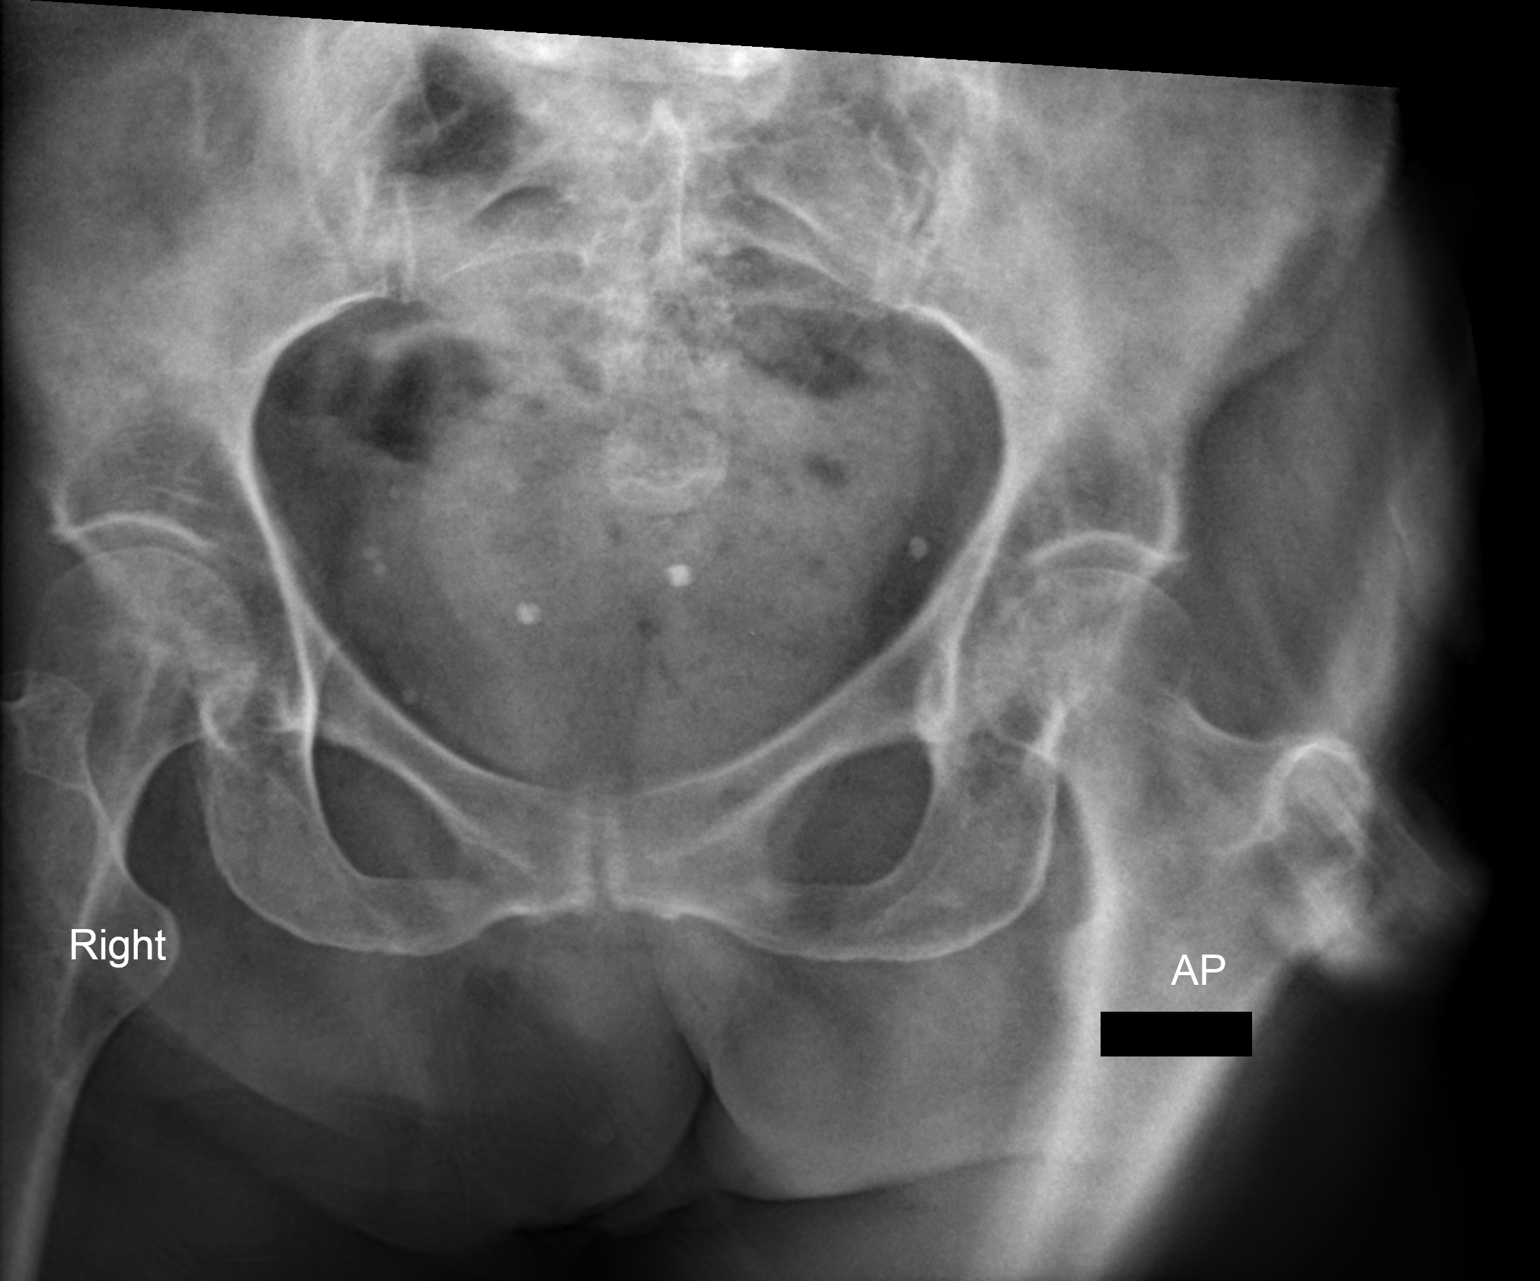

[2 of 2 positions shown; findings below may reference images not displayed]

FINDINGS: There is extensive stool throughout the colon with a large amount of
stool in the rectum consistent with fecal impaction. There are no
dilated loops of large or small bowel. No visible free air or free
fluid. Lumbar scoliosis with degenerative disc and joint disease in
the lower lumbar spine.
IMPRESSION: Constipation with fecal impaction.

## 2014-12-23 ENCOUNTER — Non-Acute Institutional Stay (SKILLED_NURSING_FACILITY): Payer: Medicare Other | Admitting: Internal Medicine

## 2014-12-23 DIAGNOSIS — K5900 Constipation, unspecified: Secondary | ICD-10-CM | POA: Diagnosis not present

## 2014-12-23 DIAGNOSIS — D631 Anemia in chronic kidney disease: Secondary | ICD-10-CM | POA: Diagnosis not present

## 2014-12-23 DIAGNOSIS — N189 Chronic kidney disease, unspecified: Secondary | ICD-10-CM

## 2014-12-23 DIAGNOSIS — E890 Postprocedural hypothyroidism: Secondary | ICD-10-CM | POA: Diagnosis not present

## 2014-12-23 DIAGNOSIS — K5909 Other constipation: Secondary | ICD-10-CM

## 2014-12-23 DIAGNOSIS — F0390 Unspecified dementia without behavioral disturbance: Secondary | ICD-10-CM | POA: Diagnosis not present

## 2014-12-23 NOTE — Progress Notes (Signed)
Patient ID: [name], female   DOB: December 02, 1934, 80 y.o.   MRN: [id number]    MRN: [id number] Name: [name]  Sex: female Age: 80 y.o. DOB: Dec 01, 1934  [place] #: [facility] Level Of Care: SNF Provider: Wille Celeste Emergency Contacts: Extended Emergency Contact Information Primary Emergency Contact: [name] Address: [address], [place] Phone: [phone] Relation: Daughter  Code Status: DNR  Allergies: Lithium; Penicillins; and Sulfa antibiotics  Chief Complaint  Patient presents with  . Medical Management of Chronic Issues   including dementia anemia constipation  HPI: Patient is 80 y.o. female who has endstage dementia on Hospice-a   She does have a history of chronic constipation she is on numerous agents lactulose Dulcolax suppository Senokot-S and apparently this has been fairly affective    She was hospitalized last year for for hypernatremia from poor by mouth intake and end-stage dementia-and she was discharged under hospice services Her sodium on lab done on April 14 shows improvement at 136 this is stable  did incidentally show an elevated glucose of 293 per chart review it appears she does have a history of diabetes type 2 versus diabetes insipidus-I do not see blood sugars again medical management is quite conservative secondary to hospice care conservative therapy and wishes  I also note hemoglobin was 9.3 this appears to be a bit lower than her baseline back in December was 14.4 back in November was 12.4-again this could be somewhat related to possible hemo-concentration secondary to poor oral intake previously--and again patient is under essentially comfort care hospice services with no labs desired Again she is doing better in this regards with her by mouth intake with strong family support. She is up in her chair today she is bright does not really respond to verbal commands appears to be  comfortable-I have not heard of any recent issues nursing staff does not report any      .  --  Past Medical History  Diagnosis Date  . Thyroid cancer   . Ulcer     chronic  . Depression   . Vitamin D deficiency   . Anxiety   . Malaise and fatigue   . Osteoarthritis   . Kidney disease   . Pneumonia   . Dementia   . Alzheimer disease   . Hypothyroidism   . Bipolar 1 disorder   . Dehydration   . Hypertension   . Diabetes mellitus   . Pressure ulcer stage III   . Constipation   . Open-angle glaucoma   . Anemia   . Peripheral neuropathy   . Hypertensive heart disease   . CKD (chronic kidney disease), stage III   . Osteoarthrosis   . Mixed hyperlipidemia     Past Surgical History  Procedure Laterality Date  . Esophagogastroduodenoscopy  02/23/2011    Procedure: ESOPHAGOGASTRODUODENOSCOPY (EGD);  Surgeon: Beryle Beams, MD;  Location: [facility] ENDOSCOPY;  Service: Endoscopy;  Laterality: N/A;  . Abdominal hysterectomy    . Foot surgery    . Retinal detachment surgery    . Thyroidectomy        Medication List       This list is accurate as of: 12/23/14 11:59 PM.  Always use your most recent med list.               acetaminophen 325 MG tablet  Commonly known as:  TYLENOL  Take 2 tablets (  650 mg total) by mouth every 6 (six) hours as needed for mild pain (or Fever >/= 101).     antiseptic oral rinse 0.05 % Liqd solution  Commonly known as:  CPC / CETYLPYRIDINIUM CHLORIDE 0.05%  7 mLs by Mouth Rinse route 2 times daily at 12 noon and 4 pm.     bisacodyl 10 MG suppository  Commonly known as:  DULCOLAX  Place 1 suppository (10 mg total) rectally daily as needed for moderate constipation.     calcium-vitamin D 500-200 MG-UNIT tablet  Commonly known as:  OSCAL WITH D  Take 2 tablets by mouth daily.     chlorhexidine 0.12 % solution  Commonly known as:  PERIDEX  15 mLs by Mouth Rinse route 2 (two) times daily.     cholecalciferol 1000 UNITS tablet  Commonly  known as:  VITAMIN D  Take 1,000 Units by mouth daily.     diclofenac sodium 1 % Gel  Commonly known as:  VOLTAREN  Apply 4 g topically 4 (four) times daily. Apply to left shoulder and left knee     feeding supplement (ENSURE COMPLETE) Liqd  Take 237 mLs by mouth 2 (two) times daily between meals.     feeding supplement (PRO-STAT SUGAR FREE 64) Liqd  Take 30 mLs by mouth 3 (three) times daily with meals.     hydrOXYzine 10 MG tablet  Commonly known as:  ATARAX/VISTARIL  Take 10 mg by mouth 2 (two) times daily as needed for anxiety. For Anxiety     ketoconazole 2 % cream  Commonly known as:  NIZORAL  Apply 1 application topically daily. For rash     lactulose (encephalopathy) 10 GM/15ML Soln  Commonly known as:  CHRONULAC  Take 30 g by mouth.     lansoprazole 30 MG capsule  Commonly known as:  PREVACID  Take 30 mg by mouth 2 (two) times daily.     levothyroxine 75 MCG tablet  Commonly known as:  SYNTHROID, LEVOTHROID  Take 1 tablet (75 mcg total) by mouth daily before breakfast.     LUMIGAN 0.01 % Soln  Generic drug:  bimatoprost  Place 1 drop into both eyes at bedtime. For glaucoma     multivitamin tablet  Take 1 tablet by mouth daily.     PARoxetine 20 MG tablet  Commonly known as:  PAXIL  Take 20 mg by mouth every morning. For depression     polyethylene glycol packet  Commonly known as:  MIRALAX / GLYCOLAX  Take 17 g by mouth daily.     sennosides-docusate sodium 8.6-50 MG tablet  Commonly known as:  SENOKOT-S  Take 2 tablets by mouth at bedtime. For constipation     vitamin C 500 MG tablet  Commonly known as:  ASCORBIC ACID  Take 500 mg by mouth daily.       I do note for constipation she is on Senokot-S 2 tablets twice a day-lactulose 30 mL a day Dulcolax suppository 10 mg 2 times a week Fiber-Lax 2 tablets twice a day  Meds ordered this encounter  Medications  . lactulose, encephalopathy, (CHRONULAC) 10 GM/15ML SOLN    Sig: Take 30 g by mouth.     Immunization History  Administered Date(s) Administered  . Influenza Whole 10/16/2012  . Pneumococcal Conjugate-13 09/08/2007  . Pneumococcal-Unspecified 02/05/2011  . Tdap 09/08/2007  . Zoster 09/08/2007    Social History  Substance Use Topics  . Smoking status: Never Smoker   . Smokeless tobacco: Not  on file  . Alcohol Use: No    Review of Systems  DATA OBTAINED: from nurse, medical record GENERAL:  no fevers, eating and drinking fairly well,  SKIN: No itching, rash  HEENT: No complaint RESPIRATORY: No cough, wheezing, SOB CARDIAC: No chest pain, palpitations, lower extremity edema  GI: No abdominal pain, No N/V/D --Does have  History of constipation---, No heartburn or reflux  GU: No dysuria, frequency or urgency, or incontinence  MUSCULOSKELETAL: No unrelieved bone/joint pain NEUROLOGIC: No headache, dizziness  PSYCHIATRIC: No overt anxiety or sadness   Filed Vitals:   12/23/14 1940  Pulse: 80  Resp: 17   blood pressure and temperature pending per nursing these have been stable and  have been taking judiciously secondary to emphasis on comfort care with minimally invasive procedures  Physical Exam  GENERAL APPEARANCE: Alert, min conversant, No acute distress, sitting up in her chair smiling  SKIN: No diaphoresis  Or rash HEENT: Unremarkable oropharynx is clear mucous membranes appear somewhat moist RESPIRATORY: Breathing is even, unlabored. Lung sounds are clear is poor respiratory effort she does not really follow verbal commands  CARDIOVASCULAR: Heart RRR no murmurs, rubs or gallops. No peripheral edema  GASTROINTESTINAL: Abdomen is soft, non-tender, not distended w/ normal bowel sounds.  GENITOURINARY: Bladder non tender, not distended  MUSCULOSKELETAL: contractures LE-- --other than arthritic changes and muscle wasting I do not note any acute deformities lower or upper extremities  NEUROLOGIC: Cranial nerves 2-12 grossly intact. PSYCHIATRIC: dementia ,  no behavioral issues not following verbal commands day  Patient Active Problem List   Diagnosis Date Noted  . Hyperglycemia 05/04/2014  . Rash and nonspecific skin eruption 04/12/2014  . Acute renal failure superimposed on stage 3 chronic kidney disease ([place]) 01/03/2014  . Glaucoma   . Dyspnea 12/30/2013  . Pain, generalized 12/30/2013  . DNR (do not resuscitate) 12/30/2013  . Palliative care encounter 12/30/2013  . AKI (acute kidney injury) ([place]) 12/29/2013  . Acute encephalopathy 12/29/2013  . Acute renal failure syndrome ([place])   . Encephalopathy acute   . CKD (chronic kidney disease) stage 3, GFR 30-59 ml/min 11/13/2013  . Cellulitis of arm, left 11/13/2013  . Protein-calorie malnutrition, severe ([place]) 11/02/2013  . Metabolic encephalopathy XX123456  . 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 Alzheimer's disease 08/29/2013  . Thyroid activity decreased 08/29/2013  . Arthritis, senescent 08/29/2013  . Esophageal reflux 08/29/2013  . Chronic constipation 08/29/2013  . Bipolar 1 disorder, depressed ([place]) 01/13/2013  . ARF (acute renal failure) ([place]) 01/13/2013  . Moderate protein-calorie malnutrition ([place]) 01/09/2013  . Normocytic anemia 01/09/2013  . Sacral decubitus ulcer, stage II 01/09/2013  . Hypernatremia 01/07/2013  . Dehydration 01/04/2013  . Hypokalemia 01/04/2013  . Post-surgical hypothyroidism 01/04/2013  . Dementia without behavioral disturbance 12/08/2012  . Unspecified vitamin D deficiency 07/21/2012  . Anemia in chronic kidney disease 06/19/2012  . Dyslipidemia 06/03/2012  . Osteoarthritis 06/03/2012  . Manic depressive disorder ([place]) 04/11/2012  . Unspecified constipation 04/11/2012  . GERD (gastroesophageal reflux disease) 04/11/2012  . Osteoporosis, unspecified 04/11/2012  . Diabetes insipidus -  lithium-induced 09/07/2011  . History of iron deficiency anemia 09/07/2011  . Acute blood loss anemia 09/06/2011   . Essential hypertension, benign 02/01/2011  . Depressive disorder, not elsewhere classified 02/01/2011  . Hypothyroidism 02/01/2011    Labs.  04/22/2014.  Sodium 136 potassium 4.7 BUN 21 creatinine 0.75 glucose 293.  Albumin 3.22 otherwise liver function tests within normal limits.  Hemoglobin 9.3  platelets 2:30 WBC 4.6  CBC    Component Value Date/Time   WBC 4.6 04/22/2014   WBC 7.4 12/29/2013 0410   RBC 4.81 12/29/2013 0410   RBC 4.43 09/06/2011 1955   HGB 9.3* 04/22/2014   HCT 30* 04/22/2014   PLT 230 04/22/2014   MCV 111.2* 12/29/2013 0410   LYMPHSABS 1.7 12/28/2013 2023   MONOABS 0.6 12/28/2013 2023   EOSABS 0.1 12/28/2013 2023   BASOSABS 0.0 12/28/2013 2023    CMP     Component Value Date/Time   NA 136* 04/22/2014   NA 140 01/04/2014 0532   K 4.7 04/22/2014   CL 113* 01/04/2014 0532   CO2 23 01/04/2014 0532   GLUCOSE 94 01/04/2014 0532   BUN 21 04/22/2014   BUN 16 01/04/2014 0532   CREATININE 0.8 04/22/2014   CREATININE 0.95 01/04/2014 0532   CALCIUM 8.3* 01/04/2014 0532   CALCIUM 9.7 05/11/2008 0533   PROT 5.9* 01/02/2014 0507   ALBUMIN 2.9* 01/02/2014 0507   AST 20 04/22/2014   ALT 5* 04/22/2014   ALKPHOS 52 04/22/2014   BILITOT 2.2* 01/02/2014 0507   GFRNONAA 56* 01/04/2014 0532   GFRAA 65* 01/04/2014 0532    Assessment and Plan    #1-history of end-stage dementia-she is under hospice services   -she appears to be doing fairly well with supportive care--again conservative follow-up is desired-. She actually appears to be quite stable  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-at this point appears to be stable apparently  Continues to eat and drink better than before her  hospitalization-       #4 anemia-again previous readings may be due to hemo- concentration-at this point will monitor again secondary to patient being under hospice care clinically stable with minimally invasive procedures desired  #   #5-as noted above she does have constipation significantly is on numerous agents at this point monitor a--apparently this has not been an issue recently  [id number]    Aiman Noe C, PA-C

## 2015-01-24 ENCOUNTER — Non-Acute Institutional Stay (SKILLED_NURSING_FACILITY): Payer: Medicare Other | Admitting: Internal Medicine

## 2015-01-24 DIAGNOSIS — F0390 Unspecified dementia without behavioral disturbance: Secondary | ICD-10-CM

## 2015-01-24 DIAGNOSIS — R627 Adult failure to thrive: Secondary | ICD-10-CM | POA: Diagnosis not present

## 2015-01-24 NOTE — Progress Notes (Signed)
Patient ID: [name], female   DOB: October 24, 1934, 81 y.o.   MRN: [id number]     MRN: [id number] Name: [name]  Sex: female Age: 81 y.o. DOB: 02-24-1934  [place] [facility]: [facility] Level Of Care: SNF Provider: Wille Celeste Emergency Contacts: Extended Emergency Contact Information Primary Emergency Contact: Buchanan,[name] Address: [address], [place] Phone: [phone] Relation: Daughter  Code Status: DNR  Allergies: Lithium; Penicillins; and Sulfa antibiotics  Chief Complaint  Patient presents with  . Acute Visit   secondary to failure to thrive-end-stage dementia--poor po intake  HPI: Patient is 81 y.o. female who has endstage dementia on Hospice-services Patient actually has been fairly stable for an extended period of time after initially having significant failure to thrive issues with hypernatremia which required hospitalization--iwas discharged from the hospital on hospice services.  r it appears patient now is gradually declining especially within the last few days-nursing did ask me to take a look at her today.  Her appetite apparently is quite poor with scant by mouth intake  Her blood pressure was difficult to obtain appears her systolic is around 0000000 sugar is stable at 163 O2 saturation difficult to obtain she does have quite cold fingers.  She does not appear to be in any distress and is responsive but appears increasingly weak and frail on exam today-apparently she is not been eating and drinking much the last few days ei.  Family  wishes conservative comfort care per discussion with hospice nurse who will see her tomorrow.  She does not appear to be in any distress just very weak.      She does have a history of chronic constipation she is on numerous agents lactulose Dulcolax suppository Senokot-S           .  --  Past Medical History  Diagnosis Date  . Thyroid cancer   . Ulcer      chronic  . Depression   . Vitamin D deficiency   . Anxiety   . Malaise and fatigue   . Osteoarthritis   . Kidney disease   . Pneumonia   . Dementia   . Alzheimer disease   . Hypothyroidism   . Bipolar 1 disorder   . Dehydration   . Hypertension   . Diabetes mellitus   . Pressure ulcer stage III   . Constipation   . Open-angle glaucoma   . Anemia   . Peripheral neuropathy   . Hypertensive heart disease   . CKD (chronic kidney disease), stage III   . Osteoarthrosis   . Mixed hyperlipidemia     Past Surgical History  Procedure Laterality Date  . Esophagogastroduodenoscopy  02/23/2011    Procedure: ESOPHAGOGASTRODUODENOSCOPY (EGD);  Surgeon: Beryle Beams, MD;  Location: [facility] ENDOSCOPY;  Service: Endoscopy;  Laterality: N/A;  . Abdominal hysterectomy    . Foot surgery    . Retinal detachment surgery    . Thyroidectomy        Medication List       This list is accurate as of: 01/24/15  4:50 PM.  Always use your most recent med list.               acetaminophen 325 MG tablet  Commonly known as:  TYLENOL  Take 2 tablets (650 mg total) by mouth every 6 (six) hours as needed for mild pain (or Fever >/= 101).  antiseptic oral rinse 0.05 % Liqd solution  Commonly known as:  CPC / CETYLPYRIDINIUM CHLORIDE 0.05%  7 mLs by Mouth Rinse route 2 times daily at 12 noon and 4 pm.     bisacodyl 10 MG suppository  Commonly known as:  DULCOLAX  Place 1 suppository (10 mg total) rectally daily as needed for moderate constipation.     calcium-vitamin D 500-200 MG-UNIT tablet  Commonly known as:  OSCAL WITH D  Take 2 tablets by mouth daily.     chlorhexidine 0.12 % solution  Commonly known as:  PERIDEX  15 mLs by Mouth Rinse route 2 (two) times daily.     cholecalciferol 1000 units tablet  Commonly known as:  VITAMIN D  Take 1,000 Units by mouth daily.     diclofenac sodium 1 % Gel  Commonly known as:  VOLTAREN  Apply 4 g topically 4 (four) times daily. Apply to left  shoulder and left knee     feeding supplement (ENSURE COMPLETE) Liqd  Take 237 mLs by mouth 2 (two) times daily between meals.     feeding supplement (PRO-STAT SUGAR FREE 64) Liqd  Take 30 mLs by mouth 3 (three) times daily with meals.     hydrOXYzine 10 MG tablet  Commonly known as:  ATARAX/VISTARIL  Take 10 mg by mouth 2 (two) times daily as needed for anxiety. For Anxiety     ketoconazole 2 % cream  Commonly known as:  NIZORAL  Apply 1 application topically daily. For rash     lactulose (encephalopathy) 10 GM/15ML Soln  Commonly known as:  CHRONULAC  Take 30 g by mouth.     lansoprazole 30 MG capsule  Commonly known as:  PREVACID  Take 30 mg by mouth 2 (two) times daily.     levothyroxine 75 MCG tablet  Commonly known as:  SYNTHROID, LEVOTHROID  Take 1 tablet (75 mcg total) by mouth daily before breakfast.     LUMIGAN 0.01 % Soln  Generic drug:  bimatoprost  Place 1 drop into both eyes at bedtime. For glaucoma     multivitamin tablet  Take 1 tablet by mouth daily.     PARoxetine 20 MG tablet  Commonly known as:  PAXIL  Take 20 mg by mouth every morning. For depression     polyethylene glycol packet  Commonly known as:  MIRALAX / GLYCOLAX  Take 17 g by mouth daily.     sennosides-docusate sodium 8.6-50 MG tablet  Commonly known as:  SENOKOT-S  Take 2 tablets by mouth at bedtime. For constipation     vitamin C 500 MG tablet  Commonly known as:  ASCORBIC ACID  Take 500 mg by mouth daily.       I do note for constipation she is on Senokot-S 2 tablets twice a day-lactulose 30 mL a day Dulcolax suppository 10 mg 2 times a week Fiber-Lax 2 tablets twice a day  No orders of the defined types were placed in this encounter.    Immunization History  Administered Date(s) Administered  . Influenza Whole 10/16/2012  . Pneumococcal Conjugate-13 09/08/2007  . Pneumococcal-Unspecified 02/05/2011  . Tdap 09/08/2007  . Zoster 09/08/2007    Social History  Substance  Use Topics  . Smoking status: Never Smoker   . Smokeless tobacco: Not on file  . Alcohol Use: No    Review of Systems  DATA OBTAINED: from nurse, medical record GENERAL:  no fevers, eating and drinking poorly,  SKIN: No itching, rash  HEENT: No complaint RESPIRATORY: No cough, wheezing, SOB CARDIAC: No chest pain, palpitations, lower extremity edema  GI: No abdominal pain, No N/V/D --Does have  History of constipation---, No heartburn or reflux  GU: No dysuria, frequency or urgency, or incontinence  MUSCULOSKELETAL: No unrelieved bone/joint pain--but extreme weakness NEUROLOGIC: No headache, dizziness  PSYCHIATRIC: No overt anxiety or sadness   Filed Vitals:   01/24/15 1643  Pulse: 70  Resp: 20   Blood pressure systolic appear to be in the 90s again this was very difficult to obtain blood sugar is 163 s  Physical Exam  GENERAL APPEARANCE: Alert, non conversant, No acute distress, initially had her eyes closed but with verbal stimuli does open her eyes--appears increasingly weak and frail compared to previous exam SKIN: No diaphoresis  Or rash HEENT: Did not really open her mouth today RESPIRATORY: Breathing is even, unlabored. Lung sounds are clear is poor respiratory effort she does not really follow verbal commands  CARDIOVASCULAR: Heart RRR no murmurs, rubs or gallops. No peripheral edema  GASTROINTESTINAL: Abdomen is somewhat firm,-- slightly tender diffuse, -- normal bowel sounds--.  GENITOURINARY: Bladder non tender, not distended  MUSCULOSKELETAL: contractures LE-- --other than arthritic changes and muscle wasting I do not note any acute deformities lower or upper extremities does hold her upper extremity is in contracted position but you can actually extend her arms  some  NEUROLOGIC: Cranial nerves 2-12 grossly intact. PSYCHIATRIC: dementia , no behavioral issues not following verbal commands day-  Patient Active Problem List   Diagnosis Date Noted  . FTT (failure  to thrive) in adult 01/24/2015  . Hyperglycemia 05/04/2014  . Rash and nonspecific skin eruption 04/12/2014  . Acute renal failure superimposed on stage 3 chronic kidney disease ([place]) 01/03/2014  . Glaucoma   . Dyspnea 12/30/2013  . Pain, generalized 12/30/2013  . DNR (do not resuscitate) 12/30/2013  . Palliative care encounter 12/30/2013  . AKI (acute kidney injury) ([place]) 12/29/2013  . Acute encephalopathy 12/29/2013  . Acute renal failure syndrome ([place])   . Encephalopathy acute   . CKD (chronic kidney disease) stage 3, GFR 30-59 ml/min 11/13/2013  . Cellulitis of arm, left 11/13/2013  . Protein-calorie malnutrition, severe ([place]) 11/02/2013  . Metabolic encephalopathy XX123456  . 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 Alzheimer's disease 08/29/2013  . Thyroid activity decreased 08/29/2013  . Arthritis, senescent 08/29/2013  . Esophageal reflux 08/29/2013  . Chronic constipation 08/29/2013  . Bipolar 1 disorder, depressed ([place]) 01/13/2013  . ARF (acute renal failure) ([place]) 01/13/2013  . Moderate protein-calorie malnutrition ([place]) 01/09/2013  . Normocytic anemia 01/09/2013  . Sacral decubitus ulcer, stage II 01/09/2013  . Hypernatremia 01/07/2013  . Dehydration 01/04/2013  . Hypokalemia 01/04/2013  . Post-surgical hypothyroidism 01/04/2013  . Dementia without behavioral disturbance 12/08/2012  . Unspecified vitamin D deficiency 07/21/2012  . Anemia in chronic kidney disease 06/19/2012  . Dyslipidemia 06/03/2012  . Osteoarthritis 06/03/2012  . Manic depressive disorder ([place]) 04/11/2012  . Unspecified constipation 04/11/2012  . GERD (gastroesophageal reflux disease) 04/11/2012  . Osteoporosis, unspecified 04/11/2012  . Diabetes insipidus -  lithium-induced 09/07/2011  . History of iron deficiency anemia 09/07/2011  . Acute blood loss anemia 09/06/2011  . Essential hypertension, benign 02/01/2011  . Depressive disorder, not  elsewhere classified 02/01/2011  . Hypothyroidism 02/01/2011    Labs.  04/22/2014.  Sodium 136 potassium 4.7 BUN 21 creatinine 0.75 glucose 293.  Albumin 3.22 otherwise liver function tests within normal limits.  Hemoglobin  9.3 platelets 2:30 WBC 4.6  CBC    Component Value Date/Time   WBC 4.6 04/22/2014   WBC 7.4 12/29/2013 0410   RBC 4.81 12/29/2013 0410   RBC 4.43 09/06/2011 1955   HGB 9.3* 04/22/2014   HCT 30* 04/22/2014   PLT 230 04/22/2014   MCV 111.2* 12/29/2013 0410   LYMPHSABS 1.7 12/28/2013 2023   MONOABS 0.6 12/28/2013 2023   EOSABS 0.1 12/28/2013 2023   BASOSABS 0.0 12/28/2013 2023    CMP     Component Value Date/Time   NA 136* 04/22/2014   NA 140 01/04/2014 0532   K 4.7 04/22/2014   CL 113* 01/04/2014 0532   CO2 23 01/04/2014 0532   GLUCOSE 94 01/04/2014 0532   BUN 21 04/22/2014   BUN 16 01/04/2014 0532   CREATININE 0.8 04/22/2014   CREATININE 0.95 01/04/2014 0532   CALCIUM 8.3* 01/04/2014 0532   CALCIUM 9.7 05/11/2008 0533   PROT 5.9* 01/02/2014 0507   ALBUMIN 2.9* 01/02/2014 0507   AST 20 04/22/2014   ALT 5* 04/22/2014   ALKPHOS 52 04/22/2014   BILITOT 2.2* 01/02/2014 0507   GFRNONAA 56* 01/04/2014 0532   GFRAA 65* 01/04/2014 0532    Assessment and Plan    #1-history of end-stage dementia--with failure to thrive which appears to be progressing-she is under hospice services   - She appears to be actively declining-I did speak with her hospice nurse-she reiterated desires for comfort care no aggressive interventions-we did discuss apainmanagement and will start Roxanol 5 mg sublingual every 4 hours when necessary-she does not appear to be uncomfortable at this time.  She does have a significant history of constipation on numerous agents I discuss this as well-this will be reevaluated tomorrow she is on an extensive number of agents at this time hospice is hesitant to increase this since she appears to be fairly  comfortable.    [id number] note greater than 25 minutes spent assessing patient-discussing her status with nursing staff as well as with the hospice nurse via phone-and coordinating plan of care-of note greater than 50% of time spent coordinating plan of care    Jaivon Vanbeek C, PA-C

## 2015-02-02 IMAGING — CT CT HEAD W/O CM
1 of 2 series · 14 of 30 positions shown, 18 images · non-contrast
Comparison: 05/07/2013 CT.

CLINICAL DATA: 79-year-old diabetic hypertensive female with
history of dementia presenting with increasing altered mental
status. Initial encounter.

EXAM:
CT HEAD WITHOUT CONTRAST
TECHNIQUE: Contiguous axial images were obtained from the base of the skull
through the vertex without intravenous contrast.

[Series 3: headseq 4.8 h45s · axial · 0.41mm/px · z∈[+1162,+1305]mm · 14 of 36 slices shown, 18 images]
[im 3/36  brain]
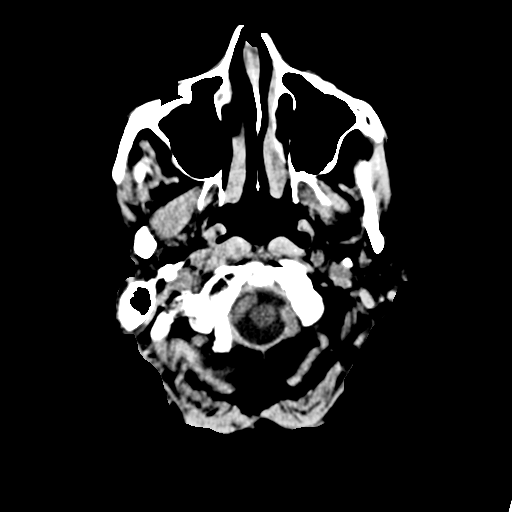
[im 3/36  bone]
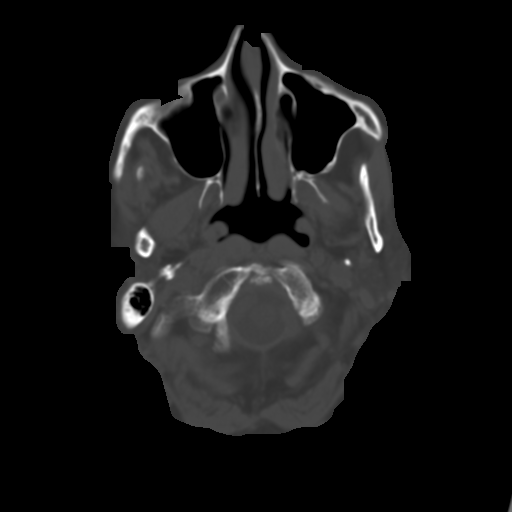
[im 5/36  brain]
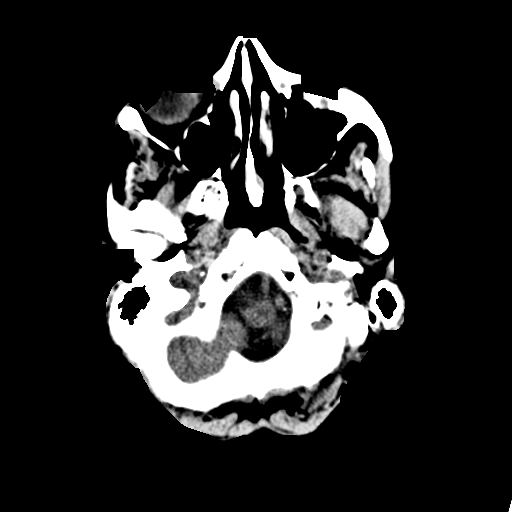
[im 8/36  brain]
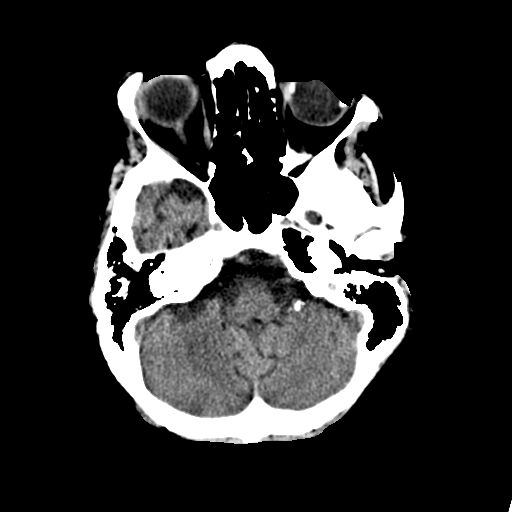
[im 10/36  brain]
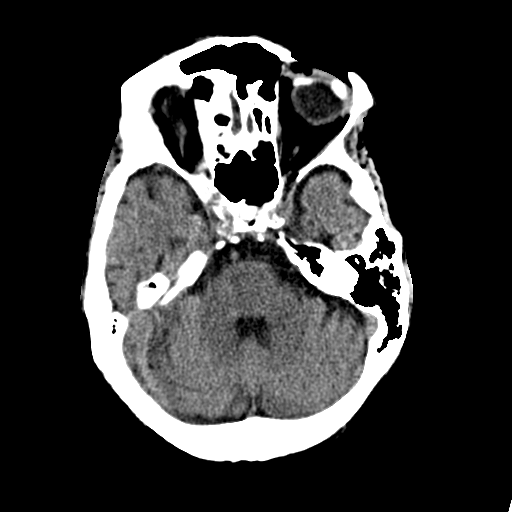
[im 12/36  brain]
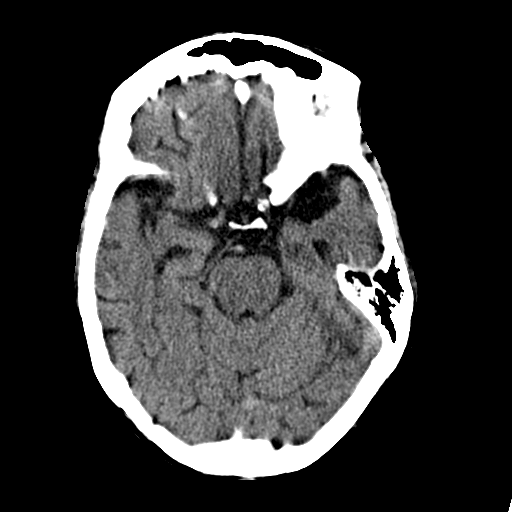
[im 12/36  bone]
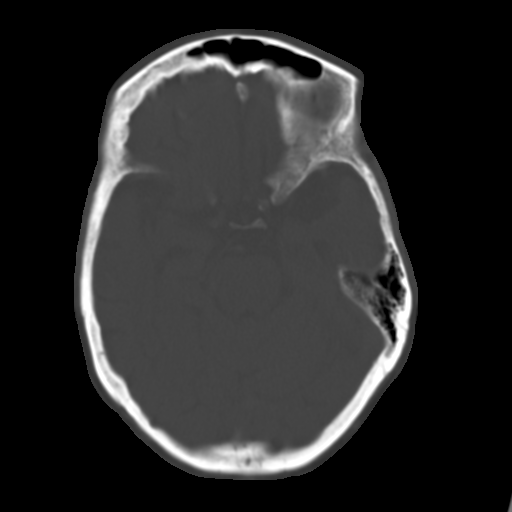
[im 15/36  brain]
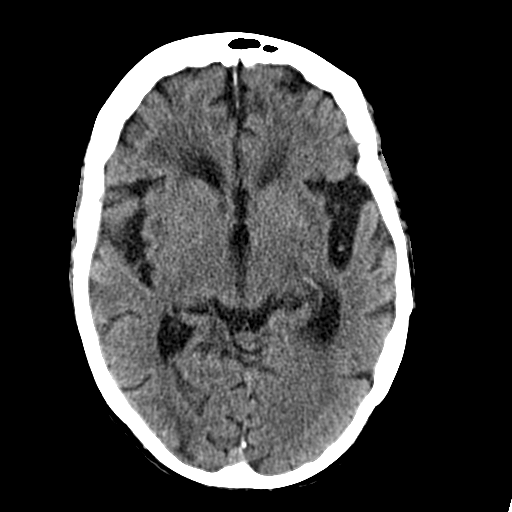
[im 17/36  brain]
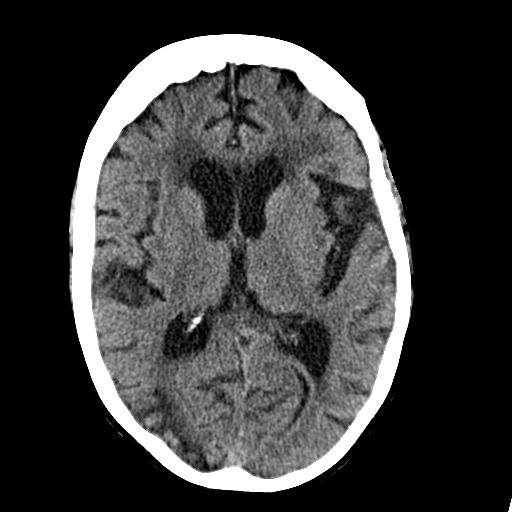
[im 19/36  brain]
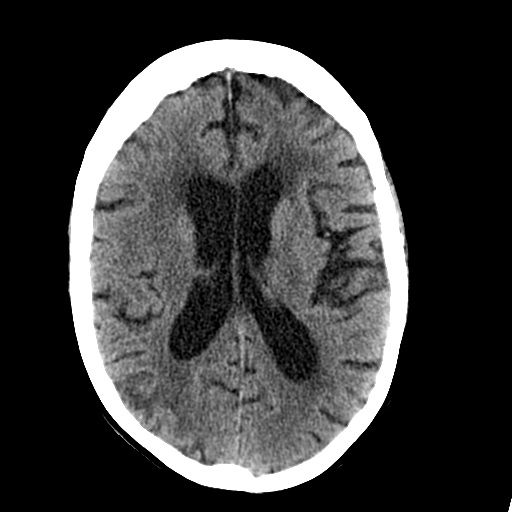
[im 22/36  brain]
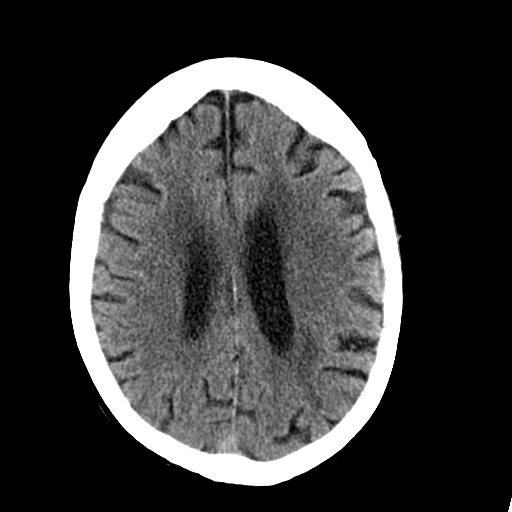
[im 22/36  bone]
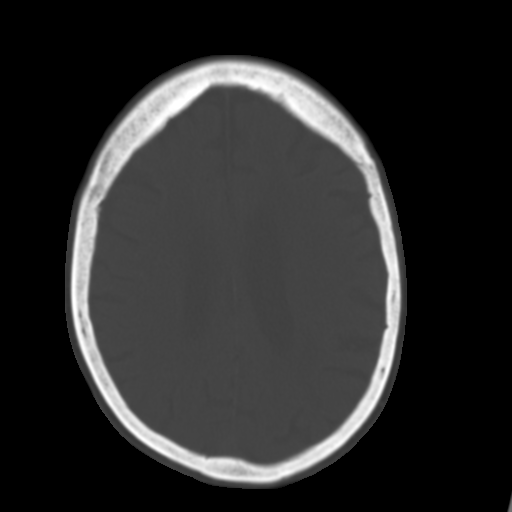
[im 24/36  brain]
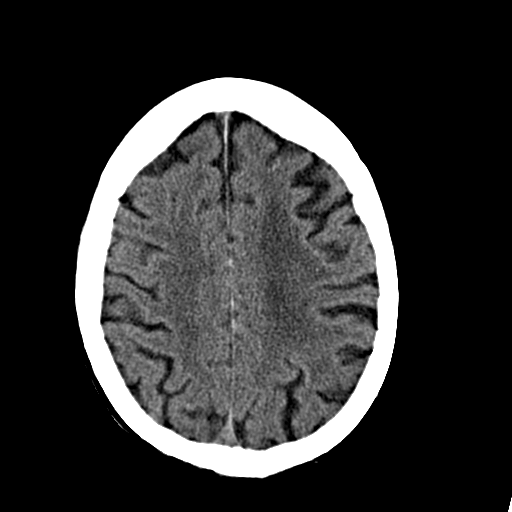
[im 26/36  brain]
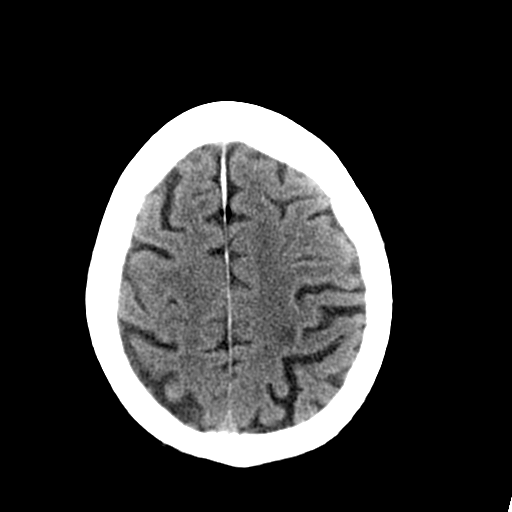
[im 29/36  brain]
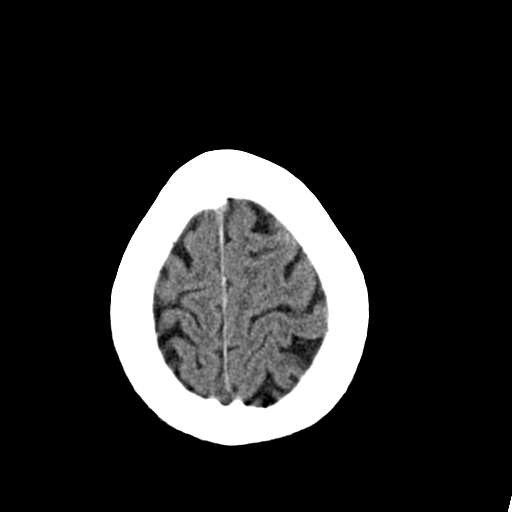
[im 31/36  brain]
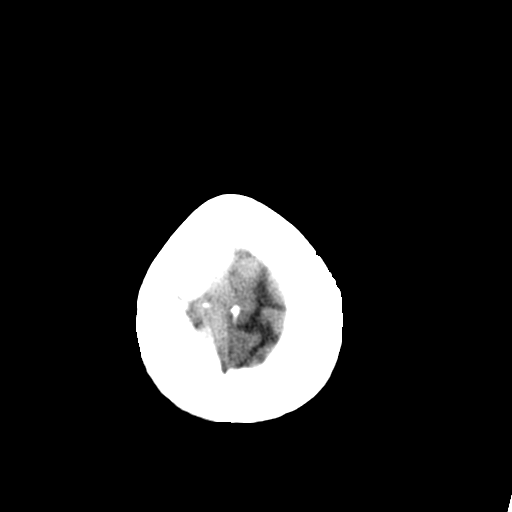
[im 31/36  bone]
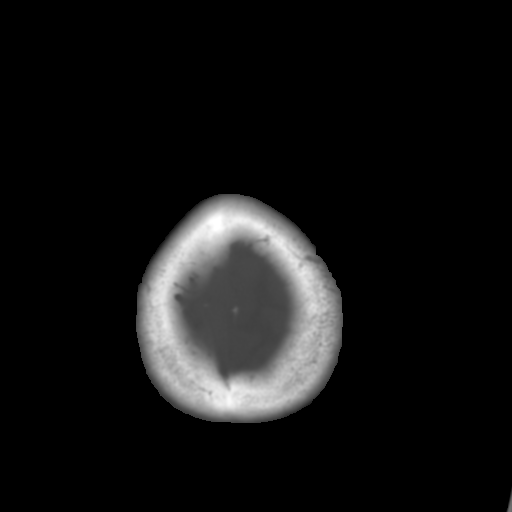
[im 33/36  brain]
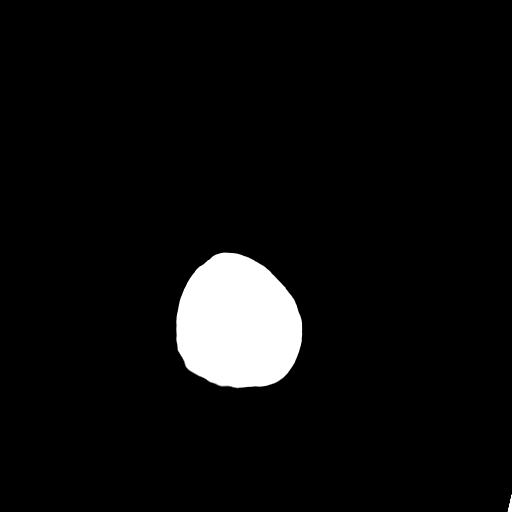

[14 of 30 positions shown; findings below may reference images not displayed]

FINDINGS: New from the prior examination and of indeterminate is a right
parietal-occipital lobe infarct. There may be laminar
necrosis/petechial hemorrhage along the periphery of this infarct.

Small vessel disease type changes.

Atrophy without hydrocephalus.

No intracranial mass lesion noted on this unenhanced exam.

Vascular calcifications.

Banding left globe.

Mastoid air cells, middle ear cavities and visualized paranasal
sinuses are clear. No calvarial abnormality.
IMPRESSION: New from the prior examination and of indeterminate is a right
parietal-occipital lobe infarct. There may be laminar
necrosis/petechial hemorrhage along the periphery of this infarct.

Small vessel disease type changes.

Atrophy without hydrocephalus.

## 2015-02-09 DEATH — deceased
